# Patient Record
Sex: Female | Born: 1955
Health system: Southern US, Community
[De-identification: ages and names within clinical notes are randomized; demographics above are authoritative.]

## PROBLEM LIST (undated history)

## (undated) DIAGNOSIS — M069 Rheumatoid arthritis, unspecified: Secondary | ICD-10-CM

## (undated) DIAGNOSIS — F419 Anxiety disorder, unspecified: Secondary | ICD-10-CM

## (undated) DIAGNOSIS — M1990 Unspecified osteoarthritis, unspecified site: Secondary | ICD-10-CM

## (undated) DIAGNOSIS — Z87442 Personal history of urinary calculi: Secondary | ICD-10-CM

## (undated) DIAGNOSIS — R1900 Intra-abdominal and pelvic swelling, mass and lump, unspecified site: Secondary | ICD-10-CM

## (undated) DIAGNOSIS — K219 Gastro-esophageal reflux disease without esophagitis: Secondary | ICD-10-CM

## (undated) HISTORY — DX: Personal history of urinary calculi: Z87.442

## (undated) HISTORY — PX: EXPLORATORY LAPAROTOMY: SUR591

## (undated) HISTORY — DX: Unspecified osteoarthritis, unspecified site: M19.90

## (undated) HISTORY — PX: BACK SURGERY: SHX140

## (undated) HISTORY — DX: Anxiety disorder, unspecified: F41.9

## (undated) HISTORY — DX: Gastro-esophageal reflux disease without esophagitis: K21.9

## (undated) HISTORY — DX: Intra-abdominal and pelvic swelling, mass and lump, unspecified site: R19.00

## (undated) HISTORY — PX: APPENDECTOMY: SHX54

## (undated) HISTORY — PX: ABDOMINAL HYSTERECTOMY: SHX81

## (undated) HISTORY — PX: GASTRIC BYPASS: SHX52

## (undated) HISTORY — PX: LITHOTRIPSY: SUR834

## (undated) HISTORY — PX: KNEE ARTHROSCOPY: SUR90

## (undated) HISTORY — PX: CHOLECYSTECTOMY: SHX55

## (undated) HISTORY — DX: Rheumatoid arthritis, unspecified: M06.9

---

## 1997-09-01 ENCOUNTER — Inpatient Hospital Stay (HOSPITAL_COMMUNITY): Admission: RE | Admit: 1997-09-01 | Discharge: 1997-09-02 | Payer: Self-pay | Admitting: Neurosurgery

## 1997-10-20 ENCOUNTER — Ambulatory Visit (HOSPITAL_COMMUNITY): Admission: RE | Admit: 1997-10-20 | Discharge: 1997-10-20 | Payer: Self-pay | Admitting: Neurosurgery

## 1997-10-21 ENCOUNTER — Inpatient Hospital Stay (HOSPITAL_COMMUNITY): Admission: AD | Admit: 1997-10-21 | Discharge: 1997-11-01 | Payer: Self-pay | Admitting: Neurosurgery

## 1997-11-25 ENCOUNTER — Encounter: Admission: RE | Admit: 1997-11-25 | Discharge: 1997-11-25 | Payer: Self-pay | Admitting: Internal Medicine

## 1997-12-27 ENCOUNTER — Encounter: Admission: RE | Admit: 1997-12-27 | Discharge: 1997-12-27 | Payer: Self-pay | Admitting: Internal Medicine

## 1998-01-19 ENCOUNTER — Encounter: Admission: RE | Admit: 1998-01-19 | Discharge: 1998-01-19 | Payer: Self-pay | Admitting: Internal Medicine

## 1998-04-22 ENCOUNTER — Ambulatory Visit (HOSPITAL_COMMUNITY): Admission: RE | Admit: 1998-04-22 | Discharge: 1998-04-22 | Payer: Self-pay | Admitting: Neurosurgery

## 1999-08-17 ENCOUNTER — Ambulatory Visit: Admission: RE | Admit: 1999-08-17 | Discharge: 1999-08-17 | Payer: Self-pay | Admitting: Family Medicine

## 2000-11-27 ENCOUNTER — Encounter: Admission: RE | Admit: 2000-11-27 | Discharge: 2000-11-27 | Payer: Self-pay | Admitting: Family Medicine

## 2000-11-27 ENCOUNTER — Encounter: Payer: Self-pay | Admitting: Family Medicine

## 2001-01-22 ENCOUNTER — Ambulatory Visit (HOSPITAL_COMMUNITY): Admission: RE | Admit: 2001-01-22 | Discharge: 2001-01-22 | Payer: Self-pay | Admitting: Gastroenterology

## 2001-01-22 ENCOUNTER — Encounter: Payer: Self-pay | Admitting: Gastroenterology

## 2002-08-26 ENCOUNTER — Other Ambulatory Visit: Admission: RE | Admit: 2002-08-26 | Discharge: 2002-08-26 | Payer: Self-pay | Admitting: Gynecology

## 2003-07-12 ENCOUNTER — Encounter: Admission: RE | Admit: 2003-07-12 | Discharge: 2003-07-12 | Payer: Self-pay | Admitting: Internal Medicine

## 2003-10-03 ENCOUNTER — Other Ambulatory Visit: Admission: RE | Admit: 2003-10-03 | Discharge: 2003-10-03 | Payer: Self-pay | Admitting: Gynecology

## 2004-07-26 ENCOUNTER — Ambulatory Visit: Payer: Self-pay | Admitting: Internal Medicine

## 2004-07-26 ENCOUNTER — Encounter: Admission: RE | Admit: 2004-07-26 | Discharge: 2004-10-24 | Payer: Self-pay | Admitting: Surgery

## 2004-07-26 ENCOUNTER — Ambulatory Visit (HOSPITAL_BASED_OUTPATIENT_CLINIC_OR_DEPARTMENT_OTHER): Admission: RE | Admit: 2004-07-26 | Discharge: 2004-07-26 | Payer: Self-pay | Admitting: Surgery

## 2004-07-27 ENCOUNTER — Ambulatory Visit (HOSPITAL_COMMUNITY): Admission: RE | Admit: 2004-07-27 | Discharge: 2004-07-27 | Payer: Self-pay | Admitting: Surgery

## 2004-08-01 ENCOUNTER — Ambulatory Visit: Admission: RE | Admit: 2004-08-01 | Discharge: 2004-08-01 | Payer: Self-pay | Admitting: Surgery

## 2004-08-01 ENCOUNTER — Encounter: Payer: Self-pay | Admitting: Cardiovascular Disease

## 2004-08-01 ENCOUNTER — Ambulatory Visit: Payer: Self-pay | Admitting: Cardiovascular Disease

## 2004-10-02 ENCOUNTER — Inpatient Hospital Stay (HOSPITAL_COMMUNITY): Admission: RE | Admit: 2004-10-02 | Discharge: 2004-10-05 | Payer: Self-pay | Admitting: Surgery

## 2004-11-15 ENCOUNTER — Encounter: Admission: RE | Admit: 2004-11-15 | Discharge: 2005-02-13 | Payer: Self-pay | Admitting: Surgery

## 2004-11-16 ENCOUNTER — Other Ambulatory Visit: Admission: RE | Admit: 2004-11-16 | Discharge: 2004-11-16 | Payer: Self-pay | Admitting: Gynecology

## 2005-03-26 ENCOUNTER — Encounter: Admission: RE | Admit: 2005-03-26 | Discharge: 2005-06-24 | Payer: Self-pay | Admitting: Surgery

## 2005-11-19 ENCOUNTER — Other Ambulatory Visit: Admission: RE | Admit: 2005-11-19 | Discharge: 2005-11-19 | Payer: Self-pay | Admitting: Gynecology

## 2007-04-14 ENCOUNTER — Other Ambulatory Visit: Admission: RE | Admit: 2007-04-14 | Discharge: 2007-04-14 | Payer: Self-pay | Admitting: Gynecology

## 2007-04-15 ENCOUNTER — Encounter: Admission: RE | Admit: 2007-04-15 | Discharge: 2007-04-15 | Payer: Self-pay | Admitting: Gynecology

## 2007-05-05 ENCOUNTER — Encounter (INDEPENDENT_AMBULATORY_CARE_PROVIDER_SITE_OTHER): Payer: Self-pay | Admitting: Gynecology

## 2007-05-05 ENCOUNTER — Inpatient Hospital Stay (HOSPITAL_COMMUNITY): Admission: RE | Admit: 2007-05-05 | Discharge: 2007-05-07 | Payer: Self-pay | Admitting: Gynecology

## 2007-09-21 ENCOUNTER — Encounter: Admission: RE | Admit: 2007-09-21 | Discharge: 2007-09-21 | Payer: Self-pay | Admitting: Occupational Medicine

## 2007-11-04 ENCOUNTER — Encounter: Admission: RE | Admit: 2007-11-04 | Discharge: 2007-11-04 | Payer: Self-pay | Admitting: Rheumatology

## 2008-07-05 ENCOUNTER — Encounter: Admission: RE | Admit: 2008-07-05 | Discharge: 2008-07-05 | Payer: Self-pay | Admitting: Gynecology

## 2010-04-18 ENCOUNTER — Encounter: Admission: RE | Admit: 2010-04-18 | Discharge: 2010-04-18 | Payer: Self-pay | Admitting: Occupational Medicine

## 2010-07-13 ENCOUNTER — Other Ambulatory Visit: Payer: Self-pay | Admitting: Gynecology

## 2010-07-13 DIAGNOSIS — Z1231 Encounter for screening mammogram for malignant neoplasm of breast: Secondary | ICD-10-CM

## 2010-07-20 ENCOUNTER — Ambulatory Visit
Admission: RE | Admit: 2010-07-20 | Discharge: 2010-07-20 | Disposition: A | Discharge status: Home / Self Care | Payer: Managed Care, Other (non HMO) | Source: Ambulatory Visit | Attending: Gynecology | Admitting: Gynecology

## 2010-07-20 DIAGNOSIS — Z1231 Encounter for screening mammogram for malignant neoplasm of breast: Secondary | ICD-10-CM

## 2010-10-02 NOTE — H&P (Signed)
NAME:  Cassidy Reeves, Cassidy Reeves                 ACCOUNT NO.:  [id number]   MEDICAL RECORD NO.:  [id number]          PATIENT TYPE:  INP   LOCATION:  1523                         FACILITY:  [facility]   PHYSICIAN:  Gretta Cool, M.D. DATE OF BIRTH:  03-Mar-1956   DATE OF ADMISSION:  05/05/2007  DATE OF DISCHARGE:  04/28/2007                              HISTORY & PHYSICAL   CHIEF COMPLAINT:  Pelvic mass.   HISTORY OF PRESENT ILLNESS:  A 55 year old G2, P2 presented for annual  gynecologic examination, was noted to have a pelvic mass.  Ultrasound  examination revealed a complex pelvic mass with multiple loculations  measuring 6.8 x 41 x 66 cm.  The mass appeared to be smoothly marginated  with no evidence of excrescences or anything compatible with advanced  ovarian pathology.  CA-125 was determined to be 10.2.  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   In retrospect, the patient recalls some history of pelvic pressure for  at least six months duration.  She denies any other symptoms.  She has  no evidence of ascites or ay other secondary evidence of malignancy.  She is now admitted for definitive therapy by laparotomy, possible  omentectomy, possible lymph node sampling.  She understands that she may  need to be seen or may need to have laparoscopic lymphadenectomy in the  event that this should be more than a benign process.   PAST MEDICAL HISTORY:  1. Usual childhood disease without sequelae  2. History of morbid obesity.  3. History of gastric bypass.  Has lost greater than 100 pounds from      her prebypass weight of approximately 283 pounds.  She has no other      significant medical history.   PAST OBSTETRICS HISTORY:  Two vaginal deliveries, uneventful.   FAMILY HISTORY:  Father has pacemaker, psoriasis, arthritis,  Parkinson's.  Mom is living and well.  Paternal grandfather had colon  cancer.  Maternal grandmother type 2 diabetes.  No other known  familial  tendencies.   HABITS:  Denies ethanol or tobacco.  She is on low-dose hormone  replacement with Climara patch.  She is on Enbrel, autoimmune  suppression for psoriasis.   REVIEW OF SYSTEMS:  HEENT:  Denies symptoms.  CARDIORESPIRATORY:  Denies  asthma, cough, bronchitis, shortness of breath.  GI:  Denies frequency,  urgency, dysuria, change in bowel habits, food intolerance.   PHYSICAL EXAMINATION:  GENERAL:  Well-developed, well-nourished white  female.  VITAL SIGNS:  Current weight of 176 pounds, BMI 29.  HEENT:  Pupils equal, round and reactive to light and accommodations.  Fundi not examined.  Oropharynx clear.  NECK:  Supple without mass or thyroid enlargement.  CHEST:  Clear to percussion and auscultation.  HEART:  Regular rate and rhythm without murmur or cardiac enlargement.  BREASTS:  Without mass, nodes or nipple discharge.  ABDOMEN:  Soft without mass or organomegaly.  Laparoscopic scar is  noted.  PELVIC:  External genitalia normal female vagina, diminished estrogen  effect.  Cervix and uterus surgically absent.  There  is a palpable mass  in the right lower quadrant that is approximately 6 cm in dimension  Ultrasound revealed a mass above with multiple internal loculations  compatible with mucinous cyst adenoma.  No free fluid.  No other  evidence of extent.  The right to vaginal confirms.  EXTREMITIES:  Negative in neurologic and physiologic.   IMPRESSION:  1. 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  2. Psoriasis on Enbrel immunosuppressive therapy.  3. History of morbid obesity, now post gastric bypass with 130 pound      weight loss.  4. History of hysterectomy and left salpingo-oophorectomy, Burch      posterior and enterocele  repairs in 1995.           ______________________________  Gretta Cool, M.D.     CWL/MEDQ  D:  05/05/2007  T:  05/05/2007   Job:  [id number]   cc:   Gaspar Garbe, M.D.  Fax: [phone]

## 2010-10-02 NOTE — Op Note (Signed)
NAME:  Cassidy Reeves, Cassidy Reeves                 ACCOUNT NO.:  [id number]   MEDICAL RECORD NO.:  [id number]          PATIENT TYPE:  INP   LOCATION:  1523                         FACILITY:  [facility]   PHYSICIAN:  Gretta Cool, M.D. DATE OF BIRTH:  1955/12/31   DATE OF PROCEDURE:  05/05/2007  DATE OF DISCHARGE:                               OPERATIVE REPORT   PREOPERATIVE DIAGNOSIS:  Right ovarian mass.   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   PROCEDURE:  1. Exploratory laparotomy.  2. Lysis of adhesions.  3. Right salpingo-oophorectomy.  4. Pathology consult and frozen section.   SURGEON:  Beather Arbour, MD   ASSISTANT:  Jeanine Luz, MD   ANESTHESIA:  General orotracheal.   DESCRIPTION OF PROCEDURE:  Under excellent general anesthesia, with the  patient prepped and draped in supine position, with her bladder drained  by a Foley catheter.  A submental incision was made from the umbilicus  to the symphysis.  The incision was then extended through the fascia,  the peritoneum opened and the abdomen explored.  There were no  abnormalities identified in the upper abdomen, no evidence of omental  spread of metastasis.  Peritoneal washings were obtained, but not  submitted after the frozen section.  At this point, the retractors were  placed, the bowel packed away and the right adnexal structure  identified.  It was significantly adherent to the right lateral pelvic  wall and to the pelvic floor.  Procedure was begun by transecting the  right round ligament to which the ovary was attached by cautery.  The  peritoneum was then opened and the infundibulopelvic vessels exposed.  The blood vessels were then clamped, cut, sutured and tied with 0  Vicryl.  A 2nd free tie was used to doubly ligate pedicle.  At this  point, the cautery was used to transect the adhesions adherent to the  ovarian mass.  Once the adhesions were sufficiently lysed and the mass  removed, the tissue was  submitted for pathology consult.  At this point,  all the bleeding points were coagulated and the peritoneal defect  closed, but with running suture of #2-0 Monocryl.  At this point, the  frozen section report was returned that revealed a thecoma of the right  ovary, but no evidence of malignancy.  At this point, the packs and  retractors were removed.  The peritoneum was closed with a running  suture of #0 Monocryl.  The fascia was approximated then with a running  suture of #0 PDS from each angle to the midline.  The subcutaneous  tissues were approximated then including Scarpa's fascia with  interrupted sutures of 3-0 Vicryl and skin closed with skin staples and  Steri-Strips.  At the end of the procedure, sponge and lap counts were  correct.  There were no complications.  The patient returned to the  recovery room in excellent condition.   TISSUE REMOVED:  Right tube and ovarian mass.   FROZEN SECTION:  Thecoma.   COMPLICATIONS:  None.   ESTIMATED BLOOD LOSS:  Minimal.  ______________________________  Gretta Cool, M.D.     CWL/MEDQ  D:  05/05/2007  T:  05/06/2007  Job:  [id number]   cc:   Almedia Balls. Randell Patient, M.D.  Fax: [phone]   Gaspar Garbe, M.D.  Fax: [phone]

## 2010-10-05 NOTE — Discharge Summary (Signed)
NAME:  AVYNN, KLASSEN NO.:  [id number]   MEDICAL RECORD NO.:  [id number]          PATIENT TYPE:  INP   LOCATION:  1523                         [facility]ACILITY:  [facility]   PHYSICIAN:  Gretta Cool, M.D. DATE OF BIRTH:  October 19, 1955   DATE OF ADMISSION:  05/05/2007  DATE OF DISCHARGE:  05/07/2007                               DISCHARGE SUMMARY   HISTORY OF PRESENT ILLNESS:  Ms. Cassidy Reeves is a 55 year old white female  gravida 2, para 2 who on her annual gynecologic exam was noted to have a  pelvic mass.  The ultrasound evaluation measured this complex pelvic  mass with multiple loculations at 6.8 x 41 x 66 cm.  The mass appeared  to be smooth with no evidence of anything compatible with advanced  ovarian pathology.  CA-125 was 10.2.  The appearance appears to be  compatible with a mucinous cyst adenoma.   REVIEW OF SYSTEMS:  The patient recalls pelvic pressure for about 6  months.  She denies any other symptoms.   PHYSICAL EXAMINATION:  GENERAL:  There was no evidence of ascites.  She  is now admitted for definitive therapy by laparotomy and possible  omentectomy, possible lymph node sampling.  CHEST:  Clear to A & P.  HEART:  Rate and rhythm were regular without murmur, gallop, cardiac  enlargement.  BREASTS:  Soft without masses.  No nipple  discharge.  ABDOMEN:  Soft without masses or organomegaly.  Laparoscopic scar is  noted.  PELVIC:  External genitalia within normal limits for female with  diminished estrogen effect.  The cervix and uterus are surgically  absent.  There is a palpable mass in the right lower quadrant that is  approximately 6 cm in dimension.  Left adnexa was clear.  Rectovaginal  exam confirms.   IMPRESSION:  1. 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  2. Psoriasis on Enbrel for immunosuppressive therapy.  3. History of morbid obesity, now post gastric bypass  surgery with a      130-pound weight loss.  4. History of hysterectomy and left salpingo-oophorectomy.  Posterior      enterocele was repaired in 1995.  Risks and benefits were discussed      with the patient and she accepts these procedures.   LABORATORY DATA:  On admission, hemoglobin 12.9, hematocrit 36.5.  On  the first postoperative day, hemoglobin was 12.0, hematocrit 34.1.  Remainder of her preoperative labs were within normal limits.  ECG  showed a ventricular rate of 69.   HOSPITAL COURSE:  The patient underwent exploratory laparotomy with  lysis of adhesions and right salpingo-oophorectomy under general  anesthesia.  The procedures were completed without any complications and  the patient was returned to the recovery room in excellent condition.  Pathology report showed a hemorrhagic corpus luteal cyst and hemorrhagic  follicular cyst on the left ovary, unremarkable fallopian tube,  edematous ovarian stroma.  Malignant features were not identified.  Rapid intraoperative diagnosis of right ovary.  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  No evidence of  carcinoma.  Her postoperative course was without complications, and the  patient was discharged on the second postoperative day in excellent  condition.  Final discharge instructions included no heavy lifting or  straining, no vaginal entrance, increase ambulation as tolerated.  She  is to call for any fever over 100.4 or failure of daily improvement.  She may shower or tub bath every 72 hours.   MEDICATIONS:  1. She may resume any preoperative medications.  2. Celebrex 200 mg one tablet daily.  3. Percocet one tablet 5/325 q.3-4 h p.r.n. discomfort.  4. Continue estrogen patch as prescribed.   FOLLOWUP:  She is to return to the office in 5 days for follow-up.   CONDITION ON DISCHARGE:  Excellent.   DISCHARGE DIAGNOSIS:  Pelvic mass - thecoma by frozen section.   PROCEDURES PERFORMED:   Exploratory laparotomy, lysis of adhesions, right  salpingo-oophorectomy, pathology consult and frozen section.      Matt Holmes, N.P.    ______________________________  Gretta Cool, M.D.    EMK/MEDQ  D:  07/08/2007  T:  07/09/2007  Job:  [id number]

## 2010-10-05 NOTE — Discharge Summary (Signed)
NAME:  Cassidy Reeves, Cassidy Reeves                 ACCOUNT NO.:  [id number]   MEDICAL RECORD NO.:  [id number]          PATIENT TYPE:  INP   LOCATION:  0163                         FACILITY:  [facility]   PHYSICIAN:  Thornton Park. Daphine Deutscher, MD  DATE OF BIRTH:  1955-11-15   DATE OF ADMISSION:  10/02/2004  DATE OF DISCHARGE:                                 DISCHARGE SUMMARY   ADMISSION DIAGNOSES:  Morbid obesity with BMI approximately 47.   DISCHARGE DIAGNOSES:  1.  Morbid obesity with BMI approximately 47.  2.  Status post laparoscopic roux-Y gastric bypass, 40 cm biliopancreatic      limb, 100 cm antecolic roux limb with candy cane left and closure of      Pearson's defect.   HOSPITAL COURSE:  [name] is a 55 year old white female who underwent  the above mentioned operation on Oct 02, 2004 from which she seemed to do  well. She had a drain left in. Because of bed availability, she ended up  staying in the step down unit the whole time. Her swallow showed that pouch  emptied although it was a little emptying. She complained of some left upper  quadrant pain which went away when I removed her drain which had migrated  beneath her left hemidiaphragm. She was given Roxicet for pain. She was  ready for discharge on Friday, Oct 05, 2004. She was asked to return on  Monday for staple removal as I just removed her drain.   CONDITION ON DISCHARGE:  Good.      MBM/MEDQ  D:  10/05/2004  T:  10/05/2004  Job:  [id number]   cc:   Gretta Cool, M.D.  [address] [place]  [place] [address]  Fax: [phone]   Gaspar Garbe, M.D.  [address]  [place]  [place] [address]  Fax: [phone]

## 2010-10-05 NOTE — Procedures (Signed)
NAME:  Cassidy Reeves, Cassidy Reeves                 ACCOUNT NO.:  [id number]   MEDICAL RECORD NO.:  [id number]          PATIENT TYPE:  OUT   LOCATION:  SLEEP CENTER                 FACILITY:  [facility]   PHYSICIAN:  Clinton D. Maple Hudson, M.D. DATE OF BIRTH:  [birth year]   DATE OF STUDY:  07/27/2004                              NOCTURNAL POLYSOMNOGRAM   REFERRING PHYSICIAN:  Thornton Park. Daphine Deutscher, MD   INDICATIONS FOR STUDY:  Hypersomnia with sleep apnea. Epworth sleepiness  score 5/24, BMI 45, weight 282 pounds. Neck size 18 inches.   SLEEP ARCHITECTURE:  Total sleep time 268 minutes with sleep efficiency of  63%. Stage I was 23%, stage II was 59%, stages III and IV were 9%, REM was  9% of total sleep time. Latency to sleep onset 14 minutes. Latency to REM  173 minutes. Awake after sleep onset 142 minutes. Arousal index 23.   RESPIRATORY DATA:  Split study protocol. Respiratory disturbance index (RDI)  52.4 obstructive events per hour indicating severe obstructive sleep  apnea/hypopnea before CPAP. There were 7 obstructive apneas and 103  hypopneas before CPAP. The events were not positional. REM RDI was 10. CPAP  was titrated to 10 CWP, RDI 8.1 per hour. A medium Respironics Comfort Full  Face mask was used. Sleep was fragmented and she had difficulty maintaining  sleep initially on CPAP, which limited time for titration.   OXYGEN DATA:  Mild snoring with oxygen desaturation to a nadir of 79% before  CPAP. After CPAP control, oxygen saturation held 93% to 96% on room air.   CARDIAC DATA:  Normal sinus rhythm with rare PVC.   MOVEMENT/PARASOMNIA:  A total of 64 limb jerks were recorded of which 10  were associated with arousal or awakening of a periodic limb movement with  arousal index of 2.2 per hour which is mildly increased.   IMPRESSION/RECOMMENDATIONS:  1.  Severe obstructive sleep apnea/hypopnea syndrome, RDI 52.4 per hour with      oxygen desaturation to 79%.  2.  CPAP titration to 10 CWP, RDI 8.1  per hour using a medium Respironics      Comfort Full Face mask with heated humidifier. She had some anxiety and      difficulty initially adjusting to CPAP and may benefit from a      sleep medication during initial home use.  3.  Mild periodic limb movement with arousal, 2.2 per hour.      CDY/MEDQ  D:  07/29/2004 11:10:46  T:  07/30/2004 09:37:52  Job:  [id number]

## 2010-10-05 NOTE — Procedures (Signed)
[place]  Patient:    Cassidy Reeves, Cassidy Reeves. Visit Number: [id number] MRN: [id number]          Service Type: Attending:  Verlin Grills, M.D. Dictated by:   Verlin Grills, M.D. Proc. Date: 01/22/01   CC:         Al Decant. Janey Greaser, M.D.  Gretta Cool, M.D.   Procedure Report  DATE OF BIRTH:  Sep 30, 1955  REFERRING PHYSICIAN: 1. Al Decant. Janey Greaser, M.D. 2. Gretta Cool, M.D.  PROCEDURE PERFORMED:  Flexible proctosigmoidoscopy.  ENDOSCOPIST:  Verlin Grills, M.D.  INDICATIONS FOR PROCEDURE:  The patient is a 55 year old female with chronic left lower quadrant abdominal pain.  Cassidy Reeves has undergone a cholecystectomy, appendectomy, total abdominal hysterectomy, and left oophorectomy in the past.  When Cassidy Reeves developed left lower quadrant abdominal pain she was evaluated by Dr. Beather Arbour and underwent a transvaginal ultrasound; she was told the ultrasound was normal.  On December 01, 2000 her CBC with differential was normal. Her November 27, 2000 CT scan of the abdomen and pelvis was normal, post-hysterectomy and left oophorectomy.  Cassidy Reeves adolescent son has irritable bowel syndrome and she is quite familiar with the symptoms of irritable bowel syndrome.  She does not have similar symptoms.  Her pain is always in the left lower quadrant and left flank area.  The pain is not brought on by food intake and is not associated with bowel urgency, diarrhea, constipation.  Her pain is not relieved with bowel movement.  MEDICATION ALLERGIES:  PENICILLIN.  CURRENT MEDICATIONS:  Prozac and clorazepate.  PAST MEDICAL HISTORY:  Psoriasis, kidney stones, back surgery, total abdominal hysterectomy with left oophorectomy, cholecystectomy, appendectomy, dislocated left elbow.  PREMEDICATION:  Versed 10 mg, fentanyl 100 mcg.  ENDOSCOPE:  Olympus video pediatric colonoscope.  DESCRIPTION OF PROCEDURE:  After  obtaining informed consent, the patient was placed in the left lateral decubitus position.  I administered intravenous fentanyl and intravenous Versed to achieve conscious sedation for the procedure.  The patients blood pressure, oxygen saturation and cardiac rhythm were monitored throughout the procedure and documented in the medical record.  Anal inspection was normal.  Digital rectal exam was normal.  The Olympus pediatric video colonoscope was then introduced into the rectum advanced to approximately 30 cm from the anal verge.  Due to sigmoid colon loop formation, I was unable to advance the endoscope more proximally and a complete colonoscopy was not performed.  Endoscopic appearance of the rectum and distal sigmoid colon with the endoscope advanced to 30 cm from the anal verge was completely normal.  There was no endoscopic evidence for the presence of colorectal neoplasia or inflammatory bowel disease.  ASSESSMENT:  Normal flexible proctosigmoidoscopy performed to 30 cm from the anal verge.  PLAN:  I will schedule the patient for an air contrast barium enema to be performed at [facility] later this morning.  I suspect her gastrointestinal evaluation will be normal.  I have no explanation for her left lower quadrant abdominal pain.  The next step might require a diagnostic laparoscopy with adhesion lysis. Dictated by:   Verlin Grills, M.D. Attending:  Verlin Grills, M.D. DD:  01/22/01 TD:  01/22/01 Job: [id number]

## 2010-10-05 NOTE — Op Note (Signed)
NAME:  Cassidy Reeves, Cassidy Reeves                 ACCOUNT NO.:  [id number]   MEDICAL RECORD NO.:  [id number]          PATIENT TYPE:  INP   LOCATION:  0001                         FACILITY:  [facility]   PHYSICIAN:  Thornton Park. Daphine Deutscher, MD  DATE OF BIRTH:  [birth year]   DATE OF PROCEDURE:  10/02/2004  DATE OF DISCHARGE:                                 OPERATIVE REPORT   PREOPERATIVE DIAGNOSIS:  Morbid obesity.   POSTOPERATIVE DIAGNOSIS:  Morbid obesity.   PROCEDURE:  Laparoscopic Roux-en-Y gastric bypass, with 40 cm  biliopancreatic limb and 100 cm Roux limb, antecolic candy cane left, with  closure of Peterson's defect.   SURGEON:  Thornton Park. Daphine Deutscher, M.D.   ASSISTANT:  Sandria Bales. Ezzard Standing, M.D.   ANESTHESIA:  General endotracheal.   DRAINS:  One JP.   DESCRIPTION OF PROCEDURE:  Jamilet Ambroise is a 55 year old lady.  Extensive  informed consent was obtained regarding laparoscopic as well as open gastric  bypass.  She has had a previous open cholecystectomy and a vaginal  hysterectomy.  I entered the abdomen through the left upper quadrant without  difficulty and encountered a large massive sea of omental fat that was stuck  up all along her previous cholecystectomy incision and through her drain  site in the right lower quadrant.  I placed two other trocars, one just to  the left of the umbilicus for the camera, and through that I then used a  Harmonic to try to take down these and free this up.  I took down the  omentum all the way over to the gallbladder, where it was stuck up into the  gallbladder fossa.  I then placed two trocars in the right upper quadrant  and began first trying to mobilize the omentum.  The omentum was massive and  really completely obscured the small bowel.  I was able to pull that up, and  there work my way back in front of the ligament of Treitz which I identified  on two occasions.  I went forward 40 cm and divided the small bowel with two  applications of the Endo-GIA 60.  I  put a Penrose drain on the Roux limb  distal portion of this division and then held the biliopancreatic limb while  I counted 100 cm downstream.  There, I lay the biliopancreatic limb parallel  to the Roux limb and tacked them with a suture.  I opened them along their  antimesenteric edges and then inserted an Endo-GIA 60 with a white cartridge  and fired this.  The common defect was closed with two sutures of 4-0  Vicryl, and then a middle stitch was used in an area where I thought there  might be a little bit of an opening.  The entire suture line was later  coated with Tisseel.  The mesenteric defect was closed from the bowel down  closing the mesenteric with running 2-0 silk tied at both ends unto itself.   Attention was then directed toward the upper foregut.  The Nathanson  retractor was placed, and then I dissected up near the  cardia to create a  window there.  I measured about 4 cm along the lesser curvature and then  dissected in the retrogastric space.  I then applied Endo-GIA 60 using the  bronze cartridge.  I then created multiple other firings to create the  pouch, eventually winding up at the site that I had previously dissected  near the cardia.  I also passed an Ewald tube to help guide and direct this.  Clips were applied along the remnant, where there was a little bit of  oozing, and then Tisseel was applied.   Next, we brought up the Roux limb in an antecolic fashion, the candy cane  pointed to the left.  I placed a back wall suture of running 2-0 Vicryl,  tying it on the left side and then putting a Laparotie on the right side at  the end.   I then was able to open on either side and insert the Endo-GIA to about a 4  cm mark and fire to create an anastomosis.  The common defect was closed  with a running 2-0 Vicryl from either end.  I then reinforced the anterior  row with a running suture of 2-0 Vicryl using a Laparotie and a free needle.   Following completion of  the second layer, Dr. Ezzard Standing slipped the scope down  and endoscoped it under pressure.  There was no leak noted.  I then placed  Tisseel over the anastomosis.  I then put a JP drain in the upper abdomen.  All the port sites were not bleeding.  The JP was brought out through the  left side, sutured to the skin with nylon.  The abdomen was deflated, and  the wounds were all closed with staples.  The patient seemed to tolerate the  procedure well and was taken to the recovery room in satisfactory condition.      MBM/MEDQ  D:  10/02/2004  T:  10/02/2004  Job:  [id number]   cc:   Gaspar Garbe, M.D.  [address]  [place]  [place] [address]  Fax: [phone]   Gretta Cool, M.D.  [address] [place]  [place] [address]  Fax: [phone]

## 2010-10-05 NOTE — Op Note (Signed)
NAME:  GIAVONNI, CIZEK                 ACCOUNT NO.:  [id number]   MEDICAL RECORD NO.:  [id number]          PATIENT TYPE:  INP   LOCATION:  0001                         FACILITY:  [facility]   PHYSICIAN:  Sandria Bales. Ezzard Standing, M.D.  DATE OF BIRTH:  [birth year]   DATE OF PROCEDURE:  10/02/2004  DATE OF DISCHARGE:                                 OPERATIVE REPORT   PREOPERATIVE DIAGNOSIS:  Morbid obesity.   POSTOPERATIVE DIAGNOSIS:  Morbid obesity.   OPERATION PERFORMED:  Esophagogastrojejunostomy.   SURGEON:  Sandria Bales. Ezzard Standing, M.D.   ASSISTANT:  None.   ANESTHESIA:  General endotracheal.   ESTIMATED BLOOD LOSS:  Minimal.   INDICATIONS FOR PROCEDURE:  Ms. Munley is a 55 year old white female who has  a BMI of approximately 43, who has undergone a laparoscopic Roux-en-Y  gastric bypass by [place]. Daphine Deutscher, MD for morbid obesity.  I am doing an  upper endoscopy for documentation of the pouch size, anastomosis and  evidence of leak.   DESCRIPTION OF PROCEDURE:  The patient was under general anesthesia,  intubated.  Dr. Daphine Deutscher is manning the laparoscope and had flooded the upper  abdomen with saline and clamped off the small bowel.  I am passing a  flexible Olympus endoscope from above transorally.   The scope was passed down the esophagus into the stomach pouch.  The  gastrojejunostomy was visualized.  I  passed the scope about 1 cm through  the gastrojejunostomy.  The stomach pouch had viable mucosa.  Anastomosis  was widely patent. The bowel was insufflated with air.  Dr. Daphine Deutscher saw no  evidence of air leak.  The  gastrojejunostomy was at 46 cm from the incisors.  The esophagogastric  junction was at 40 cm for an approximate 6 cm gastric pouch.  The scope was  then withdrawn into the esophagus, unremarkable.   The patient tolerated the procedure well.  Dr. Daphine Deutscher will dictate the main  RYGB operative procedure.      DHN/MEDQ  D:  10/02/2004  T:  10/02/2004  Job:  [id number]   cc:   [place] Daphine Deutscher, MD  [address], [address]  [place]  [place] [address]

## 2011-02-22 LAB — BASIC METABOLIC PANEL
BUN: 9
CO2: 26
GFR calc Af Amer: >60
Potassium: 3.7
Sodium: 140

## 2011-02-22 LAB — HEMOGLOBIN AND HEMATOCRIT, BLOOD
HCT: 34.1 — ABNORMAL LOW
HCT: 36.5
Hemoglobin: 12

## 2011-11-04 ENCOUNTER — Encounter: Payer: Self-pay | Admitting: Gastroenterology

## 2011-11-25 ENCOUNTER — Telehealth: Payer: Self-pay

## 2011-11-25 NOTE — Telephone Encounter (Signed)
I tried to reach the pt at all numbers provided in the system.  Messages were left on each

## 2011-11-25 NOTE — Telephone Encounter (Signed)
Pt appt for 11/26/11 needs to be moved because of a case to be done at [facility]

## 2011-11-26 ENCOUNTER — Ambulatory Visit (INDEPENDENT_AMBULATORY_CARE_PROVIDER_SITE_OTHER): Payer: Managed Care, Other (non HMO) | Admitting: Gastroenterology

## 2011-11-26 ENCOUNTER — Encounter: Payer: Self-pay | Admitting: Gastroenterology

## 2011-11-26 VITALS — BP 134/76 | HR 68 | Ht 66.0 in | Wt 206.0 lb

## 2011-11-26 DIAGNOSIS — K3189 Other diseases of stomach and duodenum: Secondary | ICD-10-CM

## 2011-11-26 DIAGNOSIS — R1013 Epigastric pain: Secondary | ICD-10-CM

## 2011-11-26 DIAGNOSIS — R142 Eructation: Secondary | ICD-10-CM

## 2011-11-26 DIAGNOSIS — R143 Flatulence: Secondary | ICD-10-CM

## 2011-11-26 NOTE — Telephone Encounter (Signed)
Pt has been moved to 315 this afternoon pt aware

## 2011-11-26 NOTE — Progress Notes (Signed)
HPI: This is a   very pleasant 56 year old woman whom I am meeting for the first time today.  She woke up recently with terrible belching. This was about 2 months ago.  Has a gnawing senstation in abd.  "like any man."  Searched internet.    She does not get pyrosis.  Rare intermittent dysphagia.    Does not awaken her at night.  Drinks no carbonated drinks, does not use a straw.  Does not chew gum or candies very often.  Gastric bypass in 2006, Dr. Reinaldo Meeker en y.  She lost 130 pounds, gained 30 back but still 100 down.  She had colonoscopy in 2006, does not recall who did it, where it was done.   Review of systems: Pertinent positive and negative review of systems were noted in the above HPI section. Complete review of systems was performed and was otherwise normal.    Past Medical History  Diagnosis Date  . Pelvic mass   . Anxiety   . Arthritis   . GERD (gastroesophageal reflux disease)   . History of kidney stones     Past Surgical History  Procedure Date  . Gastric bypass   . Abdominal hysterectomy   . Exploratory laparotomy   . Cholecystectomy   . Appendectomy   . Lithotripsy   . Back surgery     Current Outpatient Prescriptions  Medication Sig Dispense Refill  . ALPRAZolam (XANAX) 0.5 MG tablet Uses as directed      . AMBULATORY NON FORMULARY MEDICATION Estradiol Patch Uses as directed      . ENBREL 50 MG/ML injection As directed      . zolpidem (AMBIEN) 10 MG tablet At bedtime as needed        Allergies as of 11/26/2011 - Review Complete 11/26/2011  Allergen Reaction Noted  . Dilaudid (hydromorphone hcl)  11/26/2011  . Penicillins  11/26/2011    Family History  Problem Relation Age of Onset  . Heart disease Father   . Arthritis Father   . Psoriasis Father   . Parkinsonism Father   . Colon cancer Paternal Grandfather     History   Social History  . Marital Status: Married    Spouse Name: N/A    Number of Children: 2  . Years of Education:  N/A   Occupational History  .     Social History Main Topics  . Smoking status: Never Smoker   . Smokeless tobacco: Never Used  . Alcohol Use: No  . Drug Use: No  . Sexually Active: Not on file   Other Topics Concern  . Not on file   Social History Narrative   Daily caffeine        Physical Exam: BP 134/76  Pulse 68  Ht 5\' 6"  (1.676 m)  Wt 206 lb (93.441 kg)  BMI 33.25 kg/m2 Constitutional: generally well-appearing Psychiatric: alert and oriented x3 Eyes: extraocular movements intact Mouth: oral pharynx moist, no lesions Neck: supple no lymphadenopathy Cardiovascular: heart regular rate and rhythm Lungs: clear to auscultation bilaterally Abdomen: soft, nontender, nondistended, no obvious ascites, no peritoneal signs, normal bowel sounds Extremities: no lower extremity edema bilaterally Skin: no lesions on visible extremities    Assessment and plan: 56 y.o. female with  significant belching for 2 months  This is an unusual symptom. She should not have any problems with acid reflux given her bariatric anatomy. Perhaps her room bariatric surgery is loosening up, the grading. I think she needs EGD for initial  evaluation. She does not have any air swallowing habits that I can detect during the interview today. She has had no medicine changes. She has not been on any antibiotics recently. She tried Gas-X without help. She tried a two-week trial of protime pump inhibitor and this did not help either.

## 2011-11-26 NOTE — Patient Instructions (Addendum)
You will be set up for an upper endoscopy for belching, dyspepsia.

## 2011-11-27 ENCOUNTER — Encounter: Payer: Self-pay | Admitting: Gastroenterology

## 2011-12-31 ENCOUNTER — Ambulatory Visit (AMBULATORY_SURGERY_CENTER): Payer: Managed Care, Other (non HMO) | Admitting: Gastroenterology

## 2011-12-31 ENCOUNTER — Encounter: Payer: Self-pay | Admitting: Gastroenterology

## 2011-12-31 VITALS — BP 129/76 | HR 61 | Temp 97.7°F | Resp 23 | Ht 66.0 in | Wt 206.0 lb

## 2011-12-31 DIAGNOSIS — R933 Abnormal findings on diagnostic imaging of other parts of digestive tract: Secondary | ICD-10-CM

## 2011-12-31 DIAGNOSIS — K319 Disease of stomach and duodenum, unspecified: Secondary | ICD-10-CM

## 2011-12-31 DIAGNOSIS — R1013 Epigastric pain: Secondary | ICD-10-CM

## 2011-12-31 DIAGNOSIS — R142 Eructation: Secondary | ICD-10-CM

## 2011-12-31 MED ORDER — SODIUM CHLORIDE 0.9 % IV SOLN: 500.0000 mL | INTRAVENOUS | Status: DC

## 2011-12-31 NOTE — Op Note (Signed)
[facility] [address] [place], [place]  [address]  ENDOSCOPY PROCEDURE REPORT  PATIENT:  Cassidy, Reeves  MR#:  [id number] BIRTHDATE:  [birth year], 55 yrs. old  GENDER:  female ENDOSCOPIST:  Rachael Fee, MD Referred by:  Guerry Bruin, M.D. PROCEDURE DATE:  12/31/2011 PROCEDURE:  EGD with biopsy, 43239 ASA CLASS:  Class II INDICATIONS:  excessive beclhing, dyspepsia; s/p roux en-y gastric bypass MEDICATIONS:   Fentanyl 50 mcg IV, These medications were titrated to patient response per physician's verbal order, Versed 5 mg IV TOPICAL ANESTHETIC:  Cetacaine Spray  DESCRIPTION OF PROCEDURE:   After the risks benefits and alternatives of the procedure were thoroughly explained, informed consent was obtained.  The [facility] GIF-H180 [id number] endoscope was introduced through the mouth and advanced to the second portion of the duodenum, without limitations.  The instrument was slowly withdrawn as the mucosa was fully examined. <<PROCEDUREIMAGES>> There was mild proximal gastritis and a single gastric erosion, biospies taken and sent to pathology (jar 1) (see image5). Typical, normal appearing roux-en/y gastric bypass (see image2, image3, image6, image1, and image7).    Retroflexed views revealed no abnormalities.    The scope was then withdrawn from the patient and the procedure completed. COMPLICATIONS:  None  ENDOSCOPIC IMPRESSION: 1) Small gastric erosion in gastric pouch 2) S/p roux-en/y gastric bypass  RECOMMENDATIONS: Await biopsy reports.  If + for H. pylori, will treat with appropriate antibiotics.  ______________________________ Rachael Fee, MD  n. eSIGNED:   Rachael Fee at 12/31/2011 03:32 PM  Pervis Hocking, [phone]

## 2011-12-31 NOTE — Patient Instructions (Addendum)
YOU HAD AN ENDOSCOPIC PROCEDURE TODAY AT THE [facility]: Refer to the procedure report that was given to you for any specific questions about what was found during the examination.  If the procedure report does not answer your questions, please call your gastroenterologist to clarify.  If you requested that your care partner not be given the details of your procedure findings, then the procedure report has been included in a sealed envelope for you to review at your convenience later.  YOU SHOULD EXPECT: Some feelings of bloating in the abdomen. Passage of more gas than usual.  Walking can help get rid of the air that was put into your GI tract during the procedure and reduce the bloating. If you had a lower endoscopy (such as a colonoscopy or flexible sigmoidoscopy) you may notice spotting of blood in your stool or on the toilet paper. If you underwent a bowel prep for your procedure, then you may not have a normal bowel movement for a few days.  DIET: Your first meal following the procedure should be a light meal and then it is ok to progress to your normal diet.  A half-sandwich or bowl of soup is an example of a good first meal.  Heavy or fried foods are harder to digest and may make you feel nauseous or bloated.  Likewise meals heavy in dairy and vegetables can cause extra gas to form and this can also increase the bloating.  Drink plenty of fluids but you should avoid alcoholic beverages for 24 hours.  ACTIVITY: Your care partner should take you home directly after the procedure.  You should plan to take it easy, moving slowly for the rest of the day.  You can resume normal activity the day after the procedure however you should NOT DRIVE or use heavy machinery for 24 hours (because of the sedation medicines used during the test).    SYMPTOMS TO REPORT IMMEDIATELY: A gastroenterologist can be reached at any hour.  During normal business hours, 8:30 AM to 5:00 PM Monday through Friday,  call [phone].  After hours and on weekends, please call the GI answering service at [phone] who will take a message and have the physician on call contact you.    Following upper endoscopy (EGD)  Vomiting of blood or coffee ground material  New chest pain or pain under the shoulder blades  Painful or persistently difficult swallowing  New shortness of breath  Fever of 100F or higher  Black, tarry-looking stools  FOLLOW UP: If any biopsies were taken you will be contacted by phone or by letter within the next 1-3 weeks.  Call your gastroenterologist if you have not heard about the biopsies in 3 weeks.  Our staff will call the home number listed on your records the next business day following your procedure to check on you and address any questions or concerns that you may have at that time regarding the information given to you following your procedure. This is a courtesy call and so if there is no answer at the home number and we have not heard from you through the emergency physician on call, we will assume that you have returned to your regular daily activities without incident.  SIGNATURES/CONFIDENTIALITY: You and/or your care partner have signed paperwork which will be entered into your electronic medical record.  These signatures attest to the fact that that the information above on your After Visit Summary has been reviewed and is understood.  Full   responsibility of the confidentiality of this discharge information lies with you and/or your care-partner.   Resume medications. Information given on gastritis with discharge instructions. 

## 2011-12-31 NOTE — Progress Notes (Signed)
Patient did not experience any of the following events: a burn prior to discharge; a fall within the facility; wrong site/side/patient/procedure/implant event; or a hospital transfer or hospital admission upon discharge from the facility. (G8907) Patient did not have preoperative order for IV antibiotic SSI prophylaxis. (G8918)  

## 2012-01-01 ENCOUNTER — Telehealth: Payer: Self-pay | Admitting: *Deleted

## 2012-01-01 NOTE — Telephone Encounter (Signed)
  Follow up Call-  Call back number 12/31/2011  Post procedure Call Back phone  # [phone] hm  Permission to leave phone message Yes     Patient questions:  Do you have a fever, pain , or abdominal swelling? no Pain Score  0 *  Have you tolerated food without any problems? yes  Have you been able to return to your normal activities? yes  Do you have any questions about your discharge instructions: Diet   no Medications  no Follow up visit  no  Do you have questions or concerns about your Care? no  Actions: * If pain score is 4 or above: No action needed, pain <4.

## 2012-01-07 ENCOUNTER — Other Ambulatory Visit: Payer: Self-pay

## 2012-01-07 MED ORDER — SUCRALFATE 1 GM/10ML PO SUSP: 1.0000 g | Freq: Two times a day (BID) | ORAL | Status: DC

## 2012-01-07 NOTE — Telephone Encounter (Signed)
Pt aware rx sent.  

## 2012-02-10 ENCOUNTER — Encounter: Payer: Self-pay | Admitting: Gastroenterology

## 2012-02-10 ENCOUNTER — Ambulatory Visit (INDEPENDENT_AMBULATORY_CARE_PROVIDER_SITE_OTHER): Payer: Managed Care, Other (non HMO) | Admitting: Gastroenterology

## 2012-02-10 VITALS — BP 132/70 | HR 72 | Ht 66.0 in | Wt 213.2 lb

## 2012-02-10 DIAGNOSIS — R1013 Epigastric pain: Secondary | ICD-10-CM

## 2012-02-10 NOTE — Patient Instructions (Addendum)
Upper GI with small bowel follow through, s/p Roux en y bypass; now with bothersome, persistent belching, any strictures?  You have been scheduled at [facility] Thursday 02-13-12 at 9:30 am arrive at 9:15. Nothing to eat or drink after midnight   Resume gas-ex.  One pill with every single meal.  OTC prilosec, prevacid (generic equivalent). One pill once daily 20-30 min before your lunch meal. Stop the carafate, it isn't helping.

## 2012-02-10 NOTE — Progress Notes (Signed)
Review of pertinent gastrointestinal problems: 1. Roux en y  gastric bypass by Dr. Daphine Deutscher 2006 2. dyspepsia, significant belching August 2013: EGD Dr. Christella Hartigan  August 2013 showed normal Roux-en-Y gastric bypass anatomy, the gastric remnant contained a single small erosion. Biopsies of the stomach were negative for H. pylori. She was started on Carafate twice daily   HPI: This is a   very pleasant 56 year old woman whom I last saw time of an upper endoscopy. See those results summarized above.  Still bothered by bothersome belching.  Has been on carafate twice daily for about a month, no difference.  This has now been going on for 2-3 months or so.  Past Medical History  Diagnosis Date  . Pelvic mass   . Anxiety   . Arthritis   . GERD (gastroesophageal reflux disease)   . History of kidney stones     Past Surgical History  Procedure Date  . Abdominal hysterectomy   . Exploratory laparotomy   . Cholecystectomy   . Appendectomy   . Lithotripsy   . Back surgery   . Gastric bypass     Current Outpatient Prescriptions  Medication Sig Dispense Refill  . ALPRAZolam (XANAX) 0.5 MG tablet Uses as directed      . AMBULATORY NON FORMULARY MEDICATION Estradiol Patch Uses as directed      . ENBREL 50 MG/ML injection As directed      . zolpidem (AMBIEN) 10 MG tablet At bedtime as needed      . sucralfate (CARAFATE) 1 GM/10ML suspension Take 10 mLs (1 g total) by mouth 2 (two) times daily.  420 mL  3    Allergies as of 02/10/2012 - Review Complete 02/10/2012  Allergen Reaction Noted  . Dilaudid (hydromorphone hcl)  11/26/2011  . Penicillins  11/26/2011    Family History  Problem Relation Age of Onset  . Heart disease Father   . Arthritis Father   . Psoriasis Father   . Parkinsonism Father   . Colon cancer Paternal Grandfather   . Esophageal cancer Neg Hx   . Rectal cancer Neg Hx   . Stomach cancer Neg Hx     History   Social History  . Marital Status: Married    Spouse  Name: N/A    Number of Children: 2  . Years of Education: N/A   Occupational History  .     Social History Main Topics  . Smoking status: Never Smoker   . Smokeless tobacco: Never Used  . Alcohol Use: Yes     occasio  . Drug Use: No  . Sexually Active: Not on file   Other Topics Concern  . Not on file   Social History Narrative   Daily caffeine       Physical Exam: BP 132/70  Pulse 72  Ht 5\' 6"  (1.676 m)  Wt 213 lb 3.2 oz (96.707 kg)  BMI 34.41 kg/m2 Constitutional: generally well-appearing Psychiatric: alert and oriented x3 Abdomen: soft, nontender, nondistended, no obvious ascites, no peritoneal signs, normal bowel sounds     Assessment and plan: 56 y.o. female with belching  This is a new problem of hers for the past 2-3 months. She's had really no dietary changes, no medicine changes that can account for it. She is going to try Gas-X with every single meal. She is going to try proton pump inhibitor once a day (even though she should not have a problem with acid reflux giving her bariatric anatomy).  I am stopping  her Carafate since if this has made no difference for the past month. She is also going to get an upper GI with small bowel follow-through. Perhaps she has distal narrowing. I think my next step would be empiric trial of non-absorbed antibiotics if the above measures are not helpful.

## 2012-02-13 ENCOUNTER — Ambulatory Visit (HOSPITAL_COMMUNITY)
Admission: RE | Admit: 2012-02-13 | Discharge: 2012-02-13 | Disposition: A | Discharge status: Home / Self Care | Payer: Managed Care, Other (non HMO) | Source: Ambulatory Visit | Attending: Gastroenterology | Admitting: Gastroenterology

## 2012-02-13 DIAGNOSIS — Z9884 Bariatric surgery status: Secondary | ICD-10-CM | POA: Insufficient documentation

## 2012-02-13 DIAGNOSIS — R142 Eructation: Secondary | ICD-10-CM | POA: Insufficient documentation

## 2012-02-13 DIAGNOSIS — R141 Gas pain: Secondary | ICD-10-CM | POA: Insufficient documentation

## 2012-02-13 DIAGNOSIS — R1013 Epigastric pain: Secondary | ICD-10-CM

## 2012-02-13 DIAGNOSIS — K449 Diaphragmatic hernia without obstruction or gangrene: Secondary | ICD-10-CM | POA: Insufficient documentation

## 2012-02-13 DIAGNOSIS — K219 Gastro-esophageal reflux disease without esophagitis: Secondary | ICD-10-CM | POA: Insufficient documentation

## 2012-02-19 ENCOUNTER — Telehealth: Payer: Self-pay | Admitting: Gastroenterology

## 2012-02-19 NOTE — Telephone Encounter (Signed)
  Left message on machine to call back   UGI/SBFT was normal. Ask about response to gas-ex and PPI once daily (started last week)

## 2012-02-19 NOTE — Telephone Encounter (Signed)
Pt says she feels some better not 100% she will continue current regimen and call if she worsens or does not improve

## 2012-07-20 ENCOUNTER — Other Ambulatory Visit: Payer: Self-pay | Admitting: Gynecology

## 2012-12-29 ENCOUNTER — Encounter (INDEPENDENT_AMBULATORY_CARE_PROVIDER_SITE_OTHER): Payer: Self-pay | Admitting: Surgery

## 2012-12-29 ENCOUNTER — Ambulatory Visit (INDEPENDENT_AMBULATORY_CARE_PROVIDER_SITE_OTHER): Payer: Commercial Indemnity | Admitting: Surgery

## 2012-12-29 VITALS — BP 114/72 | HR 74 | Temp 97.9°F | Resp 18 | Ht 66.0 in | Wt 219.0 lb

## 2012-12-29 DIAGNOSIS — Z9884 Bariatric surgery status: Secondary | ICD-10-CM

## 2012-12-29 NOTE — Patient Instructions (Signed)
Gastric Bypass Surgery Care After Refer to this sheet in the next few weeks. These discharge instructions provide you with general information on caring for yourself after you leave the hospital. Your caregiver may also give you specific instructions. Your treatment has been planned according to the most current medical practices available, but unavoidable complications sometimes occur. If you have any problems or questions after discharge, call your caregiver. HOME CARE INSTRUCTIONS  Activity  Take frequent walks throughout the day. This will help to prevent blood clots. Do not sit for longer than 45 minutes to 1 hour while awake for 4 to 6 weeks after surgery.  Continue to do coughing and deep breathing exercises once you get home. This will help to prevent pneumonia.  Do not do strenuous activities, such as heavy lifting, pushing, or pulling, until after your follow-up visit with your caregiver. Do not lift anything heavier than 10 lb (4.5 kg).  Talk with your caregiver about when you may return to work and your exercise routine.  Do not drive while taking prescription pain medicine. Nutrition  It is very important that you drink at least 80 oz (2,400 mL) of fluid a day.  You should stay on a clear liquid diet until your follow-up visit with your caregiver. Keep sugar-free, clear liquid items on hand, including:  Tea: hot or cold. Drink only decaffeinated for the first month.  Broths: clear beef, chicken, vegetable.  Others: water, sugar-free frozen ice pops, flavored water, gelatin (after 1 week).  Do not consume caffeine for 1 month. Large amounts of caffeine can cause dehydration.  A dietician may also give you specific instructions.  Follow your caregiver's recommendations about vitamins and protein requirements after surgery. Hygiene  You may shower and wash your hair 2 days after surgery. Pat incisions dry. Do not rub incisions with a washcloth or towel.  Follow your  caregiver's recommendations about baths and pools following surgery. Pain control  If a prescription medicine was given, follow your caregiver's directions.  You may feel some gas pain caused by the carbon dioxide used to inflate your abdomen during surgery. This pain can be felt in your chest, shoulder, back, or abdominal area. Moving around often is advised. Incision care  You may have 4 or more small incisions. They are closed with skin adhesive strips. Skin adhesive strips can get wet and will fall off on their own. Check your incisions and surrounding area daily for any redness, swelling, discoloration, fluid (drainage), or bleeding. Dark red, dried blood may appear under these coverings. This is normal.  If you have a drain, it will be removed at your follow-up visit or before you leave the hospital.  If your drain is left in, follow your caregiver's instructions on drain care.  If your drain is taken out, keep a clean, dry bandage over the drain site. SEEK MEDICAL CARE IF:   You develop persistent nausea and vomiting.  You have pain and discomfort with swallowing.  You have pain, swelling, or warmth in the lower extremities.  You have an oral temperature above 102 F (38.9 C).  You develop chills.  Your incision sites look red, swollen, or have drainage.  Your stool is black, tarry, or maroon in color.  You are lightheaded when standing.  You notice a bruise getting larger.  You have any questions or concerns. SEEK IMMEDIATE MEDICAL CARE IF:   You have chest pain.  You have severe calf pain or pain not relieved by medicine.  You   develop shortness of breath or difficulty breathing.  There is bright red blood coming from the drain.  You feel confused.  You have slurred speech.  You suddenly feel weak. MAKE SURE YOU:   Understand these instructions.  Will watch your condition.  Will get help right away if you are not doing well or get worse. Document  Released: 12/19/2003 Document Revised: 07/29/2011 Document Reviewed: 09/26/2009 Exit[employer] Patient Information 2014 [employer].  

## 2012-12-29 NOTE — Progress Notes (Signed)
Cassidy Reeves 57 y.o.  Body mass index is 35.36 kg/(m^2).  There are no active problems to display for this patient.   Allergies  Allergen Reactions  . Dilaudid (Hydromorphone Hcl)     Crazy hallucinations per the pt  . Penicillins     Breaks out and itches per the pt    Past Surgical History  Procedure Laterality Date  . Abdominal hysterectomy    . Exploratory laparotomy    . Cholecystectomy    . Appendectomy    . Lithotripsy    . Back surgery    . Gastric bypass     Gaspar Garbe, MD No diagnosis found.  I have not seen [name] since 2007 and I had performed a gastric bypass on her in May of 2006 so she is 8 years postop. She did developed over the last several months a lot of eructation that is in fairly large volume and very disconcerting. We talked about this could be related to reflux and could be related to the bacteria in her GI tract. Bacteria or probably a function of the diet that she is on. I have encouraged her to get back on her protein based and also to avoid artificial sweeteners. A 1 to see if this will make a difference in the volume of her burping and see if it will spot some weight loss. I will reassess her in 6 weeks. Was good to see her as she has done very well postop. Matt B. Daphine Deutscher, MD, [facility] [phone] beeper [phone]  12/29/2012 6:08 PM

## 2013-02-16 ENCOUNTER — Encounter (INDEPENDENT_AMBULATORY_CARE_PROVIDER_SITE_OTHER): Payer: Self-pay

## 2013-02-18 ENCOUNTER — Ambulatory Visit (INDEPENDENT_AMBULATORY_CARE_PROVIDER_SITE_OTHER): Payer: Commercial Indemnity | Admitting: Surgery

## 2013-02-18 ENCOUNTER — Other Ambulatory Visit (INDEPENDENT_AMBULATORY_CARE_PROVIDER_SITE_OTHER): Payer: Self-pay

## 2013-02-18 ENCOUNTER — Encounter (INDEPENDENT_AMBULATORY_CARE_PROVIDER_SITE_OTHER): Payer: Self-pay | Admitting: Surgery

## 2013-02-18 VITALS — BP 120/80 | HR 72 | Temp 96.9°F | Resp 14 | Ht 66.0 in | Wt 223.4 lb

## 2013-02-18 DIAGNOSIS — L4050 Arthropathic psoriasis, unspecified: Secondary | ICD-10-CM

## 2013-02-18 DIAGNOSIS — L409 Psoriasis, unspecified: Secondary | ICD-10-CM | POA: Insufficient documentation

## 2013-02-18 DIAGNOSIS — Z9884 Bariatric surgery status: Secondary | ICD-10-CM

## 2013-02-18 NOTE — Patient Instructions (Signed)
Followup with Cassidy Reeves to review bypass diet Add exercise to your daily routine.  Consider water aerobics if you are having arthritis issues.

## 2013-02-18 NOTE — Progress Notes (Signed)
Cassidy Reeves 57 y.o.  Body mass index is 36.08 kg/(m^2).  Patient Active Problem List   Diagnosis Date Noted  . Roux Y Gastric Bypass May 2006 12/29/2012    Allergies  Allergen Reactions  . Dilaudid [Hydromorphone Hcl]     Crazy hallucinations per the pt  . Penicillins     Breaks out and itches per the pt    Past Surgical History  Procedure Laterality Date  . Abdominal hysterectomy    . Exploratory laparotomy    . Cholecystectomy    . Appendectomy    . Lithotripsy    . Back surgery    . Gastric bypass     Gaspar Garbe, MD No diagnosis found.  [name] returns today and she is 8 years post Roux-en-Y gastric bypass and is keeping about 70 pounds off. She has a BMI today of 36. Her main complaint continues to be belching. I reviewed the studies that Loma Boston his performed including her EGD and upper GI series. It does not appear to be related to H. Pylori or any other issues. She does take Celebrex and Embrel for  psoriatic arthritis. I don't know if they could be causing increased release of gas in the small intestine which could preferentially go back up into the pouch where this may be related to some other foods that she is added to her diet better contributing to this. Since her nadir she has regained some weight. Her nadir appeared to be around 173 pounds. I think I would like for Huntley Dec Himmelrich  to see if we could her back on the gastric bypass diet. I'll see her back in 2 months in followup. Matt B. Daphine Deutscher, MD, [facility] [phone] beeper [phone]  02/18/2013 9:42 AM

## 2013-02-23 ENCOUNTER — Other Ambulatory Visit (INDEPENDENT_AMBULATORY_CARE_PROVIDER_SITE_OTHER): Payer: Self-pay

## 2013-03-04 ENCOUNTER — Ambulatory Visit: Payer: Self-pay

## 2013-03-04 ENCOUNTER — Other Ambulatory Visit: Payer: Self-pay | Admitting: Occupational Medicine

## 2013-03-04 DIAGNOSIS — Z0000 Encounter for general adult medical examination without abnormal findings: Secondary | ICD-10-CM

## 2013-04-29 ENCOUNTER — Ambulatory Visit (INDEPENDENT_AMBULATORY_CARE_PROVIDER_SITE_OTHER): Payer: Commercial Indemnity | Admitting: Surgery

## 2015-04-19 ENCOUNTER — Encounter: Payer: Self-pay | Admitting: Internal Medicine

## 2015-04-21 ENCOUNTER — Other Ambulatory Visit (HOSPITAL_COMMUNITY): Payer: Self-pay | Admitting: Rheumatology

## 2015-04-21 DIAGNOSIS — M25562 Pain in left knee: Secondary | ICD-10-CM

## 2015-05-04 ENCOUNTER — Ambulatory Visit (HOSPITAL_COMMUNITY): Admission: RE | Admit: 2015-05-04 | Payer: Managed Care, Other (non HMO) | Source: Ambulatory Visit

## 2015-11-27 ENCOUNTER — Ambulatory Visit: Payer: Self-pay

## 2015-11-27 ENCOUNTER — Other Ambulatory Visit: Payer: Self-pay | Admitting: Occupational Medicine

## 2015-11-27 DIAGNOSIS — Z0000 Encounter for general adult medical examination without abnormal findings: Secondary | ICD-10-CM

## 2016-05-15 ENCOUNTER — Other Ambulatory Visit: Payer: Self-pay | Admitting: Radiology

## 2016-05-15 DIAGNOSIS — Z79899 Other long term (current) drug therapy: Secondary | ICD-10-CM

## 2016-05-15 LAB — COMPLETE METABOLIC PANEL WITH GFR
ALT: 16 U/L (ref 6–29)
AST: 16 U/L (ref 10–35)
Albumin: 4.1 g/dL (ref 3.6–5.1)
Alkaline Phosphatase: 97 U/L (ref 33–130)
BILIRUBIN TOTAL: 0.2 mg/dL (ref 0.2–1.2)
BUN: 13 mg/dL (ref 7–25)
CHLORIDE: 104 mmol/L (ref 98–110)
CO2: 28 mmol/L (ref 20–31)
Calcium: 9.5 mg/dL (ref 8.6–10.4)
Creat: 0.72 mg/dL (ref 0.50–0.99)
Glucose, Bld: 93 mg/dL (ref 65–99)
POTASSIUM: 5 mmol/L (ref 3.5–5.3)
Sodium: 140 mmol/L (ref 135–146)
Total Protein: 7 g/dL (ref 6.1–8.1)

## 2016-05-15 LAB — CBC WITH DIFFERENTIAL/PLATELET
BASOS PCT: 1 %
Basophils Absolute: 65 cells/uL (ref 0–200)
EOS ABS: 130 {cells}/uL (ref 15–500)
Eosinophils Relative: 2 %
HEMATOCRIT: 37.8 % (ref 35.0–45.0)
HEMOGLOBIN: 12.1 g/dL (ref 11.7–15.5)
LYMPHS ABS: 3120 {cells}/uL (ref 850–3900)
Lymphocytes Relative: 48 %
MCH: 26.1 pg — ABNORMAL LOW (ref 27.0–33.0)
MCHC: 32 g/dL (ref 32.0–36.0)
MCV: 81.5 fL (ref 80.0–100.0)
MONO ABS: 780 {cells}/uL (ref 200–950)
MPV: 10.3 fL (ref 7.5–12.5)
Monocytes Relative: 12 %
Neutro Abs: 2405 cells/uL (ref 1500–7800)
Neutrophils Relative %: 37 %
Platelets: 313 10*3/uL (ref 140–400)
RBC: 4.64 MIL/uL (ref 3.80–5.10)
RDW: 14.7 % (ref 11.0–15.0)
WBC: 6.5 10*3/uL (ref 3.8–10.8)

## 2016-05-19 NOTE — Progress Notes (Signed)
normal

## 2016-06-13 DIAGNOSIS — E559 Vitamin D deficiency, unspecified: Secondary | ICD-10-CM | POA: Insufficient documentation

## 2016-06-13 DIAGNOSIS — Z79899 Other long term (current) drug therapy: Secondary | ICD-10-CM | POA: Insufficient documentation

## 2016-06-13 DIAGNOSIS — M170 Bilateral primary osteoarthritis of knee: Secondary | ICD-10-CM | POA: Insufficient documentation

## 2016-06-13 DIAGNOSIS — Z9884 Bariatric surgery status: Secondary | ICD-10-CM | POA: Insufficient documentation

## 2016-06-13 NOTE — Progress Notes (Signed)
Office Visit Note  Patient: Cassidy Reeves             Date of Birth: [birth year]           MRN: [id number]             PCP: Haywood Pao, MD Referring: Haywood Pao, MD Visit Date: 06/14/2016 Occupation: Arts administrator for [employer]    Subjective:  Hand stiffness   History of Present Illness: Warda Yacko is a 61 y.o. female with history of psoriasis and psoriatic arthritis. She states she had a flare last winter. She's been having increased pain and discomfort in her hands and feet and also her left knee joint. She denies any joint swelling. She is having a spot on her left lower extremity which gets numb and tingly at times. He has experienced more muscle cramps in her hands and feet. The psoriasis is only limited to elbows. She's been tolerating her medications well.  Activities of Daily Living:  Patient reports morning stiffness for 1 hour.   Patient Reports nocturnal pain.  Difficulty dressing/grooming: Denies Difficulty climbing stairs: Reports Difficulty getting out of chair: Reports Difficulty using hands for taps, buttons, cutlery, and/or writing: Denies   Review of Systems  Constitutional: Positive for fatigue. Negative for night sweats, weight gain, weight loss and weakness.  HENT: Negative for mouth sores, trouble swallowing, trouble swallowing, mouth dryness and nose dryness.   Eyes: Negative for pain, redness, visual disturbance and dryness.  Respiratory: Negative for cough, shortness of breath and difficulty breathing.   Cardiovascular: Negative for chest pain, palpitations, hypertension, irregular heartbeat and swelling in legs/feet.  Gastrointestinal: Negative for blood in stool, constipation and diarrhea.  Endocrine: Negative for increased urination.  Genitourinary: Negative for vaginal dryness.  Musculoskeletal: Positive for arthralgias, joint pain, joint swelling, myalgias, morning stiffness and myalgias. Negative for muscle weakness and muscle  tenderness.  Skin: Positive for rash. Negative for color change, hair loss, skin tightness, ulcers and sensitivity to sunlight.  Allergic/Immunologic: Negative for susceptible to infections.  Neurological: Negative for dizziness, memory loss and night sweats.  Hematological: Negative for swollen glands.  Psychiatric/Behavioral: Positive for sleep disturbance. Negative for depressed mood. The patient is not nervous/anxious.     PMFS History:  Patient Active Problem List   Diagnosis Date Noted  . High risk medication use 06/13/2016  . Primary osteoarthritis of both knees 06/13/2016  . Osteoarthritis of lumbar spine 06/13/2016  . History of gastric bypass 06/13/2016  . Vitamin D deficiency 06/13/2016  . Psoriasis 02/18/2013  . 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 Anxiety   . Arthritis   . GERD (gastroesophageal reflux disease)   . History of kidney stones   . Pelvic mass     Family History  Problem Relation Age of Onset  . Heart disease Father   . Arthritis Father   . Psoriasis Father   . Parkinsonism Father   . Colon cancer Paternal Grandfather   . Esophageal cancer Neg Hx   . Rectal cancer Neg Hx   . Stomach cancer Neg Hx    Past Surgical History:  Procedure Laterality Date  . ABDOMINAL HYSTERECTOMY    . APPENDECTOMY    . BACK SURGERY    . CHOLECYSTECTOMY    . EXPLORATORY LAPAROTOMY    . GASTRIC BYPASS    . LITHOTRIPSY     Social History   Social History Narrative  Daily caffeine      Objective: Vital Signs: BP 122/78   Pulse 78   Resp 16   Ht 5\' 6"  (1.676 m)   Wt 239 lb (108.4 kg)   BMI 38.58 kg/m    Physical Exam  Constitutional: She is oriented to person, place, and time. She appears well-developed and well-nourished.  HENT:  Head: Normocephalic and atraumatic.  Eyes: Conjunctivae and EOM are normal.  Neck: Normal range of motion.  Cardiovascular: Normal rate, regular rhythm, normal heart sounds  and intact distal pulses.   Pulmonary/Chest: Effort normal and breath sounds normal.  Abdominal: Soft. Bowel sounds are normal.  Lymphadenopathy:    She has no cervical adenopathy.  Neurological: She is alert and oriented to person, place, and time.  Skin: Skin is warm and dry. Capillary refill takes less than 2 seconds. Rash noted.  Psoriasis patches on elbows  Psychiatric: She has a normal mood and affect. Her behavior is normal.  Nursing note and vitals reviewed.    Musculoskeletal Exam: C-spine, thoracic, lumbar spine good range of motion no SI joint tenderness. Shoulder joints, elbow joints, wrist joints, MCPs PIPs DIPs with good range of motion. She has some thickening of PIP/DIP joints with no synovitis. Hip joints knee joints ankles MTPs PIPs with good range of motion with no synovitis. She discomfort range of motion of her left knee joint without any warmth swelling or effusion. Ankle joints MTPs PIPs DIPs with good range of motion with no synovitis.  CDAI Exam: No CDAI exam completed.    Investigation: Findings:  October 2014 HIV-negative, immunoglobulins normal, June 2017 CBC normal, CMP normal, TB: Negative 05/15/2016 CBC normal, CMP normal    Imaging: Xr Hand 2 View Left  Result Date: 06/14/2016 PIP, DIP narrowing some CMC joint narrowing. No MCP joint narrowing or intercarpal joint space narrowing. No erosive changes.  Xr Hand 2 View Right  Result Date: 06/14/2016 PIP/DIP narrowing. No MCP or intercarpal joint space narrowing no erosive changes.  Xr Knee 3 View Left  Result Date: 06/14/2016 Moderate medial compartment narrowing with intercondylar osteophytes moderate patellofemoral narrowing. No chondrocalcinosis. Impression: Moderate osteoarthritis and chondromalacia patella   Speciality Comments: No specialty comments available.    Procedures:  No procedures performed Allergies: Dilaudid [hydromorphone hcl] and Penicillins   Assessment / Plan:     Visit  Diagnoses: Psoriatic arthropathy ([place]): She has history of arthralgias and synovitis in the past she had no arthritis on examination today Enbrel is controlling her symptoms quite well.  Psoriasis: She has only couple of lesions of psoriasis on her elbows. She's been followed by Dr. Ubaldo Glassing.  High risk medication use - Enbrel every week by Dr. Ubaldo Glassing, clobetasol cream. Her last labs in December were normal we will check her labs again in 3 months and every 3 months to monitor for drug toxicity for TB Gold will be due in June.  Trigger index finger of left hand: She is a still having some discomfort although she responded quite well to the injection. I've advised her to use Voltaren gel over that area.  Pain and swelling of left knee - Plan: XR KNEE 3 VIEW LEFT  Primary osteoarthritis of both knees  Pain in both hands - Plan: XR Hand 2 View Right, XR Hand 2 View Left  Patient is complaining of muscle spasms and cramps. I've advised her to try some magnesium.  DDD lumbar spine  History of gastric bypass  Vitamin D deficiency    Orders: Orders Placed  This Encounter  Procedures  . XR KNEE 3 VIEW LEFT  . XR Hand 2 View Right  . XR Hand 2 View Left  . CBC with Differential/Platelet  . COMPLETE METABOLIC PANEL WITH GFR  . Quantiferon tb gold assay (blood)   Meds ordered this encounter  Medications  . diclofenac sodium (VOLTAREN) 1 % GEL    Sig: 3 grams tid prn    Dispense:  3 Tube    Refill:  3    Face-to-face time spent with patient was 35 minutes. 50% of time was spent in counseling and coordination of care.  Follow-Up Instructions: Return in about 6 months (around 12/12/2016) for Psoriatic arthritis.   Bo Merino, MD  Note - This record has been created using [employer].  Chart creation errors have been sought, but may not always  have been located. Such creation errors do not reflect on  the standard of medical care.

## 2016-06-14 ENCOUNTER — Ambulatory Visit (INDEPENDENT_AMBULATORY_CARE_PROVIDER_SITE_OTHER): Payer: Managed Care, Other (non HMO)

## 2016-06-14 ENCOUNTER — Encounter: Payer: Self-pay | Admitting: Rheumatology

## 2016-06-14 ENCOUNTER — Ambulatory Visit (INDEPENDENT_AMBULATORY_CARE_PROVIDER_SITE_OTHER): Payer: Managed Care, Other (non HMO) | Admitting: Rheumatology

## 2016-06-14 VITALS — BP 122/78 | HR 78 | Resp 16 | Ht 66.0 in | Wt 239.0 lb

## 2016-06-14 DIAGNOSIS — M25562 Pain in left knee: Secondary | ICD-10-CM | POA: Diagnosis not present

## 2016-06-14 DIAGNOSIS — M79642 Pain in left hand: Secondary | ICD-10-CM

## 2016-06-14 DIAGNOSIS — Z9884 Bariatric surgery status: Secondary | ICD-10-CM

## 2016-06-14 DIAGNOSIS — Z79899 Other long term (current) drug therapy: Secondary | ICD-10-CM | POA: Diagnosis not present

## 2016-06-14 DIAGNOSIS — M47816 Spondylosis without myelopathy or radiculopathy, lumbar region: Secondary | ICD-10-CM | POA: Diagnosis not present

## 2016-06-14 DIAGNOSIS — M65322 Trigger finger, left index finger: Secondary | ICD-10-CM

## 2016-06-14 DIAGNOSIS — M79641 Pain in right hand: Secondary | ICD-10-CM

## 2016-06-14 DIAGNOSIS — L409 Psoriasis, unspecified: Secondary | ICD-10-CM | POA: Diagnosis not present

## 2016-06-14 DIAGNOSIS — Z9889 Other specified postprocedural states: Secondary | ICD-10-CM | POA: Diagnosis not present

## 2016-06-14 DIAGNOSIS — L4050 Arthropathic psoriasis, unspecified: Secondary | ICD-10-CM

## 2016-06-14 DIAGNOSIS — M25462 Effusion, left knee: Secondary | ICD-10-CM | POA: Diagnosis not present

## 2016-06-14 DIAGNOSIS — E559 Vitamin D deficiency, unspecified: Secondary | ICD-10-CM

## 2016-06-14 DIAGNOSIS — M170 Bilateral primary osteoarthritis of knee: Secondary | ICD-10-CM | POA: Diagnosis not present

## 2016-06-14 MED ORDER — DICLOFENAC SODIUM 1 % TD GEL: TRANSDERMAL | 3 refills | Status: DC

## 2016-06-14 NOTE — Patient Instructions (Addendum)
Supplements for OA Natural anti-inflammatories  You can purchase these at [employer], [employer] or online.  . Turmeric (capsules)  . Ginger (ginger root or capsules)  . Omega 3 (Fish, flax seeds, chia seeds, walnuts, almonds)  . Tart cherry (dried or extract)   Patient should be under the care of a physician while taking these supplements. This may not be reproduced without the permission of Dr. Bo Merino.   Magnesium malate 250 mg by mouth daily for muscle cramps  Standing Labs We placed an order today for your standing lab work.    Please come back and get your standing labs in March and every 3 months  We have open lab Monday through Friday from 8:30-11:30 AM and 1:30-4 PM at the office of Dr. Tresa Moore, PA.   The office is located at [address] [place], [place], [place] [address] No appointment is necessary.   Labs are drawn by [facility].  You may receive a bill from [facility] for your lab work.    TB gold test is due in June

## 2016-06-26 ENCOUNTER — Other Ambulatory Visit: Payer: Self-pay | Admitting: Rheumatology

## 2016-06-26 NOTE — Telephone Encounter (Signed)
Last Visit: 06/14/16 Next Visit: 12/12/16 Labs: 05/15/16  Okay to refill Robaxin?

## 2016-07-10 DIAGNOSIS — L4050 Arthropathic psoriasis, unspecified: Secondary | ICD-10-CM | POA: Insufficient documentation

## 2016-07-10 DIAGNOSIS — M6530 Trigger finger, unspecified finger: Secondary | ICD-10-CM | POA: Insufficient documentation

## 2016-07-10 NOTE — Progress Notes (Cosign Needed)
Office Visit Note  Patient: Cassidy Reeves             Date of Birth: 05-30-1955           MRN: [id number]             PCP: Haywood Pao, MD Referring: Haywood Pao, MD Visit Date: 07/24/2016 Occupation: '@GUAROCC'$ @    Subjective:  Pain of the Left Knee (Wants injection left knee) and Follow-up   History of Present Illness: Cassidy Reeves is a 61 y.o. female  Patient seen January 2018 by Dr. Estanislado Pandy and is returning today just for left knee injection. Nothing has changed since the last visit as it relates to her psoriatic arthritis and psoriasis.    Activities of Daily Living:  Patient reports morning stiffness for 60 minutes.   Patient Reports nocturnal pain.  Difficulty dressing/grooming: Denies Difficulty climbing stairs: Reports Difficulty getting out of chair: Reports Difficulty using hands for taps, buttons, cutlery, and/or writing: Denies   No Rheumatology ROS completed.   PMFS History:  Patient Active Problem List   Diagnosis Date Noted  . Primary osteoarthritis of both hands 07/22/2016  . DDD lumbar spine 07/22/2016  . Trigger finger 07/10/2016  . Psoriatic arthropathy ([place]) 07/10/2016  . High risk medication use 06/13/2016  . Primary osteoarthritis of both knees 06/13/2016  . Osteoarthritis of lumbar spine 06/13/2016  . History of gastric bypass 06/13/2016  . Vitamin D deficiency 06/13/2016  . Psoriasis 02/18/2013  . 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 Anxiety   . Arthritis   . GERD (gastroesophageal reflux disease)   . History of kidney stones   . Pelvic mass     Family History  Problem Relation Age of Onset  . Heart disease Father   . Arthritis Father   . Psoriasis Father   . Parkinsonism Father   . Colon cancer Paternal Grandfather   . Esophageal cancer Neg Hx   . Rectal cancer Neg Hx   . Stomach cancer Neg Hx    Past Surgical History:  Procedure Laterality Date  . ABDOMINAL  HYSTERECTOMY    . APPENDECTOMY    . BACK SURGERY    . CHOLECYSTECTOMY    . EXPLORATORY LAPAROTOMY    . GASTRIC BYPASS    . LITHOTRIPSY     Social History   Social History Narrative   Daily caffeine      Objective: Vital Signs: BP 126/76   Pulse 80   Resp 16   Ht '5\' 6"'$  (1.676 m)   Wt 240 lb (108.9 kg)   BMI 38.74 kg/m    Physical Exam   Musculoskeletal Exam:    CDAI Exam: No CDAI exam completed.    Investigation: Findings:  February 2017:  CBC and comprehensive metabolic panel were normal.   12/21/2014 Ms. Solt is a 61 year old female with history of psoriatic arthritis.  She continues to have some pain and stiffness in her hands and has been on Enbrel for a long time.  We wanted to do a ultrasound examination to see if she has underlying synovitis or tenosynovitis.    PROCEDURE:  After informed consent was obtained per EULAR recommendation, ultrasound examination of the bilateral hands was performed.  Using 12 MHz transducer, grayscale and power Doppler, bilateral 2nd, 3rd and 5th MCP joint and PIP, DIP joints of bilateral 2nd, 3rd 4th, 5th digits were evaluated.  Both wrist joints dorsally volar  aspect were evaluated to look for synovitis or tenosynovitis.  She has significant joint space narrowing and all PIP, DIP narrowing and some ankylosis of DIP, PIP joints.  Her right 3rd MCP showed mild hyperemia and left 2nd MCP showed mild hyperemia.  Wrist joints were unremarkable.  Her bilateral median nerve with bifid right being 0.10 square and left being also 0.10 cm square which were upper limits of normal.    IMPRESSION AND PLAN:  We had detailed discussion regarding her ultrasound findings.  She does show chronic disease activity with joint space narrowing, synovial thickening and ankylosis of DIP joints, but no significant active synovitis was noted.    Labs from January 2016 show CBC with diff is normal.  CMP with GFR is normal.  X-rays of bilateral hands, 2 views, show  normal x-rays.  X-rays of bilateral feet, 2 views, are also normal.  Orders Only on 05/15/2016  Component Date Value Ref Range Status  . WBC 05/15/2016 6.5  3.8 - 10.8 K/uL Final  . RBC 05/15/2016 4.64  3.80 - 5.10 MIL/uL Final  . Hemoglobin 05/15/2016 12.1  11.7 - 15.5 g/dL Final  . HCT 05/15/2016 37.8  35.0 - 45.0 % Final  . MCV 05/15/2016 81.5  80.0 - 100.0 fL Final  . MCH 05/15/2016 26.1* 27.0 - 33.0 pg Final  . MCHC 05/15/2016 32.0  32.0 - 36.0 g/dL Final  . RDW 05/15/2016 14.7  11.0 - 15.0 % Final  . Platelets 05/15/2016 313  140 - 400 K/uL Final  . MPV 05/15/2016 10.3  7.5 - 12.5 fL Final  . Neutro Abs 05/15/2016 2405  1,500 - 7,800 cells/uL Final  . Lymphs Abs 05/15/2016 3120  850 - 3,900 cells/uL Final  . Monocytes Absolute 05/15/2016 780  200 - 950 cells/uL Final  . Eosinophils Absolute 05/15/2016 130  15 - 500 cells/uL Final  . Basophils Absolute 05/15/2016 65  0 - 200 cells/uL Final  . Neutrophils Relative % 05/15/2016 37  % Final  . Lymphocytes Relative 05/15/2016 48  % Final  . Monocytes Relative 05/15/2016 12  % Final  . Eosinophils Relative 05/15/2016 2  % Final  . Basophils Relative 05/15/2016 1  % Final  . Smear Review 05/15/2016 Criteria for review not met   Final  . Sodium 05/15/2016 140  135 - 146 mmol/L Final  . Potassium 05/15/2016 5.0  3.5 - 5.3 mmol/L Final  . Chloride 05/15/2016 104  98 - 110 mmol/L Final  . CO2 05/15/2016 28  20 - 31 mmol/L Final  . Glucose, Bld 05/15/2016 93  65 - 99 mg/dL Final  . BUN 05/15/2016 13  7 - 25 mg/dL Final  . Creat 05/15/2016 0.72  0.50 - 0.99 mg/dL Final   Comment:   For patients > or = 62 years of age: The upper reference limit for Creatinine is approximately 13% higher for people identified as African-American.     . Total Bilirubin 05/15/2016 0.2  0.2 - 1.2 mg/dL Final  . Alkaline Phosphatase 05/15/2016 97  33 - 130 U/L Final  . AST 05/15/2016 16  10 - 35 U/L Final  . ALT 05/15/2016 16  6 - 29 U/L Final  . Total  Protein 05/15/2016 7.0  6.1 - 8.1 g/dL Final  . Albumin 05/15/2016 4.1  3.6 - 5.1 g/dL Final  . Calcium 05/15/2016 9.5  8.6 - 10.4 mg/dL Final  . GFR, Est African American 05/15/2016 >89  >=60 mL/min Final  . GFR, Est Non  African American 05/15/2016 >89  >=60 mL/min Final      Imaging: No results found.  Speciality Comments: No specialty comments available.    Procedures:  Large Joint Inj Date/Time: 07/24/2016 12:17 PM Performed by: Eliezer Lofts Authorized by: Eliezer Lofts   Consent Given by:  Patient Site marked: the procedure site was marked   Timeout: prior to procedure the correct patient, procedure, and site was verified   Indications:  Pain and joint swelling Location:  Knee Site:  L knee Prep: patient was prepped and draped in usual sterile fashion   Needle Size:  27 G Needle Length:  1.5 inches Approach:  Medial Ultrasound Guidance: No   Fluoroscopic Guidance: No   Arthrogram: No   Medications:  1.5 mL lidocaine 1 %; 40 mg triamcinolone acetonide 40 MG/ML Aspiration Attempted: Yes   Patient tolerance:  Patient tolerated the procedure well with no immediate complications  #1: Failed weight loss #2: X-ray proven left knee osteoarthritis #3: Failed NSAIDS #4: Last cortisone injection in the left knee was May 2017. Patient started having pain once again. No falls, injury. She rates her pain as 5 on a scale of 0-10. After the cortisone injection, she rates the pain as 0 on a scale of 0-10.  #5: We will apply for Visco supplementation bilateral knees. Hyalgan is preferred Euflex and will be acceptable or Orthovisc.   Left knee: No warmth, effusion to the left knee. Rates her pain as 5 on a scale of 0-10. And at times he can go as high as a 7.  Allergies: Dilaudid [hydromorphone hcl] and Penicillins   Assessment / Plan:     Visit Diagnoses: Primary osteoarthritis of both knees - Bilateral moderate  Chronic pain of left knee  Psoriatic arthropathy  (HCC)  Psoriasis  High risk medication use - Enbrel weekly by Dr. Ubaldo Glassing, clobetasol cream  Trigger index finger of left hand  Primary osteoarthritis of both hands - Bilateral mild  DDD lumbar spine  Vitamin D deficiency  History of Roux-en-Y gastric bypass   Plan: #1: Left knee pain for the last few weeks. She rates her pain as 5 on a scale of 0-10 and on occasion it is about a 7 on a scale of 0-10. Her last injection of cortisone to the left knee was approximately May 2017. Patient is requesting a cortisone injection in the left knee now.  #2: We will apply for Visco supplementation for bilateral knee per patient's request. Hyalgan is preferred but Euflex or Orthovisc will be acceptable. Although the left knee was the problem last visit in May and today's visit, I believe the patient would benefit from preserving the right knee and getting injections of Visco in both knees if possible. Patient is agreeable  #3: Patient recently saw Dr. Estanislado Pandy for psoriatic arthropathy and psoriasis. She is doing well with that. She has an appointment for follow-up for this several months from now which she will keep a schedule.  #4: We will make separate appointment for Visco supplementation to each knee will inform the patient when  Orders: No orders of the defined types were placed in this encounter.  No orders of the defined types were placed in this encounter.   Face-to-face time spent with patient was 30 minutes. 50% of time was spent in counseling and coordination of care.  Follow-Up Instructions: No Follow-up on file.   Eliezer Lofts, PA-C  Note - This record has been created using [employer].  Chart creation errors have  been sought, but may not always  have been located. Such creation errors do not reflect on  the standard of medical care.

## 2016-07-22 DIAGNOSIS — M19041 Primary osteoarthritis, right hand: Secondary | ICD-10-CM | POA: Insufficient documentation

## 2016-07-22 DIAGNOSIS — M47816 Spondylosis without myelopathy or radiculopathy, lumbar region: Secondary | ICD-10-CM | POA: Insufficient documentation

## 2016-07-22 DIAGNOSIS — M19042 Primary osteoarthritis, left hand: Secondary | ICD-10-CM

## 2016-07-24 ENCOUNTER — Ambulatory Visit (INDEPENDENT_AMBULATORY_CARE_PROVIDER_SITE_OTHER): Payer: Managed Care, Other (non HMO) | Admitting: Rheumatology

## 2016-07-24 ENCOUNTER — Encounter: Payer: Self-pay | Admitting: Rheumatology

## 2016-07-24 VITALS — BP 126/76 | HR 80 | Resp 16 | Ht 66.0 in | Wt 240.0 lb

## 2016-07-24 DIAGNOSIS — M25562 Pain in left knee: Secondary | ICD-10-CM | POA: Diagnosis not present

## 2016-07-24 DIAGNOSIS — G8929 Other chronic pain: Secondary | ICD-10-CM | POA: Diagnosis not present

## 2016-07-24 DIAGNOSIS — M170 Bilateral primary osteoarthritis of knee: Secondary | ICD-10-CM | POA: Diagnosis not present

## 2016-07-24 DIAGNOSIS — Z79899 Other long term (current) drug therapy: Secondary | ICD-10-CM | POA: Diagnosis not present

## 2016-07-24 DIAGNOSIS — L4050 Arthropathic psoriasis, unspecified: Secondary | ICD-10-CM

## 2016-07-24 DIAGNOSIS — L409 Psoriasis, unspecified: Secondary | ICD-10-CM | POA: Diagnosis not present

## 2016-07-24 MED ORDER — TRIAMCINOLONE ACETONIDE 40 MG/ML IJ SUSP
40.0000 mg | INTRAMUSCULAR | Status: AC | PRN
  Administered 2016-07-24: 40 mg via INTRA_ARTICULAR

## 2016-07-24 MED ORDER — LIDOCAINE HCL 1 % IJ SOLN
1.5000 mL | INTRAMUSCULAR | Status: AC | PRN
  Administered 2016-07-24: 1.5 mL

## 2016-07-26 ENCOUNTER — Telehealth: Payer: Self-pay | Admitting: *Deleted

## 2016-07-26 ENCOUNTER — Encounter: Payer: Self-pay | Admitting: Cardiovascular Disease

## 2016-07-26 ENCOUNTER — Ambulatory Visit (INDEPENDENT_AMBULATORY_CARE_PROVIDER_SITE_OTHER): Payer: Managed Care, Other (non HMO) | Admitting: Cardiovascular Disease

## 2016-07-26 ENCOUNTER — Other Ambulatory Visit: Payer: Self-pay | Admitting: Cardiology

## 2016-07-26 VITALS — BP 114/66 | HR 73 | Ht 66.0 in | Wt 238.8 lb

## 2016-07-26 DIAGNOSIS — R002 Palpitations: Secondary | ICD-10-CM

## 2016-07-26 DIAGNOSIS — E6609 Other obesity due to excess calories: Secondary | ICD-10-CM | POA: Diagnosis not present

## 2016-07-26 DIAGNOSIS — R079 Chest pain, unspecified: Secondary | ICD-10-CM

## 2016-07-26 LAB — TSH: TSH: 2.06 m[IU]/L

## 2016-07-26 LAB — T4, FREE: Free T4: 1 ng/dL (ref 0.8–1.8)

## 2016-07-26 NOTE — Patient Instructions (Addendum)
Medication Instructions:  Your physician recommends that you continue on your current medications as directed. Please refer to the Current Medication list given to you today.  Labwork: Please have lab work completed today (TSH, T4 free)  Testing/Procedures: Your physician has requested that you have a lexiscan myoview. For further information please visit [email or web address]. Please follow instruction sheet, as given.  Your physician has recommended that you wear a 21 DAY event monitor. Event monitors are medical devices that record the heart's electrical activity. Doctors most often [place] these monitors to diagnose arrhythmias. Arrhythmias are problems with the speed or rhythm of the heartbeat. The monitor is a small, portable device. You can wear one while you do your normal daily activities. This is usually used to diagnose what is causing palpitations/syncope (passing out).  Both of these will be done at [facility]: [address] [place] [address]  Follow-Up: Your physician recommends that you schedule a follow-up appointment in: 6 weeks with Dr. Oval Linsey.   Any Other Special Instructions Will Be Listed Below (If Applicable).     If you need a refill on your cardiac medications before your next appointment, please call your pharmacy.

## 2016-07-26 NOTE — Progress Notes (Signed)
Cardiology Office Note   Date:  07/26/2016   ID:  Cassidy Reeves, DOB 11-Jan-1956, MRN [id number]  PCP:  Haywood Pao, MD  Cardiologist:   Skeet Latch, MD   No chief complaint on file.     History of Present Illness: Cassidy Reeves is a 61 y.o. female with obesity s/p Roux en Y in 2006, anxietyy, depression and GERD  who presents for an evaluation of dyspnea on exertion.  Cassidy Reeves saw Dr. Osborne Casco on 07/23/16 and reported exertional dyspnea.  This was felt to be multifactorial, but she was referred to cardiology for an ischemia evaluation.  She reports that for the last 6 months she has noted increased shortness of breath with activity. She also has episodes of chest pounding and chest tightness that radiates into her left shoulder. The symptoms occur approximately once every two weeks and last for up to 30 minutes at a time. The chest tightness and palpitations occur both at rest and with exertion. It is also worse in times of stress. She does not drink much caffeine, and typically only has one coffee daily. She notes mild lower extremity edema with travel but denies orthopnea or PND.  She is all is troubled with her weight and knows that that does contribute to her shortness of breath, but something seems different now. She is not getting any exercise but is physically active throughout the day.  Cassidy Reeves  works at the [employer]. At times when she has been feeling she has her blood pressure checked and noted that his been as high as 170/90. However, it is typically well-controlled and has not had any elevated readings with her multiple doctor visits.  It was 128/74 when she saw Dr. Osborne Casco earlier this week.   Past Medical History:  Diagnosis Date  . Anxiety   . Arthritis   . GERD (gastroesophageal reflux disease)   . History of kidney stones   . Pelvic mass     Past Surgical History:  Procedure Laterality Date  . ABDOMINAL HYSTERECTOMY    . APPENDECTOMY    . BACK SURGERY    .  CHOLECYSTECTOMY    . EXPLORATORY LAPAROTOMY    . GASTRIC BYPASS    . LITHOTRIPSY       Current Outpatient Prescriptions  Medication Sig Dispense Refill  . ALPRAZolam (XANAX) 0.5 MG tablet Uses as directed    . BREO ELLIPTA 200-25 MCG/INH AEPB as needed.     . clobetasol cream (TEMOVATE) 0.05 %     . diclofenac sodium (VOLTAREN) 1 % GEL 3 grams tid prn 3 Tube 3  . DULoxetine (CYMBALTA) 60 MG capsule Take 60 mg by mouth daily.     . ENBREL 50 MG/ML injection As directed    . estradiol (VIVELLE-DOT) 0.05 MG/24HR patch     . methocarbamol (ROBAXIN) 500 MG tablet TAKE 1 TABLET BY MOUTH 3 TIMES A DAY AS NEEDED 90 tablet 2  . zolpidem (AMBIEN) 10 MG tablet At bedtime as needed     No current facility-administered medications for this visit.     Allergies:   Dilaudid [hydromorphone hcl] and Penicillins    Social History:  The patient  reports that she has never smoked. She has never used smokeless tobacco. She reports that she drinks alcohol. She reports that she does not use drugs.   Family History:  [name] family history includes Arthritis in her father; Colon cancer in her paternal grandfather; Heart disease in her father;  Heart failure in her paternal grandmother; Other (age of onset: 46) in her father; Parkinsonism in her father; Psoriasis in her father; Stroke in her maternal grandmother.    ROS:  Please see the history of present illness.   Otherwise, review of systems are positive for none.   All other systems are reviewed and negative.    PHYSICAL EXAM: VS:  BP 114/66   Ht 5\' 6"  (1.676 m)   Wt 108.3 kg (238 lb 12.8 oz)   BMI 38.54 kg/m  , BMI Body mass index is 38.54 kg/m. GENERAL:  Well appearing HEENT:  Pupils equal round and reactive, fundi not visualized, oral mucosa unremarkable NECK:  No jugular venous distention, waveform within normal limits, carotid upstroke brisk and symmetric, no bruits, no thyromegaly LYMPHATICS:  No cervical adenopathy LUNGS:  Clear to  auscultation bilaterally HEART:  RRR.  PMI not displaced or sustained,S1 and S2 within normal limits, no S3, no S4, no clicks, no rubs, no murmurs ABD:  Flat, positive bowel sounds normal in frequency in pitch, no bruits, no rebound, no guarding, no midline pulsatile mass, no hepatomegaly, no splenomegaly EXT:  2 plus pulses throughout, no edema, no cyanosis no clubbing SKIN:  No rashes no nodules NEURO:  Cranial nerves II through XII grossly intact, motor grossly intact throughout PSYCH:  Cognitively intact, oriented to person place and time   EKG:  EKG is ordered today. The ekg ordered today demonstrates sinus rhythm.  Rate 73 bpm.     Recent Labs: 05/15/2016: ALT 16; BUN 13; Creat 0.72; Hemoglobin 12.1; Platelets 313; Potassium 5.0; Sodium 140    Lipid Panel No results found for: CHOL, TRIG, HDL, CHOLHDL, VLDL, LDLCALC, LDLDIRECT    Wt Readings from Last 3 Encounters:  07/26/16 108.3 kg (238 lb 12.8 oz)  07/24/16 108.9 kg (240 lb)  06/14/16 108.4 kg (239 lb)      ASSESSMENT AND PLAN:  # Chest pain: # Shortness of breath: Both typical and atypical symtpoms.  We will get a Lexiscan Myoview to evaluate.  Obtain lipids from PCP.   No evidence of heart failure or murmurs on exam so we will defer an echo at this time.   # Elevated blood pressure:  BP today is normal.  I have asked her to monitor at home and bring this to follow up.   # Palpitations: 21 day event monitor.  CMP and CBC were within normal limits 04/2016 and she reports having labs checked more recently with her primary care physician. She has not had her thyroid checked. We will add a TSH and free T4 today.   Current medicines are reviewed at length with the patient today.  The patient does not have concerns regarding medicines.  The following changes have been made:  no change  Labs/ tests ordered today include:   Orders Placed This Encounter  Procedures  . TSH  . T4, free  . Cardiac event monitor  .  Myocardial Perfusion Imaging  . EKG 12-Lead     Disposition:   FU with Alissandra Geoffroy C. Oval Linsey, MD, [facility] in 6 weeks.    This note was written with the assistance of speech recognition software.  Please excuse any transcriptional errors.  Signed, Ieasha Boerema C. Oval Linsey, MD, [facility]  07/26/2016 9:20 AM    [place]

## 2016-07-26 NOTE — Telephone Encounter (Signed)
Per Rondel Oh for 07/30/16 @ 1:00 pm on the nuclear schedule has to be rescheduled----I left a voice mail for patient to call so I can reschedule.

## 2016-07-29 ENCOUNTER — Telehealth (HOSPITAL_COMMUNITY): Payer: Self-pay | Admitting: Cardiovascular Disease

## 2016-07-30 ENCOUNTER — Telehealth (HOSPITAL_COMMUNITY): Payer: Self-pay

## 2016-07-30 ENCOUNTER — Inpatient Hospital Stay (HOSPITAL_COMMUNITY): Admission: RE | Admit: 2016-07-30 | Payer: Self-pay | Source: Ambulatory Visit

## 2016-07-30 NOTE — Telephone Encounter (Signed)
Encounter complete. 

## 2016-07-31 ENCOUNTER — Ambulatory Visit (HOSPITAL_COMMUNITY)
Admission: RE | Admit: 2016-07-31 | Discharge: 2016-07-31 | Disposition: A | Discharge status: Home / Self Care | Payer: Managed Care, Other (non HMO) | Source: Ambulatory Visit | Attending: Cardiovascular Disease | Admitting: Cardiovascular Disease

## 2016-07-31 DIAGNOSIS — R002 Palpitations: Secondary | ICD-10-CM

## 2016-07-31 DIAGNOSIS — R079 Chest pain, unspecified: Secondary | ICD-10-CM

## 2016-07-31 LAB — MYOCARDIAL PERFUSION IMAGING
CHL CUP NUCLEAR SDS: 3
CHL CUP NUCLEAR SRS: 0
CSEPPHR: 96 {beats}/min
LV dias vol: 87 mL (ref 46–106)
LV sys vol: 25 mL
Rest HR: 67 {beats}/min
SSS: 3
TID: 1.14

## 2016-07-31 MED ORDER — TECHNETIUM TC 99M TETROFOSMIN IV KIT
9.8000 | PACK | Freq: Once | INTRAVENOUS | Status: AC | PRN
  Administered 2016-07-31: 9.8 via INTRAVENOUS
  Filled 2016-07-31: qty 10

## 2016-07-31 MED ORDER — REGADENOSON 0.4 MG/5ML IV SOLN
0.4000 mg | Freq: Once | INTRAVENOUS | Status: AC
  Administered 2016-07-31: 0.4 mg via INTRAVENOUS

## 2016-07-31 MED ORDER — TECHNETIUM TC 99M TETROFOSMIN IV KIT
30.1000 | PACK | Freq: Once | INTRAVENOUS | Status: AC | PRN
  Administered 2016-07-31: 30.1 via INTRAVENOUS
  Filled 2016-07-31: qty 31

## 2016-07-31 NOTE — Telephone Encounter (Signed)
  07/29/2016 11:09 AM Phone (Outgoing) Wenda, Vanschaick (Self) [phone] (M)   Left Message - Called pt and lmsg for he to cb to r/s appt due to no tech being in the office to perform test.     By Verdene Rio

## 2016-08-02 ENCOUNTER — Telehealth: Payer: Self-pay | Admitting: Cardiovascular Disease

## 2016-08-02 NOTE — Telephone Encounter (Signed)
Pt returned office call to get her results.   Please give her a call back.

## 2016-08-02 NOTE — Telephone Encounter (Signed)
Spoke to patient. Lab work and [employer] given . Verbalized understanding

## 2016-08-05 ENCOUNTER — Ambulatory Visit (INDEPENDENT_AMBULATORY_CARE_PROVIDER_SITE_OTHER): Payer: Managed Care, Other (non HMO)

## 2016-08-05 DIAGNOSIS — R002 Palpitations: Secondary | ICD-10-CM | POA: Diagnosis not present

## 2016-08-05 DIAGNOSIS — R079 Chest pain, unspecified: Secondary | ICD-10-CM

## 2016-08-26 ENCOUNTER — Telehealth (INDEPENDENT_AMBULATORY_CARE_PROVIDER_SITE_OTHER): Payer: Self-pay | Admitting: Rheumatology

## 2016-08-26 NOTE — Telephone Encounter (Signed)
Patient was calling to check the status of the medication for injections.  Please call her back to discuss. [phone]

## 2016-09-05 ENCOUNTER — Ambulatory Visit: Payer: Managed Care, Other (non HMO) | Admitting: Cardiovascular Disease

## 2016-09-05 NOTE — Progress Notes (Deleted)
Cardiology Office Note   Date:  09/05/2016   ID:  Cassidy Reeves, DOB [birth year], MRN [id number]  PCP:  Cassidy Pao, MD  Cardiologist:   Cassidy Latch, MD   No chief complaint on file.     History of Present Illness: Cassidy Reeves is a 61 y.o. female with obesity s/p Roux en Y in 2006, anxietyy, depression and GERD  who presents for an evaluation of dyspnea on exertion.  Cassidy Reeves saw Dr. Osborne Reeves on 07/23/16 and reported exertional dyspnea.  This was felt to be multifactorial, but she was referred to cardiology for an ischemia evaluation.  She reports that for the last 6 months she has noted increased shortness of breath with activity. She also has episodes of chest pounding and chest tightness that radiates into her left shoulder. The symptoms occur approximately once every two weeks and last for up to 30 minutes at a time. The chest tightness and palpitations occur both at rest and with exertion. It is also worse in times of stress. She does not drink much caffeine, and typically only has one coffee daily. She notes mild lower extremity edema with travel but denies orthopnea or PND.  She is all is troubled with her weight and knows that that does contribute to her shortness of breath, but something seems different now. She is not getting any exercise but is physically active throughout the day.  Cassidy Reeves  works at the [employer]. At times when she has been feeling she has her blood pressure checked and noted that his been as high as 170/90. However, it is typically well-controlled and has not had any elevated readings with her multiple doctor visits.  It was 128/74 when she saw Dr. Osborne Reeves earlier this week.   Past Medical History:  Diagnosis Date  . Anxiety   . Arthritis   . GERD (gastroesophageal reflux disease)   . History of kidney stones   . Pelvic mass     Past Surgical History:  Procedure Laterality Date  . ABDOMINAL HYSTERECTOMY    . APPENDECTOMY    . BACK SURGERY    .  CHOLECYSTECTOMY    . EXPLORATORY LAPAROTOMY    . GASTRIC BYPASS    . LITHOTRIPSY       Current Outpatient Prescriptions  Medication Sig Dispense Refill  . ALPRAZolam (XANAX) 0.5 MG tablet Uses as directed    . BREO ELLIPTA 200-25 MCG/INH AEPB as needed.     . clobetasol cream (TEMOVATE) 0.05 %     . diclofenac sodium (VOLTAREN) 1 % GEL 3 grams tid prn 3 Tube 3  . DULoxetine (CYMBALTA) 60 MG capsule Take 60 mg by mouth daily.     . ENBREL 50 MG/ML injection As directed    . estradiol (VIVELLE-DOT) 0.05 MG/24HR patch     . methocarbamol (ROBAXIN) 500 MG tablet TAKE 1 TABLET BY MOUTH 3 TIMES A DAY AS NEEDED 90 tablet 2  . zolpidem (AMBIEN) 10 MG tablet At bedtime as needed     No current facility-administered medications for this visit.     Allergies:   Dilaudid [hydromorphone hcl] and Penicillins    Social History:  The patient  reports that she has never smoked. She has never used smokeless tobacco. She reports that she drinks alcohol. She reports that she does not use drugs.   Family History:  [name] family history includes Arthritis in her father; Colon cancer in her paternal grandfather; Heart disease in her father;  Heart failure in her paternal grandmother; Other (age of onset: 19) in her father; Parkinsonism in her father; Psoriasis in her father; Stroke in her maternal grandmother.    ROS:  Please see the history of present illness.   Otherwise, review of systems are positive for none.   All other systems are reviewed and negative.    PHYSICAL EXAM: VS:  There were no vitals taken for this visit. , BMI There is no height or weight on file to calculate BMI. GENERAL:  Well appearing HEENT:  Pupils equal round and reactive, fundi not visualized, oral mucosa unremarkable NECK:  No jugular venous distention, waveform within normal limits, carotid upstroke brisk and symmetric, no bruits, no thyromegaly LYMPHATICS:  No cervical adenopathy LUNGS:  Clear to auscultation  bilaterally HEART:  RRR.  PMI not displaced or sustained,S1 and S2 within normal limits, no S3, no S4, no clicks, no rubs, no murmurs ABD:  Flat, positive bowel sounds normal in frequency in pitch, no bruits, no rebound, no guarding, no midline pulsatile mass, no hepatomegaly, no splenomegaly EXT:  2 plus pulses throughout, no edema, no cyanosis no clubbing SKIN:  No rashes no nodules NEURO:  Cranial nerves II through XII grossly intact, motor grossly intact throughout PSYCH:  Cognitively intact, oriented to person place and time   EKG:  EKG is ordered today. The ekg ordered today demonstrates sinus rhythm.  Rate 73 bpm.     Recent Labs: 05/15/2016: ALT 16; BUN 13; Creat 0.72; Hemoglobin 12.1; Platelets 313; Potassium 5.0; Sodium 140 07/26/2016: TSH 2.06    Lipid Panel No results found for: CHOL, TRIG, HDL, CHOLHDL, VLDL, LDLCALC, LDLDIRECT    Wt Readings from Last 3 Encounters:  07/31/16 108 kg (238 lb)  07/26/16 108.3 kg (238 lb 12.8 oz)  07/24/16 108.9 kg (240 lb)      ASSESSMENT AND PLAN:  # Chest pain: # Shortness of breath: Both typical and atypical symtpoms.  We will get a Lexiscan Myoview to evaluate.  Obtain lipids from PCP.   No evidence of heart failure or murmurs on exam so we will defer an echo at this time.   # Elevated blood pressure:  BP today is normal.  I have asked her to monitor at home and bring this to follow up.   # Palpitations: 21 day event monitor.  CMP and CBC were within normal limits 04/2016 and she reports having labs checked more recently with her primary care physician. She has not had her thyroid checked. We will add a TSH and free T4 today.   Current medicines are reviewed at length with the patient today.  The patient does not have concerns regarding medicines.  The following changes have been made:  no change  Labs/ tests ordered today include:   No orders of the defined types were placed in this encounter.    Disposition:   FU with  Cassidy Rigsby C. Oval Linsey, MD, [facility] in 6 weeks.    This note was written with the assistance of speech recognition software.  Please excuse any transcriptional errors.  Signed, Cassidy Strang C. Oval Linsey, MD, [facility]  09/05/2016 8:06 AM    [facility]

## 2016-09-10 ENCOUNTER — Encounter: Payer: Self-pay | Admitting: Cardiovascular Disease

## 2016-09-10 ENCOUNTER — Ambulatory Visit (INDEPENDENT_AMBULATORY_CARE_PROVIDER_SITE_OTHER): Payer: Managed Care, Other (non HMO) | Admitting: Cardiovascular Disease

## 2016-09-10 VITALS — BP 118/62 | HR 79 | Ht 66.0 in | Wt 230.0 lb

## 2016-09-10 DIAGNOSIS — R0602 Shortness of breath: Secondary | ICD-10-CM | POA: Diagnosis not present

## 2016-09-10 DIAGNOSIS — R002 Palpitations: Secondary | ICD-10-CM

## 2016-09-10 NOTE — Patient Instructions (Signed)
Medication Instructions:  ?Your physician recommends that you continue on your current medications as directed. Please refer to the Current Medication list given to you today.  ? ?Labwork: ?NONE ? ?Testing/Procedures: ?NONE ? ?Follow-Up: ?AS NEEDED  ? ?  ?
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
## 2016-09-18 ENCOUNTER — Telehealth: Payer: Self-pay | Admitting: Rheumatology

## 2016-09-18 NOTE — Telephone Encounter (Signed)
Patient called to check status of Euflexxa injections. Please advise.

## 2016-09-23 NOTE — Progress Notes (Signed)
Office Visit Note  Patient: Cassidy Reeves             Date of Birth: October 24, 1955           MRN: [id number]             PCP: Haywood Pao, MD Referring: Haywood Pao, MD Visit Date: 09/24/2016 Occupation: @GUAROCC @    Subjective:  Follow-up   History of Present Illness: Cassidy Reeves is a 61 y.o. female   Last seen March 2018 for left knee cortisone injection. She is doing well with the left knee but note the right knee is hurting and she is requesting a cortisone injection because she is having a 21 day vacation where she'll be traveling to the [place] in [place]. She will not be doing a lot of hiking but there will be some walking. We have ordered E apply for Visco supplementation for the patient and were waiting approval for that.  She does have a history of psoriasis and psoriatic arthritis. Patient states that she is doing really well with the Enbrel which she takes once a week. She is compliant with the medication and does not forget her medication.    Activities of Daily Living:  Patient reports morning stiffness for 15 minutes.   Patient Reports nocturnal pain.  Difficulty dressing/grooming: Denies Difficulty climbing stairs: Reports Difficulty getting out of chair: Reports Difficulty using hands for taps, buttons, cutlery, and/or writing: Reports   Review of Systems  Constitutional: Negative for fatigue.  HENT: Negative for mouth sores and mouth dryness.   Eyes: Negative for dryness.  Respiratory: Negative for shortness of breath.   Gastrointestinal: Negative for constipation and diarrhea.  Musculoskeletal: Negative for myalgias and myalgias.  Skin: Negative for sensitivity to sunlight.  Psychiatric/Behavioral: Negative for decreased concentration and sleep disturbance.    PMFS History:  Patient Active Problem List   Diagnosis Date Noted  . Primary osteoarthritis of both hands 07/22/2016  . DDD lumbar spine 07/22/2016  . Trigger finger  07/10/2016  . Psoriatic arthropathy ([place]) 07/10/2016  . High risk medication use 06/13/2016  . Primary osteoarthritis of both knees 06/13/2016  . Osteoarthritis of lumbar spine 06/13/2016  . History of gastric bypass 06/13/2016  . Vitamin D deficiency 06/13/2016  . Psoriasis 02/18/2013  . 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 Anxiety   . Arthritis   . GERD (gastroesophageal reflux disease)   . History of kidney stones   . Pelvic mass     Family History  Problem Relation Age of Onset  . Heart disease Father   . Arthritis Father   . Psoriasis Father   . Parkinsonism Father   . Other Father 67    pacemaker  . Colon cancer Paternal Grandfather   . Heart failure Paternal Grandmother   . Stroke Maternal Grandmother   . Esophageal cancer Neg Hx   . Rectal cancer Neg Hx   . Stomach cancer Neg Hx    Past Surgical History:  Procedure Laterality Date  . ABDOMINAL HYSTERECTOMY    . APPENDECTOMY    . BACK SURGERY    . CHOLECYSTECTOMY    . EXPLORATORY LAPAROTOMY    . GASTRIC BYPASS    . LITHOTRIPSY     Social History   Social History Narrative   Daily caffeine      Objective: Vital Signs: BP 120/70   Pulse 78   Resp 14  Wt 230 lb (104.3 kg)   BMI 37.12 kg/m    Physical Exam  Constitutional: She is oriented to person, place, and time. She appears well-developed and well-nourished.  HENT:  Head: Normocephalic and atraumatic.  Eyes: EOM are normal. Pupils are equal, round, and reactive to light.  Cardiovascular: Normal rate, regular rhythm and normal heart sounds.  Exam reveals no gallop and no friction rub.   No murmur heard. Pulmonary/Chest: Effort normal and breath sounds normal. She has no wheezes. She has no rales.  Abdominal: Soft. Bowel sounds are normal. She exhibits no distension. There is no tenderness. There is no guarding. No hernia.  Musculoskeletal: Normal range of motion. She exhibits no edema,  tenderness or deformity.  Lymphadenopathy:    She has no cervical adenopathy.  Neurological: She is alert and oriented to person, place, and time. Coordination normal.  Skin: Skin is warm and dry. Capillary refill takes less than 2 seconds. No rash noted.  Psychiatric: She has a normal mood and affect. Her behavior is normal.  Nursing note and vitals reviewed.    Musculoskeletal Exam:  Full range of motion of all joints Grip strength is equal and strong bilaterally Fibromyalgia tender points are all absent  CDAI Exam: CDAI Homunculus Exam:   Joint Counts:  CDAI Tender Joint count: 0 CDAI Swollen Joint count: 0  Global Assessments:  Patient Global Assessment: 7 Provider Global Assessment: 7  CDAI Calculated Score: 14    Investigation: Findings:  Enbrel every week by Dr. Ubaldo Glassing TB gold negative June 2017   CBC Latest Ref Rng & Units 05/15/2016 05/06/2007 05/05/2007  WBC 3.8 - 10.8 K/uL 6.5 - -  Hemoglobin 11.7 - 15.5 g/dL 12.1 12.0 12.9  Hematocrit 35.0 - 45.0 % 37.8 34.1(L) 36.5  Platelets 140 - 400 K/uL 313 - -     CMP Latest Ref Rng & Units 05/15/2016 05/05/2007  Glucose 65 - 99 mg/dL 93 83  BUN 7 - 25 mg/dL 13 9  Creatinine 0.50 - 0.99 mg/dL 0.72 0.61  Sodium 135 - 146 mmol/L 140 140  Potassium 3.5 - 5.3 mmol/L 5.0 3.7  Chloride 98 - 110 mmol/L 104 107  CO2 20 - 31 mmol/L 28 26  Calcium 8.6 - 10.4 mg/dL 9.5 8.7  Total Protein 6.1 - 8.1 g/dL 7.0 -  Total Bilirubin 0.2 - 1.2 mg/dL 0.2 -  Alkaline Phos 33 - 130 U/L 97 -  AST 10 - 35 U/L 16 -  ALT 6 - 29 U/L 16 -    Imaging: No results found.     Bilateral elbows with a history of psoriasis. Currently moderately to fairly well controlled with ongoing small lesion to bilateral elbows with the left worse than the right.  Speciality Comments: No specialty comments available.    Procedures:  Large Joint Inj Date/Time: 09/24/2016 11:56 AM Performed by: Eliezer Lofts Authorized by: Eliezer Lofts    Consent Given by:  Patient Site marked: the procedure site was marked   Timeout: prior to procedure the correct patient, procedure, and site was verified   Indications:  Pain and joint swelling Location:  Knee Site:  R knee Prep: patient was prepped and draped in usual sterile fashion   Needle Size:  27 G Needle Length:  1.5 inches Approach:  Medial Ultrasound Guidance: No   Fluoroscopic Guidance: No   Arthrogram: No   Medications:  40 mg triamcinolone acetonide 40 MG/ML; 2 mL lidocaine 1 % Aspiration Attempted: Yes   Aspirate amount (mL):  0 Patient tolerance:  Patient tolerated the procedure well with no immediate complications  Right knee pain. Was injected with 2 mL's of 1% Xylocaine without epinephrine mixed with 40 mg of Kenalog.    Allergies: Dilaudid [hydromorphone hcl] and Penicillins   Assessment / Plan:     Visit Diagnoses: Psoriatic arthropathy ([place])  Psoriasis  Primary osteoarthritis of both knees  Primary osteoarthritis of both hands  Osteoarthritis of lumbar spine, unspecified spinal osteoarthritis complication status  DDD lumbar spine  Trigger index finger of left hand  High risk medication use - Plan: CBC with Differential/Platelet, COMPLETE METABOLIC PANEL WITH GFR, Quantiferon tb gold assay (blood)  History of gastric bypass  Vitamin D deficiency  High risk medication use - Enbrel every week by Dr. Ubaldo Glassing, clobetasol cream - Plan: CBC with Differential/Platelet, COMPLETE METABOLIC PANEL WITH GFR, Quantiferon tb gold assay (blood)    Plan: #1: Psoriatic arthritis. Doing well. No joint pain swelling and stiffness. Using monotherapy of Enbrel and having adequate response  #2: Psoriasis. Few lesions to bilateral elbows and lower extremities which are not bothering the patient. The larger lesions on bilateral elbows with benefit from clobetasol cream. Patient is agreeable to use it 3-7 days twice a day.  #3: High-risk prescription. On  Enbrel monotherapy. Response. She was on dual therapy at one time: (Enbrel and methotrexate). We had to stop the methotrexate because even the low-dose would make her sick on her stomach.  #4: Left knee joint pain. OA of the left knee joint. Cortisone injection about 2-3 months ago really help the patient.  #5: Right knee joint pain. Consistent with osteoarthritis. Is requesting a cortisone injection today. See procedure note for full details. After informed consent was obtained the site was prepped in usual fashion injected with 40 mg of Kenalog mixed with 2 mL's 1% lidocaine without epinephrine. Patient tolerated procedure well. The There is no complication  #6: Patient is due for labs today. CBC with differential and CMP with GFR. She is also due for TB gold which we will do today.  #7: Patient is planning on a trip to the [place] for about 21 days in a couple of months. I've advised her to consider using Shields brace for bilateral knees.  #8: Return to clinic in 5 months  Orders: Orders Placed This Encounter  Procedures  . Large Joint Injection/Arthrocentesis   No orders of the defined types were placed in this encounter.   Face-to-face time spent with patient was 30 minutes. 50% of time was spent in counseling and coordination of care.  Follow-Up Instructions: Return in about 5 months (around 02/24/2017).   Eliezer Lofts, PA-C  Note - This record has been created using [employer].  Chart creation errors have been sought, but may not always  have been located. Such creation errors do not reflect on  the standard of medical care.

## 2016-09-24 ENCOUNTER — Ambulatory Visit (INDEPENDENT_AMBULATORY_CARE_PROVIDER_SITE_OTHER): Payer: Managed Care, Other (non HMO) | Admitting: Rheumatology

## 2016-09-24 ENCOUNTER — Encounter: Payer: Self-pay | Admitting: Rheumatology

## 2016-09-24 VITALS — BP 120/70 | HR 78 | Resp 14 | Wt 230.0 lb

## 2016-09-24 DIAGNOSIS — Z79899 Other long term (current) drug therapy: Secondary | ICD-10-CM | POA: Diagnosis not present

## 2016-09-24 DIAGNOSIS — M47816 Spondylosis without myelopathy or radiculopathy, lumbar region: Secondary | ICD-10-CM

## 2016-09-24 DIAGNOSIS — M19042 Primary osteoarthritis, left hand: Secondary | ICD-10-CM

## 2016-09-24 DIAGNOSIS — M19041 Primary osteoarthritis, right hand: Secondary | ICD-10-CM

## 2016-09-24 DIAGNOSIS — L409 Psoriasis, unspecified: Secondary | ICD-10-CM

## 2016-09-24 DIAGNOSIS — E559 Vitamin D deficiency, unspecified: Secondary | ICD-10-CM

## 2016-09-24 DIAGNOSIS — M25561 Pain in right knee: Secondary | ICD-10-CM

## 2016-09-24 DIAGNOSIS — Z9884 Bariatric surgery status: Secondary | ICD-10-CM

## 2016-09-24 DIAGNOSIS — M170 Bilateral primary osteoarthritis of knee: Secondary | ICD-10-CM

## 2016-09-24 DIAGNOSIS — L4050 Arthropathic psoriasis, unspecified: Secondary | ICD-10-CM | POA: Diagnosis not present

## 2016-09-24 DIAGNOSIS — M65322 Trigger finger, left index finger: Secondary | ICD-10-CM

## 2016-09-24 LAB — CBC WITH DIFFERENTIAL/PLATELET
BASOS PCT: 1 %
Basophils Absolute: 69 cells/uL (ref 0–200)
EOS PCT: 3 %
Eosinophils Absolute: 207 cells/uL (ref 15–500)
HCT: 37.8 % (ref 35.0–45.0)
Hemoglobin: 12.5 g/dL (ref 11.7–15.5)
LYMPHS PCT: 49 %
Lymphs Abs: 3381 cells/uL (ref 850–3900)
MCH: 26.5 pg — ABNORMAL LOW (ref 27.0–33.0)
MCHC: 33.1 g/dL (ref 32.0–36.0)
MCV: 80.3 fL (ref 80.0–100.0)
MONOS PCT: 9 %
MPV: 10.5 fL (ref 7.5–12.5)
Monocytes Absolute: 621 cells/uL (ref 200–950)
NEUTROS ABS: 2622 {cells}/uL (ref 1500–7800)
Neutrophils Relative %: 38 %
PLATELETS: 311 10*3/uL (ref 140–400)
RBC: 4.71 MIL/uL (ref 3.80–5.10)
RDW: 14.6 % (ref 11.0–15.0)
WBC: 6.9 10*3/uL (ref 3.8–10.8)

## 2016-09-24 MED ORDER — TRIAMCINOLONE ACETONIDE 40 MG/ML IJ SUSP
40.0000 mg | INTRAMUSCULAR | Status: AC | PRN
  Administered 2016-09-24: 40 mg via INTRA_ARTICULAR

## 2016-09-24 MED ORDER — LIDOCAINE HCL 1 % IJ SOLN
2.0000 mL | INTRAMUSCULAR | Status: AC | PRN
  Administered 2016-09-24: 2 mL

## 2016-09-24 NOTE — Telephone Encounter (Signed)
Applied for Hyalgan Bil, pending benefits

## 2016-09-24 NOTE — Patient Instructions (Signed)
Knee Exercises Ask your health care provider which exercises are safe for you. Do exercises exactly as told by your health care provider and adjust them as directed. It is normal to feel mild stretching, pulling, tightness, or discomfort as you do these exercises, but you should stop right away if you feel sudden pain or your pain gets worse.Do not begin these exercises until told by your health care provider. STRETCHING AND RANGE OF MOTION EXERCISES  These exercises warm up your muscles and joints and improve the movement and flexibility of your knee. These exercises also help to relieve pain, numbness, and tingling. Exercise A: Knee Extension, Prone  1. Lie on your abdomen on a bed. 2. Place your left / right knee just beyond the edge of the surface so your knee is not on the bed. You can put a towel under your left / right thigh just above your knee for comfort. 3. Relax your leg muscles and allow gravity to straighten your knee. You should feel a stretch behind your left / right knee. 4. Hold this position for __________ seconds. 5. Scoot up so your knee is supported between repetitions. Repeat __________ times. Complete this stretch __________ times a day. Exercise B: Knee Flexion, Active   1. Lie on your back with both knees straight. If this causes back discomfort, bend your left / right knee so your foot is flat on the floor. 2. Slowly slide your left / right heel back toward your buttocks until you feel a gentle stretch in the front of your knee or thigh. 3. Hold this position for __________ seconds. 4. Slowly slide your left / right heel back to the starting position. Repeat __________ times. Complete this exercise __________ times a day. Exercise C: Quadriceps, Prone   1. Lie on your abdomen on a firm surface, such as a bed or padded floor. 2. Bend your left / right knee and hold your ankle. If you cannot reach your ankle or pant leg, loop a belt around your foot and grab the belt  instead. 3. Gently pull your heel toward your buttocks. Your knee should not slide out to the side. You should feel a stretch in the front of your thigh and knee. 4. Hold this position for __________ seconds. Repeat __________ times. Complete this stretch __________ times a day. Exercise D: Hamstring, Supine  1. Lie on your back. 2. Loop a belt or towel over the ball of your left / right foot. The ball of your foot is on the walking surface, right under your toes. 3. Straighten your left / right knee and slowly pull on the belt to raise your leg until you feel a gentle stretch behind your knee.  Do not let your left / right knee bend while you do this.  Keep your other leg flat on the floor. 4. Hold this position for __________ seconds. Repeat __________ times. Complete this stretch __________ times a day. STRENGTHENING EXERCISES  These exercises build strength and endurance in your knee. Endurance is the ability to use your muscles for a long time, even after they get tired. Exercise E: Quadriceps, Isometric   1. Lie on your back with your left / right leg extended and your other knee bent. Put a rolled towel or small pillow under your knee if told by your health care provider. 2. Slowly tense the muscles in the front of your left / right thigh. You should see your kneecap slide up toward your hip or see   increased dimpling just above the knee. This motion will push the back of the knee toward the floor. 3. For __________ seconds, keep the muscle as tight as you can without increasing your pain. 4. Relax the muscles slowly and completely. Repeat __________ times. Complete this exercise __________ times a day. Exercise F: Straight Leg Raises - Quadriceps  1. Lie on your back with your left / right leg extended and your other knee bent. 2. Tense the muscles in the front of your left / right thigh. You should see your kneecap slide up or see increased dimpling just above the knee. Your thigh  may even shake a bit. 3. Keep these muscles tight as you raise your leg 4-6 inches (10-15 cm) off the floor. Do not let your knee bend. 4. Hold this position for __________ seconds. 5. Keep these muscles tense as you lower your leg. 6. Relax your muscles slowly and completely after each repetition. Repeat __________ times. Complete this exercise __________ times a day. Exercise G: Hamstring, Isometric  1. Lie on your back on a firm surface. 2. Bend your left / right knee approximately __________ degrees. 3. Dig your left / right heel into the surface as if you are trying to pull it toward your buttocks. Tighten the muscles in the back of your thighs to dig as hard as you can without increasing any pain. 4. Hold this position for __________ seconds. 5. Release the tension gradually and allow your muscles to relax completely for __________ seconds after each repetition. Repeat __________ times. Complete this exercise __________ times a day. Exercise H: Hamstring Curls   If told by your health care provider, do this exercise while wearing ankle weights. Begin with __________ weights. Then increase the weight by 1 lb (0.5 kg) increments. Do not wear ankle weights that are more than __________. 1. Lie on your abdomen with your legs straight. 2. Bend your left / right knee as far as you can without feeling pain. Keep your hips flat against the floor. 3. Hold this position for __________ seconds. 4. Slowly lower your leg to the starting position. Repeat __________ times. Complete this exercise __________ times a day. Exercise I: Squats (Quadriceps)  1. Stand in front of a table, with your feet and knees pointing straight ahead. You may rest your hands on the table for balance but not for support. 2. Slowly bend your knees and lower your hips like you are going to sit in a chair.  Keep your weight over your heels, not over your toes.  Keep your lower legs upright so they are parallel with the  table legs.  Do not let your hips go lower than your knees.  Do not bend lower than told by your health care provider.  If your knee pain increases, do not bend as low. 3. Hold the squat position for __________ seconds. 4. Slowly push with your legs to return to standing. Do not use your hands to pull yourself to standing. Repeat __________ times. Complete this exercise __________ times a day. Exercise J: Wall Slides (Quadriceps)   1. Lean your back against a smooth wall or door while you walk your feet out 18-24 inches (46-61 cm) from it. 2. Place your feet hip-width apart. 3. Slowly slide down the wall or door until your knees Repeat __________ times. Complete this exercise __________ times a day. 4. Exercise K: Straight Leg Raises - Hip Abductors  1. Lie on your side with your left / right leg   in the top position. Lie so your head, shoulder, knee, and hip line up. You may bend your bottom knee to help you keep your balance. 2. Roll your hips slightly forward so your hips are stacked directly over each other and your left / right knee is facing forward. 3. Leading with your heel, lift your top leg 4-6 inches (10-15 cm). You should feel the muscles in your outer hip lifting.  Do not let your foot drift forward.  Do not let your knee roll toward the ceiling. 4. Hold this position for __________ seconds. 5. Slowly return your leg to the starting position. 6. Let your muscles relax completely after each repetition. Repeat __________ times. Complete this exercise __________ times a day. Exercise L: Straight Leg Raises - Hip Extensors  1. Lie on your abdomen on a firm surface. You can put a pillow under your hips if that is more comfortable. 2. Tense the muscles in your buttocks and lift your left / right leg about 4-6 inches (10-15 cm). Keep your knee straight as you lift your leg. 3. Hold this position for __________ seconds. 4. Slowly lower your leg to the starting position. 5. Let  your leg relax completely after each repetition. Repeat __________ times. Complete this exercise __________ times a day. This information is not intended to replace advice given to you by your health care provider. Make sure you discuss any questions you have with your health care provider. Document Released: 03/20/2005 Document Revised: 01/29/2016 Document Reviewed: 03/12/2015 Elsevier Interactive Patient Education  2017 Elsevier Inc.  

## 2016-09-25 LAB — COMPLETE METABOLIC PANEL WITH GFR
ALT: 24 U/L (ref 6–29)
AST: 23 U/L (ref 10–35)
Albumin: 4.4 g/dL (ref 3.6–5.1)
Alkaline Phosphatase: 84 U/L (ref 33–130)
BILIRUBIN TOTAL: 0.3 mg/dL (ref 0.2–1.2)
BUN: 13 mg/dL (ref 7–25)
CHLORIDE: 104 mmol/L (ref 98–110)
CO2: 26 mmol/L (ref 20–31)
CREATININE: 0.63 mg/dL (ref 0.50–0.99)
Calcium: 9.7 mg/dL (ref 8.6–10.4)
GFR, Est African American: >89 mL/min (ref >=60)
GLUCOSE: 85 mg/dL (ref 65–99)
Potassium: 4.5 mmol/L (ref 3.5–5.3)
SODIUM: 142 mmol/L (ref 135–146)
TOTAL PROTEIN: 7.4 g/dL (ref 6.1–8.1)

## 2016-09-26 LAB — QUANTIFERON TB GOLD ASSAY (BLOOD)
Interferon Gamma Release Assay: NEGATIVE
MITOGEN-NIL SO: 3.67 [IU]/mL
QUANTIFERON NIL VALUE: 0.06 [IU]/mL
QUANTIFERON TB AG MINUS NIL: 0.03 [IU]/mL

## 2016-09-26 NOTE — Progress Notes (Signed)
WNL

## 2016-09-30 ENCOUNTER — Telehealth: Payer: Self-pay | Admitting: Rheumatology

## 2016-09-30 NOTE — Telephone Encounter (Signed)
Patient would like to know if she can get a cortisone injection in her left knee. She doesn't remember when she had the last injection and doesn't know if she is eligible yet. Patient says she is leaving Saturday for a 3 week trip and would really like to get the injection before leaving, if she can. Please advise.

## 2016-09-30 NOTE — Telephone Encounter (Signed)
Attempted to contact the patient and left message for patient to call the office, to offer patient appointment for 10/01/16 at 3:15 pm.

## 2016-10-01 ENCOUNTER — Encounter: Payer: Self-pay | Admitting: Rheumatology

## 2016-10-01 ENCOUNTER — Ambulatory Visit (INDEPENDENT_AMBULATORY_CARE_PROVIDER_SITE_OTHER): Payer: Managed Care, Other (non HMO) | Admitting: Rheumatology

## 2016-10-01 ENCOUNTER — Telehealth (INDEPENDENT_AMBULATORY_CARE_PROVIDER_SITE_OTHER): Payer: Self-pay | Admitting: *Deleted

## 2016-10-01 VITALS — BP 130/72 | HR 80

## 2016-10-01 DIAGNOSIS — M25562 Pain in left knee: Secondary | ICD-10-CM | POA: Diagnosis not present

## 2016-10-01 MED ORDER — TRIAMCINOLONE ACETONIDE 40 MG/ML IJ SUSP
40.0000 mg | INTRAMUSCULAR | Status: AC | PRN
  Administered 2016-10-01: 40 mg via INTRA_ARTICULAR

## 2016-10-01 MED ORDER — LIDOCAINE HCL 1 % IJ SOLN
1.5000 mL | INTRAMUSCULAR | Status: AC | PRN
  Administered 2016-10-01: 1.5 mL

## 2016-10-01 NOTE — Telephone Encounter (Signed)
Patient has been scheduled for appointment 10/01/16 at 3:15 pm

## 2016-10-01 NOTE — Progress Notes (Deleted)
Last left knee injection was 07/24/2016

## 2016-10-01 NOTE — Telephone Encounter (Signed)
Specialty pharm calling regarding status of prior auth. [id number]. Thank you.

## 2016-10-01 NOTE — Progress Notes (Signed)
   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 She started having pain and swelling in her left knee joint. She wanted to get a cortisone injection and she is going on a vacation to [place]. Her last injection was in the beginning of March. I usually like to wait 3 months because she was going on vacation we made an exception to inject her today. She denies any history of any fever or chills or infection. There was no history of any injury.    Procedures: Visit Diagnoses: Blood pressure 130/72  Large Joint Inj Date/Time: 10/01/2016 4:03 PM Performed by: Bo Merino Authorized by: Bo Merino   Consent Given by:  Patient Site marked: the procedure site was marked   Timeout: prior to procedure the correct patient, procedure, and site was verified   Indications:  Pain and joint swelling Location:  Knee Site:  L knee Prep: patient was prepped and draped in usual sterile fashion   Needle Size:  27 G Needle Length:  1.5 inches Approach:  Medial Ultrasound Guidance: No   Fluoroscopic Guidance: No   Arthrogram: No   Medications:  1.5 mL lidocaine 1 %; 40 mg triamcinolone acetonide 40 MG/ML Aspiration Attempted: Yes   Aspirate amount (mL):  0 Patient tolerance:  Patient tolerated the procedure well with no immediate complications  I advised her to contact me if her symptoms persist. Bo Merino, MD

## 2016-10-07 NOTE — Telephone Encounter (Signed)
Orthovisc application submitted on  Friday 10/04/16 by Wendy May.

## 2016-10-07 NOTE — Telephone Encounter (Signed)
I think you are working on this one already?

## 2016-10-14 ENCOUNTER — Telehealth (INDEPENDENT_AMBULATORY_CARE_PROVIDER_SITE_OTHER): Payer: Self-pay | Admitting: Radiology

## 2016-10-14 NOTE — Telephone Encounter (Signed)
PA submitted online at [facility] Prom[facility] PA for bilateral orthovisc injections x 4, will followup.  This is for buy and bill.

## 2016-10-21 NOTE — Telephone Encounter (Signed)
Approved, Josem Kaufmann #[id number], valid 10/15/16 through 11/26/2016.  Can you please call patient and advise this is approved and then schedule her for buy and bill orthovisc bilateral knees x 4 injections each knee? The last injection needs to be done by 11/26/2016.  Thanks.

## 2016-10-31 ENCOUNTER — Telehealth: Payer: Self-pay

## 2016-11-01 NOTE — Telephone Encounter (Signed)
Made in error

## 2016-11-05 NOTE — Telephone Encounter (Signed)
Cassidy Reeves, is there any way that we can get the need for her last injection pushed out another week?  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  She needs a total of 4 injections.

## 2016-11-06 ENCOUNTER — Ambulatory Visit (INDEPENDENT_AMBULATORY_CARE_PROVIDER_SITE_OTHER): Payer: Managed Care, Other (non HMO) | Admitting: Rheumatology

## 2016-11-06 DIAGNOSIS — M25562 Pain in left knee: Secondary | ICD-10-CM

## 2016-11-06 DIAGNOSIS — M25561 Pain in right knee: Secondary | ICD-10-CM | POA: Diagnosis not present

## 2016-11-06 DIAGNOSIS — M170 Bilateral primary osteoarthritis of knee: Secondary | ICD-10-CM | POA: Diagnosis not present

## 2016-11-06 MED ORDER — LIDOCAINE HCL 1 % IJ SOLN
1.5000 mL | INTRAMUSCULAR | Status: AC | PRN
  Administered 2016-11-06: 1.5 mL

## 2016-11-06 MED ORDER — HYALURONAN 30 MG/2ML IX SOSY
30.0000 mg | PREFILLED_SYRINGE | INTRA_ARTICULAR | Status: AC | PRN
  Administered 2016-11-06: 30 mg via INTRA_ARTICULAR

## 2016-11-06 NOTE — Progress Notes (Signed)
   Procedure Note  Patient: Cassidy Reeves             Date of Birth: 1955/06/21           MRN: [id number]             Visit Date: 11/06/2016  Procedures: Visit Diagnoses: Primary osteoarthritis of both knees  Acute pain of both knees  Large Joint Inj Date/Time: 11/06/2016 3:27 PM Performed by: Eliezer Lofts Authorized by: Eliezer Lofts   Consent Given by:  Patient Site marked: the procedure site was marked   Timeout: prior to procedure the correct patient, procedure, and site was verified   Indications:  Pain and joint swelling Location:  Knee Prep: patient was prepped and draped in usual sterile fashion   Needle Size:  27 G Needle Length:  1.5 inches Approach:  Medial Ultrasound Guidance: No   Fluoroscopic Guidance: No   Arthrogram: No   Medications:  1.5 mL lidocaine 1 %; 30 mg Hyaluronan 30 MG/2ML Aspiration Attempted: Yes   Patient tolerance:  Patient tolerated the procedure well with no immediate complications   Orthovisc No. 1 both knees This is clinic purchased and we will build patient [id number]   Large Joint Inj Date/Time: 11/06/2016 3:36 PM Performed by: Eliezer Lofts Authorized by: Eliezer Lofts   Consent Given by:  Patient Site marked: the procedure site was marked   Timeout: prior to procedure the correct patient, procedure, and site was verified   Indications:  Pain and joint swelling Location:  Knee Site:  L knee Prep: patient was prepped and draped in usual sterile fashion   Needle Size:  27 G Needle Length:  1.5 inches Approach:  Medial Ultrasound Guidance: No   Fluoroscopic Guidance: No   Arthrogram: No   Medications:  1.5 mL lidocaine 1 %; 30 mg Hyaluronan 30 MG/2ML Aspiration Attempted: Yes   Patient tolerance:  Patient tolerated the procedure well with no immediate complications   Orthovisc No. 1 both knees This is clinic purchased and we will build patient [phone]

## 2016-11-07 ENCOUNTER — Telehealth: Payer: Self-pay | Admitting: Rheumatology

## 2016-11-07 NOTE — Telephone Encounter (Signed)
Talked with Cassidy Reeves with Pre-certification dept.concerning an extension for patient to have her injection to expire a week after July 10th.  Expiration date has been extended to 12/03/16 per [facility] with pre-certification and we will receive another letter with the new expiration dates for injections.

## 2016-11-07 NOTE — Telephone Encounter (Signed)
Patient left a message yesterday stating that her insurance is willing to push expiration date for her injection out another week.  But they are needing [place] to call the Precertification line [phone] to get that all set up.  CB#[phone].  Thank you.

## 2016-11-08 NOTE — Progress Notes (Deleted)
Office Visit Note  Patient: Cassidy Reeves             Date of Birth: December 22, 1955           MRN: [id number]             PCP: Haywood Pao, MD Referring: Haywood Pao, MD Visit Date: 11/14/2016 Occupation: @GUAROCC @    Subjective:  No chief complaint on file.   History of Present Illness: Cassidy Reeves is a 61 y.o. female ***   Activities of Daily Living:  Patient reports morning stiffness for *** {minute/hour:19697}.   Patient {ACTIONS;DENIES/REPORTS:21021675::"Denies"} nocturnal pain.  Difficulty dressing/grooming: {ACTIONS;DENIES/REPORTS:21021675::"Denies"} Difficulty climbing stairs: {ACTIONS;DENIES/REPORTS:21021675::"Denies"} Difficulty getting out of chair: {ACTIONS;DENIES/REPORTS:21021675::"Denies"} Difficulty using hands for taps, buttons, cutlery, and/or writing: {ACTIONS;DENIES/REPORTS:21021675::"Denies"}   No Rheumatology ROS completed.   PMFS History:  Patient Active Problem List   Diagnosis Date Noted  . Primary osteoarthritis of both hands 07/22/2016  . DDD lumbar spine 07/22/2016  . Trigger finger 07/10/2016  . 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 High risk medication use 06/13/2016  . Primary osteoarthritis of both knees 06/13/2016  . History of gastric bypass 06/13/2016  . Vitamin D deficiency 06/13/2016  . Psoriasis 02/18/2013  . 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 Anxiety   . Arthritis   . GERD (gastroesophageal reflux disease)   . History of kidney stones   . Pelvic mass     Family History  Problem Relation Age of Onset  . Heart disease Father   . Arthritis Father   . Psoriasis Father   . Parkinsonism Father   . Other Father 25       pacemaker  . Colon cancer Paternal Grandfather   . Heart failure Paternal Grandmother   . Stroke Maternal Grandmother   . Esophageal cancer Neg Hx   . Rectal cancer Neg Hx   . Stomach cancer Neg Hx    Past Surgical History:  Procedure  Laterality Date  . ABDOMINAL HYSTERECTOMY    . APPENDECTOMY    . BACK SURGERY    . CHOLECYSTECTOMY    . EXPLORATORY LAPAROTOMY    . GASTRIC BYPASS    . LITHOTRIPSY     Social History   Social History Narrative   Daily caffeine      Objective: Vital Signs: There were no vitals taken for this visit.   Physical Exam   Musculoskeletal Exam: ***  CDAI Exam: No CDAI exam completed.    Investigation: No additional findings. CBC Latest Ref Rng & Units 09/24/2016 05/15/2016 05/06/2007  WBC 3.8 - 10.8 K/uL 6.9 6.5 -  Hemoglobin 11.7 - 15.5 g/dL 12.5 12.1 12.0  Hematocrit 35.0 - 45.0 % 37.8 37.8 34.1(L)  Platelets 140 - 400 K/uL 311 313 -   CMP Latest Ref Rng & Units 09/24/2016 05/15/2016 05/05/2007  Glucose 65 - 99 mg/dL 85 93 83  BUN 7 - 25 mg/dL 13 13 9   Creatinine 0.50 - 0.99 mg/dL 0.63 0.72 0.61  Sodium 135 - 146 mmol/L 142 140 140  Potassium 3.5 - 5.3 mmol/L 4.5 5.0 3.7  Chloride 98 - 110 mmol/L 104 104 107  CO2 20 - 31 mmol/L 26 28 26   Calcium 8.6 - 10.4 mg/dL 9.7 9.5 8.7  Total Protein 6.1 - 8.1 g/dL 7.4 7.0 -  Total Bilirubin 0.2 - 1.2 mg/dL 0.3 0.2 -  Alkaline Phos 33 - 130 U/L 84 97 -  AST 10 -  35 U/L 23 16 -  ALT 6 - 29 U/L 24 16 -    Imaging: No results found.  Speciality Comments: No specialty comments available.    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  No orders of the defined types were placed in this encounter.   Face-to-face time spent with patient was *** minutes. 50% of time was spent in counseling and coordination of care.  Follow-Up Instructions: No Follow-up on file.   Chantil Bari, RT  Note - This  record has been created using [employer].  Chart creation errors have been sought, but may not always  have been located. Such creation errors do not reflect on  the standard of medical care.

## 2016-11-12 DIAGNOSIS — M1712 Unilateral primary osteoarthritis, left knee: Secondary | ICD-10-CM | POA: Diagnosis not present

## 2016-11-12 DIAGNOSIS — M1711 Unilateral primary osteoarthritis, right knee: Secondary | ICD-10-CM

## 2016-11-12 NOTE — Progress Notes (Signed)
   Procedure Note  Patient: Cassidy Reeves             Date of Birth: April 20, 1956           MRN: [id number]             Visit Date: 11/14/2016  Procedures: Visit Diagnoses: Primary osteoarthritis of both knees Orthovisc #2  Large Joint Inj Date/Time: 11/12/2016 3:04 PM Performed by: Bo Merino Authorized by: Bo Merino   Consent Given by:  Patient Site marked: the procedure site was marked   Timeout: prior to procedure the correct patient, procedure, and site was verified   Indications:  Pain and joint swelling Location:  Knee Site:  R knee Prep: patient was prepped and draped in usual sterile fashion   Needle Size:  25 G Needle Length:  1.5 inches Approach:  Medial Ultrasound Guidance: No   Fluoroscopic Guidance: No   Arthrogram: No   Medications:  2 mL lidocaine 2 %; 30 mg Hyaluronan 30 MG/2ML Aspiration Attempted: Yes   Patient tolerance:  Patient tolerated the procedure well with no immediate complications Large Joint Inj Date/Time: 11/12/2016 3:04 PM Performed by: Bo Merino Authorized by: Bo Merino   Consent Given by:  Patient Site marked: the procedure site was marked   Timeout: prior to procedure the correct patient, procedure, and site was verified   Indications:  Pain and joint swelling Location:  Knee Site:  L knee Prep: patient was prepped and draped in usual sterile fashion   Needle Size:  25 G Needle Length:  1.5 inches Approach:  Medial Ultrasound Guidance: No   Fluoroscopic Guidance: No   Arthrogram: No   Medications:  2 mL lidocaine 2 %; 30 mg Hyaluronan 30 MG/2ML Aspiration Attempted: Yes   Patient tolerance:  Patient tolerated the procedure well with no immediate complications  Bo Merino, MD

## 2016-11-13 ENCOUNTER — Ambulatory Visit: Payer: Managed Care, Other (non HMO) | Admitting: Rheumatology

## 2016-11-13 NOTE — Telephone Encounter (Signed)
I called to do this, and it has already been extended through 12/03/16.  I guess someone at [facility] called?  Thanks.

## 2016-11-14 ENCOUNTER — Encounter (INDEPENDENT_AMBULATORY_CARE_PROVIDER_SITE_OTHER): Payer: Self-pay

## 2016-11-14 ENCOUNTER — Ambulatory Visit (INDEPENDENT_AMBULATORY_CARE_PROVIDER_SITE_OTHER): Payer: Managed Care, Other (non HMO) | Admitting: Rheumatology

## 2016-11-14 DIAGNOSIS — M170 Bilateral primary osteoarthritis of knee: Secondary | ICD-10-CM

## 2016-11-14 DIAGNOSIS — M1712 Unilateral primary osteoarthritis, left knee: Secondary | ICD-10-CM | POA: Diagnosis not present

## 2016-11-14 DIAGNOSIS — M1711 Unilateral primary osteoarthritis, right knee: Secondary | ICD-10-CM | POA: Diagnosis not present

## 2016-11-14 MED ORDER — LIDOCAINE HCL 2 % IJ SOLN
2.0000 mL | INTRAMUSCULAR | Status: AC | PRN
  Administered 2016-11-12: 2 mL

## 2016-11-14 MED ORDER — HYALURONAN 30 MG/2ML IX SOSY
30.0000 mg | PREFILLED_SYRINGE | INTRA_ARTICULAR | Status: AC | PRN
  Administered 2016-11-12: 30 mg via INTRA_ARTICULAR

## 2016-11-21 DIAGNOSIS — M170 Bilateral primary osteoarthritis of knee: Secondary | ICD-10-CM

## 2016-11-21 NOTE — Progress Notes (Signed)
   Procedure Note  Patient: Cassidy Reeves             Date of Birth: [birth year]           MRN: [id number]             Visit Date: 11/26/2016  Procedures: Visit Diagnoses: Primary osteoarthritis of both knees - Plan: Large Joint Injection/Arthrocentesis, Large Joint Injection/Arthrocentesis Orthovisc #3 Bilateral  Large Joint Inj Date/Time: 11/21/2016 8:20 AM Performed by: Bo Merino Authorized by: Bo Merino   Consent Given by:  Patient Site marked: the procedure site was marked   Timeout: prior to procedure the correct patient, procedure, and site was verified   Indications:  Pain and joint swelling Location:  Knee Site:  R knee Prep: patient was prepped and draped in usual sterile fashion   Needle Size:  27 G Needle Length:  1.5 inches Approach:  Medial Ultrasound Guidance: No   Fluoroscopic Guidance: No   Arthrogram: No   Medications:  1.5 mL lidocaine 2 %; 30 mg Hyaluronan 30 MG/2ML Aspiration Attempted: Yes   Patient tolerance:  Patient tolerated the procedure well with no immediate complications Large Joint Inj Date/Time: 11/21/2016 8:20 AM Performed by: Bo Merino Authorized by: Bo Merino   Consent Given by:  Patient Site marked: the procedure site was marked   Timeout: prior to procedure the correct patient, procedure, and site was verified   Indications:  Pain and joint swelling Location:  Knee Site:  L knee Prep: patient was prepped and draped in usual sterile fashion   Needle Size:  27 G Needle Length:  1.5 inches Approach:  Medial Ultrasound Guidance: No   Fluoroscopic Guidance: No   Arthrogram: No   Medications:  30 mg Hyaluronan 30 MG/2ML; 1.5 mL lidocaine 2 % Aspiration Attempted: Yes   Patient tolerance:  Patient tolerated the procedure well with no immediate complications   Bo Merino, MD

## 2016-11-26 ENCOUNTER — Ambulatory Visit (INDEPENDENT_AMBULATORY_CARE_PROVIDER_SITE_OTHER): Payer: Managed Care, Other (non HMO) | Admitting: Rheumatology

## 2016-11-26 DIAGNOSIS — M170 Bilateral primary osteoarthritis of knee: Secondary | ICD-10-CM

## 2016-11-26 MED ORDER — LIDOCAINE HCL 2 % IJ SOLN
1.5000 mL | INTRAMUSCULAR | Status: AC | PRN
  Administered 2016-11-21: 1.5 mL

## 2016-11-26 MED ORDER — HYALURONAN 30 MG/2ML IX SOSY
30.0000 mg | PREFILLED_SYRINGE | INTRA_ARTICULAR | Status: AC | PRN
  Administered 2016-11-21: 30 mg via INTRA_ARTICULAR

## 2016-11-29 DIAGNOSIS — M1711 Unilateral primary osteoarthritis, right knee: Secondary | ICD-10-CM | POA: Diagnosis not present

## 2016-11-29 DIAGNOSIS — M1712 Unilateral primary osteoarthritis, left knee: Secondary | ICD-10-CM | POA: Diagnosis not present

## 2016-11-29 NOTE — Progress Notes (Signed)
   Procedure Note  Patient: Cassidy Reeves             Date of Birth: 1955/06/29           MRN: [id number]             Visit Date: 12/03/2016  Procedures: Visit Diagnoses: Primary osteoarthritis of both knees  Large Joint Inj Date/Time: 11/29/2016 11:01 AM Performed by: Bo Merino Authorized by: Bo Merino   Site marked: the procedure site was marked   Timeout: prior to procedure the correct patient, procedure, and site was verified   Indications:  Pain Location:  Knee Site:  R knee Prep: patient was prepped and draped in usual sterile fashion   Needle Size:  25 G Needle Length:  1.5 inches Ultrasound Guidance: No   Fluoroscopic Guidance: No   Arthrogram: No   Medications:  30 mg Hyaluronan 30 MG/2ML; 1.5 mL lidocaine 2 % Patient tolerance:  Patient tolerated the procedure well with no immediate complications Large Joint Inj Date/Time: 11/29/2016 11:01 AM Performed by: Bo Merino Authorized by: Bo Merino   Site marked: the procedure site was marked   Timeout: prior to procedure the correct patient, procedure, and site was verified   Indications:  Pain Location:  Knee Site:  L knee Prep: patient was prepped and draped in usual sterile fashion   Needle Size:  25 G Needle Length:  1.5 inches Ultrasound Guidance: No   Fluoroscopic Guidance: No   Arthrogram: No   Medications:  30 mg Hyaluronan 30 MG/2ML; 1.5 mL lidocaine 2 % Patient tolerance:  Patient tolerated the procedure well with no immediate complications  Bo Merino, MD Orthovisc #4 Bilateral knees

## 2016-12-03 ENCOUNTER — Ambulatory Visit (INDEPENDENT_AMBULATORY_CARE_PROVIDER_SITE_OTHER): Payer: Managed Care, Other (non HMO) | Admitting: Rheumatology

## 2016-12-03 DIAGNOSIS — M170 Bilateral primary osteoarthritis of knee: Secondary | ICD-10-CM

## 2016-12-03 MED ORDER — HYALURONAN 30 MG/2ML IX SOSY
30.0000 mg | PREFILLED_SYRINGE | INTRA_ARTICULAR | Status: AC | PRN
Start: 2016-11-29 — End: 2016-11-29
  Administered 2016-11-29: 30 mg via INTRA_ARTICULAR

## 2016-12-03 MED ORDER — LIDOCAINE HCL 2 % IJ SOLN
1.5000 mL | INTRAMUSCULAR | Status: AC | PRN
  Administered 2016-11-29: 1.5 mL

## 2016-12-11 ENCOUNTER — Telehealth: Payer: Self-pay | Admitting: Rheumatology

## 2016-12-11 NOTE — Telephone Encounter (Signed)
Patient has been scheduled to for an appointment on 12/13/16 at 9:15 am

## 2016-12-11 NOTE — Telephone Encounter (Signed)
Patient having lt knee pain/ rt hip gave out on her. Patient wants to know if she needs to come in for appt, or what can she do if she doesn't come in. Please advise.

## 2016-12-12 ENCOUNTER — Encounter: Payer: Self-pay | Admitting: Rheumatology

## 2016-12-12 ENCOUNTER — Other Ambulatory Visit: Payer: Self-pay | Admitting: *Deleted

## 2016-12-12 ENCOUNTER — Ambulatory Visit: Payer: Managed Care, Other (non HMO) | Admitting: Rheumatology

## 2016-12-12 ENCOUNTER — Ambulatory Visit (INDEPENDENT_AMBULATORY_CARE_PROVIDER_SITE_OTHER): Payer: Managed Care, Other (non HMO) | Admitting: Rheumatology

## 2016-12-12 ENCOUNTER — Ambulatory Visit (INDEPENDENT_AMBULATORY_CARE_PROVIDER_SITE_OTHER): Payer: Managed Care, Other (non HMO)

## 2016-12-12 VITALS — BP 119/75 | HR 74 | Ht 66.0 in | Wt 238.0 lb

## 2016-12-12 DIAGNOSIS — M170 Bilateral primary osteoarthritis of knee: Secondary | ICD-10-CM

## 2016-12-12 DIAGNOSIS — Z79899 Other long term (current) drug therapy: Secondary | ICD-10-CM

## 2016-12-12 DIAGNOSIS — M25551 Pain in right hip: Secondary | ICD-10-CM

## 2016-12-12 DIAGNOSIS — G8929 Other chronic pain: Secondary | ICD-10-CM

## 2016-12-12 DIAGNOSIS — M47818 Spondylosis without myelopathy or radiculopathy, sacral and sacrococcygeal region: Secondary | ICD-10-CM | POA: Diagnosis not present

## 2016-12-12 DIAGNOSIS — L4050 Arthropathic psoriasis, unspecified: Secondary | ICD-10-CM

## 2016-12-12 DIAGNOSIS — L409 Psoriasis, unspecified: Secondary | ICD-10-CM | POA: Diagnosis not present

## 2016-12-12 DIAGNOSIS — M25562 Pain in left knee: Principal | ICD-10-CM

## 2016-12-12 DIAGNOSIS — M461 Sacroiliitis, not elsewhere classified: Secondary | ICD-10-CM

## 2016-12-12 LAB — CBC WITH DIFFERENTIAL/PLATELET
Basophils Absolute: 128 cells/uL (ref 0–200)
Basophils Relative: 2 %
EOS PCT: 3 %
Eosinophils Absolute: 192 cells/uL (ref 15–500)
HEMATOCRIT: 37.8 % (ref 35.0–45.0)
Hemoglobin: 12.3 g/dL (ref 11.7–15.5)
LYMPHS ABS: 2880 {cells}/uL (ref 850–3900)
Lymphocytes Relative: 45 %
MCH: 26.3 pg — AB (ref 27.0–33.0)
MCHC: 32.5 g/dL (ref 32.0–36.0)
MCV: 80.9 fL (ref 80.0–100.0)
MPV: 10.6 fL (ref 7.5–12.5)
Monocytes Absolute: 896 cells/uL (ref 200–950)
Monocytes Relative: 14 %
NEUTROS ABS: 2304 {cells}/uL (ref 1500–7800)
Neutrophils Relative %: 36 %
PLATELETS: 336 10*3/uL (ref 140–400)
RBC: 4.67 MIL/uL (ref 3.80–5.10)
RDW: 14.6 % (ref 11.0–15.0)
WBC: 6.4 10*3/uL (ref 3.8–10.8)

## 2016-12-12 LAB — COMPLETE METABOLIC PANEL WITH GFR
ALT: 21 U/L (ref 6–29)
AST: 22 U/L (ref 10–35)
Albumin: 4.3 g/dL (ref 3.6–5.1)
Alkaline Phosphatase: 88 U/L (ref 33–130)
BILIRUBIN TOTAL: 0.3 mg/dL (ref 0.2–1.2)
BUN: 14 mg/dL (ref 7–25)
CALCIUM: 9.1 mg/dL (ref 8.6–10.4)
CHLORIDE: 103 mmol/L (ref 98–110)
CO2: 26 mmol/L (ref 20–31)
CREATININE: 0.66 mg/dL (ref 0.50–0.99)
GFR, Est African American: >89 mL/min (ref >=60)
Glucose, Bld: 85 mg/dL (ref 65–99)
Potassium: 4.8 mmol/L (ref 3.5–5.3)
Sodium: 140 mmol/L (ref 135–146)
Total Protein: 7.1 g/dL (ref 6.1–8.1)

## 2016-12-12 MED ORDER — TRIAMCINOLONE ACETONIDE 40 MG/ML IJ SUSP
40.0000 mg | INTRAMUSCULAR | Status: AC | PRN
  Administered 2016-12-12: 40 mg via INTRA_ARTICULAR

## 2016-12-12 MED ORDER — LIDOCAINE HCL 1 % IJ SOLN
1.5000 mL | INTRAMUSCULAR | Status: AC | PRN
  Administered 2016-12-12: 1.5 mL

## 2016-12-12 NOTE — Progress Notes (Signed)
Office Visit Note  Patient: Cassidy Reeves             Date of Birth: Oct 06, 1955           MRN: [id number]             PCP: Haywood Pao, MD Referring: Haywood Pao, MD Visit Date: 12/12/2016 Occupation: _0 @    Subjective:  Follow-up (follow up had injection for knees seems to help, had x ray may need MRI still not better constant pain, uses ice to help temp.) and Follow-up (last week r hip gave out going up steps, still sore)   History of Present Illness: Cassidy Reeves is a 61 y.o. female  With a history of psoriasis and psoriatic arthritis and who was last seen 09/24/2016. She is on Enbrel(monotherapy) twice a week prescribed by Dr. Ubaldo Glassing and patient was having adequate response on 09/24/2016 visit. (She was on methotrexate also a time before but we had to stop even the low-dose methotrexate because it was making her sick to her stomach). For her psoriasis lesions, patient was instructed to use her clobetasol cream for no more than 5-7 days at a stretch. Patient recently finished Orthovisc injections to both knees.  Adequate response with Enbrel twice a week prescribed by Dr. Ubaldo Glassing TB gold negative 09/19/2016  Today, she is doing well w/ PsA , and Ps. Taking Enbrel 2 times a week prescribed by Dr. Ubaldo Glassing and doing well with that. Her main complaint today is that the left medial aspect of her knee continues to give her problems. Despite completing 5 injections of Orthovisc into both knees on 12/03/2016, the medial aspect of the knee continues to be painful. She discussed with Dr. Estanislado Pandy that she may need further imaging studies and presents today for further evaluation for that.  Also complaining of right hip pain. Patient has trouble sleeping at night. She states that when she lays on her right side it hurts a lot. She also states that when she internally or externally rotates her right hip, she feels pain.  Activities of Daily Living:  Patient reports morning  stiffness for 15 minutes.   Patient Reports nocturnal pain.  Difficulty dressing/grooming: Denies Difficulty climbing stairs: Reports Difficulty getting out of chair: Reports Difficulty using hands for taps, buttons, cutlery, and/or writing: Denies   Review of Systems  Constitutional: Negative for fatigue.  HENT: Negative for mouth sores and mouth dryness.   Eyes: Negative for dryness.  Respiratory: Negative for shortness of breath.   Gastrointestinal: Negative for constipation and diarrhea.  Musculoskeletal: Negative for myalgias and myalgias.  Skin: Negative for sensitivity to sunlight.  Psychiatric/Behavioral: Negative for decreased concentration and sleep disturbance.    PMFS History:  Patient Active Problem List   Diagnosis Date Noted  . Primary osteoarthritis of both hands 07/22/2016  . DDD lumbar spine 07/22/2016  . Trigger finger 07/10/2016  . 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 High risk medication use 06/13/2016  . Primary osteoarthritis of both knees 06/13/2016  . History of gastric bypass 06/13/2016  . Vitamin D deficiency 06/13/2016  . Psoriasis 02/18/2013  . 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 Anxiety   . Arthritis   . GERD (gastroesophageal reflux disease)   . History of kidney stones   . Pelvic mass     Family History  Problem Relation Age of Onset  . Heart disease Father   . Arthritis Father   .  Psoriasis Father   . Parkinsonism Father   . Other Father 70       pacemaker  . Colon cancer Paternal Grandfather   . Heart failure Paternal Grandmother   . Stroke Maternal Grandmother   . Esophageal cancer Neg Hx   . Rectal cancer Neg Hx   . Stomach cancer Neg Hx    Past Surgical History:  Procedure Laterality Date  . ABDOMINAL HYSTERECTOMY    . APPENDECTOMY    . BACK SURGERY    . CHOLECYSTECTOMY    . EXPLORATORY LAPAROTOMY    . GASTRIC BYPASS    . LITHOTRIPSY     Social History    Social History Narrative   Daily caffeine      Objective: Vital Signs: BP 119/75 (BP Location: Left Arm, Patient Position: Sitting, Cuff Size: Normal)   Pulse 74   Ht _0  (1.676 m)   Wt 238 lb (108 kg)   BMI 38.41 kg/m    Physical Exam  Constitutional: She is oriented to person, place, and time. She appears well-developed and well-nourished.  HENT:  Head: Normocephalic and atraumatic.  Eyes: Pupils are equal, round, and reactive to light. EOM are normal.  Cardiovascular: Normal rate, regular rhythm and normal heart sounds.  Exam reveals no gallop and no friction rub.   No murmur heard. Pulmonary/Chest: Effort normal and breath sounds normal. She has no wheezes. She has no rales.  Abdominal: Soft. Bowel sounds are normal. She exhibits no distension. There is no tenderness. There is no guarding. No hernia.  Musculoskeletal: Normal range of motion. She exhibits no edema, tenderness or deformity.  Lymphadenopathy:    She has no cervical adenopathy.  Neurological: She is alert and oriented to person, place, and time. Coordination normal.  Skin: Skin is warm and dry. Capillary refill takes less than 2 seconds. No rash noted.  Psychiatric: She has a normal mood and affect. Her behavior is normal.  Nursing note and vitals reviewed.    Musculoskeletal Exam:  Full range of motion of all joints except has pain to the right hip with internal and external rotation of the right hip Grip strength is equal and strong bilaterally Fibroma also tender points are absent  CDAI Exam: CDAI Homunculus Exam:   Joint Counts:  CDAI Tender Joint count: 0 CDAI Swollen Joint count: 0   No synovitis on exam  Investigation: No additional findings. Office Visit on 09/24/2016  Component Date Value Ref Range Status  . WBC 09/24/2016 6.9  3.8 - 10.8 K/uL Final  . RBC 09/24/2016 4.71  3.80 - 5.10 MIL/uL Final  . Hemoglobin 09/24/2016 12.5  11.7 - 15.5 g/dL Final  . HCT 09/24/2016 37.8  35.0 -  45.0 % Final  . MCV 09/24/2016 80.3  80.0 - 100.0 fL Final  . MCH 09/24/2016 26.5* 27.0 - 33.0 pg Final  . MCHC 09/24/2016 33.1  32.0 - 36.0 g/dL Final  . RDW 09/24/2016 14.6  11.0 - 15.0 % Final  . Platelets 09/24/2016 311  140 - 400 K/uL Final  . MPV 09/24/2016 10.5  7.5 - 12.5 fL Final  . Neutro Abs 09/24/2016 2622  1,500 - 7,800 cells/uL Final  . Lymphs Abs 09/24/2016 3381  850 - 3,900 cells/uL Final  . Monocytes Absolute 09/24/2016 621  200 - 950 cells/uL Final  . Eosinophils Absolute 09/24/2016 207  15 - 500 cells/uL Final  . Basophils Absolute 09/24/2016 69  0 - 200 cells/uL Final  . Neutrophils Relative % 09/24/2016  38  % Final  . Lymphocytes Relative 09/24/2016 49  % Final  . Monocytes Relative 09/24/2016 9  % Final  . Eosinophils Relative 09/24/2016 3  % Final  . Basophils Relative 09/24/2016 1  % Final  . Smear Review 09/24/2016 Criteria for review not met   Final  . Sodium 09/24/2016 142  135 - 146 mmol/L Final  . Potassium 09/24/2016 4.5  3.5 - 5.3 mmol/L Final  . Chloride 09/24/2016 104  98 - 110 mmol/L Final  . CO2 09/24/2016 26  20 - 31 mmol/L Final  . Glucose, Bld 09/24/2016 85  65 - 99 mg/dL Final  . BUN 09/24/2016 13  7 - 25 mg/dL Final  . Creat 09/24/2016 0.63  0.50 - 0.99 mg/dL Final   Comment:   For patients > or = 62 years of age: The upper reference limit for Creatinine is approximately 13% higher for people identified as African-American.     . Total Bilirubin 09/24/2016 0.3  0.2 - 1.2 mg/dL Final  . Alkaline Phosphatase 09/24/2016 84  33 - 130 U/L Final  . AST 09/24/2016 23  10 - 35 U/L Final  . ALT 09/24/2016 24  6 - 29 U/L Final  . Total Protein 09/24/2016 7.4  6.1 - 8.1 g/dL Final  . Albumin 09/24/2016 4.4  3.6 - 5.1 g/dL Final  . Calcium 09/24/2016 9.7  8.6 - 10.4 mg/dL Final  . GFR, Est African American 09/24/2016 >89  >=60 mL/min Final  . GFR, Est Non African American 09/24/2016 >89  >=60 mL/min Final  . Interferon Gamma Release Assay 09/24/2016  NEGATIVE  NEGATIVE Final   Negative test result. M. tuberculosis complex infection unlikely.  . Quantiferon Nil Value 09/24/2016 0.06  IU/mL Final  . Mitogen-Nil 09/24/2016 3.67  IU/mL Final  . Quantiferon Tb Ag Minus Nil Value 09/24/2016 0.03  IU/mL Final   Comment:   The Nil tube value is used to determine if the patient has a preexisting immune response which could cause a false-positive reading on the test. In order for a test to be valid, the Nil tube must have a value of less than or equal to 8.0 IU/mL.   The mitogen control tube is used to assure the patient has a healthy immune status and also serves as a control for correct blood handling and incubation. It is used to detect false-negative readings. The mitogen tube must have a gamma interferon value of greater than or equal to 0.5 IU/mL higher than the value of the Nil tube.   The TB antigen tube is coated with the M. tuberculosis specific antigens. For a test to be considered positive, the TB antigen tube value minus the Nil tube value must be greater than or equal to 0.35 IU/mL.   For additional information, please refer to http://education.quest[employer].com/faq/QFT (This link is being provided for informational/educational purposes only.)   Hospital Outpatient Visit on 07/31/2016  Component Date Value Ref Range Status  . Rest HR 07/31/2016 67  bpm Final  . Rest BP 07/31/2016 152/92  mmHg Final  . Peak HR 07/31/2016 96  bpm Final  . Peak BP 07/31/2016 167/71  mmHg Final  . SSS 07/31/2016 3   Final  . SRS 07/31/2016 0   Final  . SDS 07/31/2016 3   Final  . TID 07/31/2016 1.14   Final  . LV sys vol 07/31/2016 25  mL Final  . LV dias vol 07/31/2016 87  46 - 106 mL Final  Office  Visit on 07/26/2016  Component Date Value Ref Range Status  . TSH 07/26/2016 2.06  mIU/L Final   Comment:   Reference Range   > or = 20 Years  0.40-4.50   Pregnancy Range First trimester  0.26-2.66 Second trimester 0.55-2.73 Third  trimester  0.43-2.91     . Free T4 07/26/2016 1.0  0.8 - 1.8 ng/dL Final     Imaging: No results found.  Speciality Comments: No specialty comments available.    Procedures:  Large Joint Inj Date/Time: 12/12/2016 10:23 AM Performed by: Eliezer Lofts Authorized by: Eliezer Lofts   Consent Given by:  Patient Site marked: the procedure site was marked   Timeout: prior to procedure the correct patient, procedure, and site was verified   Indications:  Pain Location:  Sacroiliac Site:  R sacroiliac joint Prep: patient was prepped and draped in usual sterile fashion   Needle Size:  27 G Needle Length:  1.5 inches Approach:  Superior Ultrasound Guidance: No   Fluoroscopic Guidance: No   Arthrogram: No   Medications:  40 mg triamcinolone acetonide 40 MG/ML; 1.5 mL lidocaine 1 % Aspiration Attempted: Yes   Aspirate amount (mL):  0 Patient tolerance:  Patient tolerated the procedure well with no immediate complications   Allergies: Dilaudid [hydromorphone hcl] and Penicillins   Assessment / Plan:     Visit Diagnoses: 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 Doing well. No joint pain swelling or stiffness. On monotherapy of Enbrel  Twice a week (rx'd by dermatologist, dr Ubaldo Glassing) and having adequate response  #2: Psoriasis. At the last visit, patient had a few psoriasis lesions and she was instructed to use her clobetasol cream for 3-7 days at a time twice a day until lesion resolves. Patient was instructed not to use the cream greater than 7 days during a flare. No flare currently  #3: High risk prescription Enbrel monotherapy (twice a week). Adequate response In the past, patient was using Enbrel and methotrexate but we had to stop the  methotrexate because even low-dose methotrexate made her sick to her stomach.  #4: OA of bilateral knees Completed Orthovisc injections to both knees 4 recently. Her last injection(#5) was 12/03/2016 to both knees  #5: Return to clinic in 5 months.  #6: Left knee pain. Left medial aspect of the knee with pain to palpation without any swelling or redness. Orthovisc has helped the original knee pain but this pain is different and has been going on for a while.  #7: Right hip pain. Pain to right SI joint. Also having pain when she internally rotates or externally rotates her right leg.  #8: X-ray of right hip 2 views==> moderate osteoarthritis of bilateral hips with left worse than right no obvious abnormality.  #9: Schedule MRI of the left knee. Pain to left knee at the medial aspect. No falls or injuries   Orders: Orders Placed This Encounter  Procedures  . Large Joint Injection/Arthrocentesis  . XR HIPS BILAT W OR W/O PELVIS 2V  . CBC with Differential/Platelet  . COMPLETE METABOLIC PANEL WITH GFR   No orders of the defined types were placed in this encounter.   Face-to-face time spent with patient was 30 minutes. Greater than 50% of time was spent in counseling  and coordination of care.  Follow-Up Instructions: Return in about 5 months (around 05/14/2017) for PsA,Ps,Enbrel 2x/wk -[place],left knee pain-mri, rt hip pain,.   Eliezer Lofts, PA-C  Note - This record has been created using [employer].  Chart creation errors have been sought, but may not always  have been located. Such creation errors do not reflect on  the standard of medical care.

## 2016-12-17 ENCOUNTER — Other Ambulatory Visit: Payer: Self-pay | Admitting: Rheumatology

## 2016-12-18 ENCOUNTER — Telehealth: Payer: Self-pay | Admitting: Rheumatology

## 2016-12-18 NOTE — Telephone Encounter (Signed)
Last Visit :12/12/16 Next Visit: 05/22/17  Okay to refill per Dr. Deveshwar 

## 2016-12-18 NOTE — Telephone Encounter (Signed)
Patient called to check the status of MRI. I saw the order had been placed, is it now in their hands at [place] long to schedule?

## 2016-12-27 ENCOUNTER — Ambulatory Visit (HOSPITAL_COMMUNITY): Payer: Managed Care, Other (non HMO)

## 2016-12-28 ENCOUNTER — Ambulatory Visit (HOSPITAL_COMMUNITY)
Admission: RE | Admit: 2016-12-28 | Discharge: 2016-12-28 | Disposition: A | Discharge status: Home / Self Care | Payer: Managed Care, Other (non HMO) | Source: Ambulatory Visit | Attending: Rheumatology | Admitting: Rheumatology

## 2016-12-28 ENCOUNTER — Telehealth (HOSPITAL_COMMUNITY): Payer: Self-pay | Admitting: *Deleted

## 2016-12-28 DIAGNOSIS — M1712 Unilateral primary osteoarthritis, left knee: Secondary | ICD-10-CM | POA: Diagnosis not present

## 2016-12-28 DIAGNOSIS — M7122 Synovial cyst of popliteal space [Baker], left knee: Secondary | ICD-10-CM | POA: Insufficient documentation

## 2016-12-28 DIAGNOSIS — M25562 Pain in left knee: Secondary | ICD-10-CM | POA: Diagnosis not present

## 2016-12-28 DIAGNOSIS — S83242A Other tear of medial meniscus, current injury, left knee, initial encounter: Secondary | ICD-10-CM | POA: Diagnosis not present

## 2016-12-28 DIAGNOSIS — X58XXXA Exposure to other specified factors, initial encounter: Secondary | ICD-10-CM | POA: Insufficient documentation

## 2016-12-28 DIAGNOSIS — M25462 Effusion, left knee: Secondary | ICD-10-CM | POA: Diagnosis not present

## 2016-12-28 DIAGNOSIS — G8929 Other chronic pain: Secondary | ICD-10-CM | POA: Insufficient documentation

## 2016-12-28 NOTE — Telephone Encounter (Signed)
Patient having very difficult time walking due to severe knee pain, MRI performed 12/28/16. Patient is requesting parking placard. Please contact patient when complete. [phone]. Thank you.

## 2016-12-28 NOTE — Progress Notes (Signed)
MRI shows meniscal tear and OA. She needs to get ortho evaluation.

## 2016-12-30 ENCOUNTER — Telehealth: Payer: Self-pay | Admitting: Rheumatology

## 2016-12-30 NOTE — Telephone Encounter (Signed)
Patient had MRI on Saturday. Patient request a call to go over results. Patients return office visit is scheduled for 05/22/17.

## 2016-12-30 NOTE — Telephone Encounter (Signed)
Patient advised of results and transferred to [facility] to schedule and appointment with Dr. Durward Fortes.

## 2017-01-02 ENCOUNTER — Encounter (INDEPENDENT_AMBULATORY_CARE_PROVIDER_SITE_OTHER): Payer: Self-pay | Admitting: Orthopaedic Surgery

## 2017-01-02 ENCOUNTER — Telehealth (INDEPENDENT_AMBULATORY_CARE_PROVIDER_SITE_OTHER): Payer: Self-pay | Admitting: Orthopaedic Surgery

## 2017-01-02 ENCOUNTER — Ambulatory Visit (INDEPENDENT_AMBULATORY_CARE_PROVIDER_SITE_OTHER): Payer: Managed Care, Other (non HMO) | Admitting: Orthopaedic Surgery

## 2017-01-02 VITALS — BP 142/61 | HR 73 | Resp 14 | Ht 64.0 in | Wt 216.0 lb

## 2017-01-02 DIAGNOSIS — M1712 Unilateral primary osteoarthritis, left knee: Secondary | ICD-10-CM | POA: Diagnosis not present

## 2017-01-02 DIAGNOSIS — M23322 Other meniscus derangements, posterior horn of medial meniscus, left knee: Secondary | ICD-10-CM

## 2017-01-02 NOTE — Progress Notes (Signed)
Office Visit Note   Patient: Cassidy Reeves           Date of Birth: 08-Oct-1955           MRN: [id number] Visit Date: 01/02/2017              Requested by: Haywood Pao, MD [address] [place], [place] [address] PCP: Haywood Pao, MD   Assessment & Plan: Visit Diagnoses:  1. Unilateral primary osteoarthritis, left knee   2. Other meniscus derangements, posterior horn of medial meniscus, left knee   MRI scan of left knee performed 12/28/2016 reveals a free edge and inferior articular surface flap tear of the medial meniscus with a small flipped fragment. 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 Cassidy Reeves has had a full complement of conservative treatment with persistent pain. I believe the next best step would be an arthroscopic debridement of the medial meniscus. We had a further discussion about potential benefits and the fact that this may or may not make much of a difference. She's had previous films revealing maintenance of the joint space. To date she's had cortisone and Visco supplementation in her left knee little if any benefit. There is no history of injury or trauma. I did discuss the fact that her arthritis is fairly advanced and that the partial medial meniscectomy may or may not help but I really think it's reasonable approach. She agrees to we'll schedule the surgery  Follow-Up Instructions: Return will schedule surgery.   Orders:  No orders of the defined types were placed in this encounter.  No orders of the defined types were placed in this encounter.     Procedures: No procedures performed   Clinical Data: No additional findings.   Subjective: Chief Complaint  Patient presents with  . Left Knee - Pain    Cassidy Reeves is a pt of Dr. Keturah Barre. MRI showed L kn  tear  Cassidy Reeves has a history of psoriasis with psoriatic arthritis in his mostly being treated through her dermatologist with Enbrel. This seems to make a big difference in many of her joint complaints. She's been followed by Dr. Estanislado Pandy for bilateral knee pain particularly in the left and has had cortisone and Visco supplementation. Because she have persistent complaints an MRI scan of the left knee was recently performed demonstrating a tear of the medial meniscus. She feels that the pain is "different" than the arthritis and that on occasion she does have some problems with her knee feeling like it might be unstable. She also has a history of back problems has had prior back surgery on occasion will have some numbness or tingling into her leg. Her knee at the presently is not swollen and she doesn't use any ambulatory aid. She's here for further evaluation of the MRI scan findings  HPI  Review of Systems  Constitutional: Negative for chills, fatigue and fever.  Eyes: Negative for itching.  Respiratory: Negative for chest tightness and shortness of breath.   Cardiovascular: Positive for leg swelling. Negative for chest pain and palpitations.  Gastrointestinal: Negative for blood in stool, constipation and diarrhea.  Musculoskeletal: Positive for back pain and joint swelling. Negative for neck pain and neck stiffness.  Neurological: Positive for weakness and numbness. Negative for dizziness and headaches.  Hematological: Does not bruise/bleed easily.  Psychiatric/Behavioral:  Negative for sleep disturbance. The patient is not nervous/anxious.      Objective: Vital Signs: BP (!) 142/61   Pulse 73   Resp 14   Ht 5\' 4"  (1.626 m)   Wt 216 lb (98 kg)   BMI 37.08 kg/m   Physical Exam  Ortho Exam left knee without effusion. Minimal patellar crepitation. Diffuse moderate medial joint pain. Full extension and flexion over 105 without instability. No calf pain. No distal edema. Skin intact.  No popliteal mass. Good pulses distally. Pain with range of motion of either hip. Straight leg raise is negative.  Specialty Comments:  No specialty comments available.  Imaging: No results found.   PMFS History: Patient Active Problem List   Diagnosis Date Noted  . Primary osteoarthritis of both hands 07/22/2016  . DDD lumbar spine 07/22/2016  . Trigger finger 07/10/2016  . 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 High risk medication use 06/13/2016  . Primary osteoarthritis of both knees 06/13/2016  . History of gastric bypass 06/13/2016  . Vitamin D deficiency 06/13/2016  . Psoriasis 02/18/2013  . 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 Anxiety   . Arthritis   . GERD (gastroesophageal reflux disease)   . History of kidney stones   . Pelvic mass     Family History  Problem Relation Age of Onset  . Heart disease Father   . Arthritis Father   . Psoriasis Father   . Parkinsonism Father   . Other Father 66       pacemaker  . Colon cancer Paternal Grandfather   . Heart failure Paternal Grandmother   . Stroke Maternal Grandmother   . Esophageal cancer Neg Hx   . Rectal cancer Neg Hx   . Stomach cancer Neg Hx     Past Surgical History:  Procedure Laterality Date  . ABDOMINAL HYSTERECTOMY    . APPENDECTOMY    . BACK SURGERY    . CHOLECYSTECTOMY    . EXPLORATORY LAPAROTOMY    . GASTRIC BYPASS    . LITHOTRIPSY     Social History   Occupational History  .  [employer]   Social History Main Topics  . Smoking status: Never Smoker  . Smokeless tobacco: Never Used  . Alcohol use Yes     Comment: occasio  . Drug use: No  . Sexual activity: Not on file

## 2017-01-02 NOTE — Telephone Encounter (Signed)
LVM with pt to please call to schedule surgery. Will try pt again at a later time. 

## 2017-01-07 NOTE — Telephone Encounter (Signed)
Left message to advise patient parking placard form was ready for pick up.

## 2017-01-13 ENCOUNTER — Telehealth (INDEPENDENT_AMBULATORY_CARE_PROVIDER_SITE_OTHER): Payer: Self-pay | Admitting: Orthopaedic Surgery

## 2017-01-13 NOTE — Telephone Encounter (Signed)
See new codes

## 2017-01-13 NOTE — Telephone Encounter (Signed)
Code [id number], possible code [phone]

## 2017-01-13 NOTE — Telephone Encounter (Signed)
[employer] calling in reference to approval for patient, requesting a CPT code other than 298.77. Denial based on code. Please call if submitting new code for approval. Claim # [id number] thru [facility]

## 2017-01-13 NOTE — Telephone Encounter (Signed)
Please advise 

## 2017-01-16 ENCOUNTER — Encounter: Payer: Self-pay | Admitting: Orthopaedic Surgery

## 2017-01-16 DIAGNOSIS — S83242D Other tear of medial meniscus, current injury, left knee, subsequent encounter: Secondary | ICD-10-CM | POA: Diagnosis not present

## 2017-01-17 ENCOUNTER — Telehealth (INDEPENDENT_AMBULATORY_CARE_PROVIDER_SITE_OTHER): Payer: Self-pay | Admitting: Orthopaedic Surgery

## 2017-01-17 NOTE — Telephone Encounter (Signed)
Patient has a question about pain medicine. Patient is itching a little, and would like to know if she can take some benadryl with medication. Please call to inform.

## 2017-01-17 NOTE — Telephone Encounter (Signed)
Patient had surgery with Dr. Durward Fortes. Patient has already spoke with Dr. Durward Fortes and was given recommendations.

## 2017-01-22 ENCOUNTER — Encounter (INDEPENDENT_AMBULATORY_CARE_PROVIDER_SITE_OTHER): Payer: Self-pay | Admitting: Orthopedic Surgery

## 2017-01-22 ENCOUNTER — Ambulatory Visit (INDEPENDENT_AMBULATORY_CARE_PROVIDER_SITE_OTHER): Payer: Managed Care, Other (non HMO) | Admitting: Orthopedic Surgery

## 2017-01-22 VITALS — BP 150/69 | HR 77 | Resp 15 | Ht 66.0 in | Wt 239.0 lb

## 2017-01-22 DIAGNOSIS — Z9889 Other specified postprocedural states: Secondary | ICD-10-CM

## 2017-01-22 DIAGNOSIS — M1712 Unilateral primary osteoarthritis, left knee: Secondary | ICD-10-CM

## 2017-01-22 MED ORDER — TRAMADOL HCL 50 MG PO TABS: 50.0000 mg | ORAL_TABLET | Freq: Four times a day (QID) | ORAL | 0 refills | Status: DC | PRN

## 2017-01-22 NOTE — Progress Notes (Signed)
Office Visit Note   Patient: Cassidy Reeves           Date of Birth: 1956-03-12           MRN: [id number] Visit Date: 01/22/2017              Requested by: Haywood Pao, MD [address] [place], [place] [address] PCP: Haywood Pao, MD   Assessment & Plan: Visit Diagnoses:  1. S/P medial meniscectomy of left knee   2. S/P arthroscopy     Plan:  #1: She can wean off of her walker as tolerated #2: Given a prescription for tramadol when necessary pain #3: Out of work for 3 weeks from surgery and note was given   Follow-Up Instructions: Return in about 12 days (around 02/03/2017).   Orders:  No orders of the defined types were placed in this encounter.  Meds ordered this encounter  Medications  . traMADol (ULTRAM) 50 MG tablet    Sig: Take 1 tablet (50 mg total) by mouth every 6 (six) hours as needed.    Dispense:  20 tablet    Refill:  0    Order Specific Question:   Supervising Provider    Answer:   Garald Balding [[id number]]      Procedures: No procedures performed   Clinical Data: No additional findings.   Subjective: Chief Complaint  Patient presents with  . Left Knee - Routine Post Op  . Routine Post Op    Left muscle strain, left knee arthroscopy, difficulty walking and sleeping, swelling, Tylenol and IBU helps    HPI  Cassidy Reeves is a 61 year old white female who is seen now 6 days status post arthroscopic debridement of her left knee with partial medial meniscectomy and chondroplasty. She is doing well. She is ambulating with her walker without difficulty. Denies any calf pain.  Review of Systems  Constitutional: Negative.   HENT: Negative.   Respiratory: Negative.   Cardiovascular: Negative.   Gastrointestinal: Negative.   Genitourinary: Negative.   Skin: Negative.   Neurological: Negative.   Hematological: Negative.   Psychiatric/Behavioral: Negative.      Objective: Vital Signs: BP (!) 150/69 (BP Location: Right Arm, Patient  Position: Sitting, Cuff Size: Normal)   Pulse 77   Resp 15   Ht 5\' 6"  (1.676 m)   Wt 239 lb (108.4 kg)   BMI 38.58 kg/m   Physical Exam  Constitutional: She is oriented to person, place, and time. She appears well-developed and well-nourished.  HENT:  Head: Normocephalic and atraumatic.  Eyes: Pupils are equal, round, and reactive to light. EOM are normal.  Pulmonary/Chest: Effort normal.  Neurological: She is alert and oriented to person, place, and time.  Skin: Skin is warm and dry.  Psychiatric: She has a normal mood and affect. Her behavior is normal. Judgment and thought content normal.    Ortho Exam  Today her wounds are healing per primam no signs of infection. No warmth or erythema. Range of motion from near full extension to about 100- 105. Calf is supple and nontender. She does have a mild effusion.   Specialty Comments:  No specialty comments available.  Imaging: No results found.   PMFS History: Patient Active Problem List   Diagnosis Date Noted  . Primary osteoarthritis of both hands 07/22/2016  . DDD lumbar spine 07/22/2016  . Trigger finger 07/10/2016  . 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 High risk medication use 06/13/2016  . Primary osteoarthritis of both  knees 06/13/2016  . History of gastric bypass 06/13/2016  . Vitamin D deficiency 06/13/2016  . Psoriasis 02/18/2013  . 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 Anxiety   . Arthritis   . GERD (gastroesophageal reflux disease)   . History of kidney stones   . Pelvic mass     Family History  Problem Relation Age of Onset  . Heart disease Father   . Arthritis Father   . Psoriasis Father   . Parkinsonism Father   . Other Father 19       pacemaker  . Colon cancer Paternal Grandfather   . Heart failure Paternal Grandmother   . Stroke Maternal Grandmother   . Esophageal cancer Neg Hx   . Rectal cancer Neg Hx   . Stomach cancer Neg Hx       Past Surgical History:  Procedure Laterality Date  . ABDOMINAL HYSTERECTOMY    . APPENDECTOMY    . BACK SURGERY    . CHOLECYSTECTOMY    . EXPLORATORY LAPAROTOMY    . GASTRIC BYPASS    . KNEE ARTHROSCOPY    . LITHOTRIPSY     Social History   Occupational History  .  [employer]   Social History Main Topics  . Smoking status: Never Smoker  . Smokeless tobacco: Never Used  . Alcohol use Yes     Comment: occasionally  . Drug use: No  . Sexual activity: Not on file

## 2017-01-30 ENCOUNTER — Encounter (INDEPENDENT_AMBULATORY_CARE_PROVIDER_SITE_OTHER): Payer: Self-pay

## 2017-01-30 ENCOUNTER — Telehealth (INDEPENDENT_AMBULATORY_CARE_PROVIDER_SITE_OTHER): Payer: Self-pay | Admitting: Orthopaedic Surgery

## 2017-01-30 NOTE — Telephone Encounter (Signed)
Called pt to pick up at [facility]

## 2017-01-30 NOTE — Telephone Encounter (Signed)
Ok to do

## 2017-01-30 NOTE — Telephone Encounter (Signed)
Patient calling to get a return to work note ASAP to be released to work from home. Patient would like it back dated to start 01/27/17, if possible. Please call patient to advise. Patient was requesting Ivin Booty to help with this since she took care of last work note for patient.

## 2017-01-30 NOTE — Telephone Encounter (Signed)
OK to do-

## 2017-02-03 ENCOUNTER — Encounter (INDEPENDENT_AMBULATORY_CARE_PROVIDER_SITE_OTHER): Payer: Self-pay | Admitting: Orthopaedic Surgery

## 2017-02-03 ENCOUNTER — Ambulatory Visit (INDEPENDENT_AMBULATORY_CARE_PROVIDER_SITE_OTHER): Payer: Managed Care, Other (non HMO) | Admitting: Orthopaedic Surgery

## 2017-02-03 VITALS — BP 130/62 | HR 75 | Resp 12 | Ht 66.0 in | Wt 239.0 lb

## 2017-02-03 DIAGNOSIS — G8929 Other chronic pain: Secondary | ICD-10-CM

## 2017-02-03 DIAGNOSIS — M25562 Pain in left knee: Secondary | ICD-10-CM

## 2017-02-03 NOTE — Progress Notes (Signed)
Office Visit Note   Patient: Cassidy Reeves           Date of Birth: Jun 14, 1955           MRN: [id number] Visit Date: 02/03/2017              Requested by: Haywood Pao, MD [address] [place], [place] [address] PCP: Haywood Pao, MD   Assessment & Plan: Visit Diagnoses:  1. Chronic pain of left knee   2-1/2 weeks status post left knee arthroscopy with tear of the medial meniscus and some degenerative changes area and doing relatively well using a cane  Plan: Instructed in exercises. Wean from cane. Advil as needed. Office 1 month .continue with light duty activity  Follow-Up Instructions: Return in about 1 month (around 03/05/2017).   Orders:  No orders of the defined types were placed in this encounter.  No orders of the defined types were placed in this encounter.     Procedures: No procedures performed   Clinical Data: No additional findings.   Subjective: Chief Complaint  Patient presents with  . Left Knee - Routine Post Op    Cassidy Reeves  Is a 61 y o status post  2.5weeks Left knee arthroscopy.. Uses cane for safety.  No history of fever or chills. No related calf pain or distal edema. Still has a limp and uses a cane but certainly better than the first week postop  HPI  Review of Systems  Constitutional: Negative for chills, fatigue and fever.  Eyes: Negative for itching.  Respiratory: Negative for chest tightness and shortness of breath.   Cardiovascular: Negative for chest pain, palpitations and leg swelling.  Gastrointestinal: Negative for blood in stool, constipation and diarrhea.  Endocrine: Negative for polyuria.  Genitourinary: Negative for dysuria.  Musculoskeletal: Positive for back pain. Negative for joint swelling, neck pain and neck stiffness.  Allergic/Immunologic: Negative for immunocompromised state.  Neurological: Negative for dizziness and numbness.  Hematological: Does not bruise/bleed easily.  Psychiatric/Behavioral:  Positive for sleep disturbance. The patient is not nervous/anxious.      Objective: Vital Signs: BP 130/62   Pulse 75   Resp 12   Ht 5\' 6"  (1.676 m)   Wt 239 lb (108.4 kg)   BMI 38.58 kg/m   Physical Exam  Ortho Exam left knee arthroscopic portals healing without evidence of infection. No effusion. No popliteal pain. No calf pain or distal edema. Neurovascular exam intact. Full extension over 105 of flexion without instability. Still some medial joint pain.   No specialty comments available.  Imaging: No results found.   PMFS History: Patient Active Problem List   Diagnosis Date Noted  . Primary osteoarthritis of both hands 07/22/2016  . DDD lumbar spine 07/22/2016  . Trigger finger 07/10/2016  . 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 High risk medication use 06/13/2016  . Primary osteoarthritis of both knees 06/13/2016  . History of gastric bypass 06/13/2016  . Vitamin D deficiency 06/13/2016  . Psoriasis 02/18/2013  . 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 Anxiety   . Arthritis   . GERD (gastroesophageal reflux disease)   . History of kidney stones   . Pelvic mass     Family History  Problem Relation Age of Onset  . Heart disease Father   . Arthritis Father   . Psoriasis Father   . Parkinsonism Father   . Other Father 67  pacemaker  . Colon cancer Paternal Grandfather   . Heart failure Paternal Grandmother   . Stroke Maternal Grandmother   . Esophageal cancer Neg Hx   . Rectal cancer Neg Hx   . Stomach cancer Neg Hx     Past Surgical History:  Procedure Laterality Date  . ABDOMINAL HYSTERECTOMY    . APPENDECTOMY    . BACK SURGERY    . CHOLECYSTECTOMY    . EXPLORATORY LAPAROTOMY    . GASTRIC BYPASS    . KNEE ARTHROSCOPY    . LITHOTRIPSY     Social History   Occupational History  .  [employer]   Social History Main Topics  . Smoking status: Never Smoker  . Smokeless tobacco:  Never Used  . Alcohol use Yes     Comment: occasionally  . Drug use: No  . Sexual activity: Not on file

## 2017-02-24 ENCOUNTER — Ambulatory Visit: Payer: Managed Care, Other (non HMO) | Admitting: Rheumatology

## 2017-03-07 ENCOUNTER — Ambulatory Visit (INDEPENDENT_AMBULATORY_CARE_PROVIDER_SITE_OTHER): Payer: Managed Care, Other (non HMO) | Admitting: Orthopaedic Surgery

## 2017-04-23 ENCOUNTER — Telehealth: Payer: Self-pay | Admitting: Rheumatology

## 2017-04-23 NOTE — Telephone Encounter (Signed)
Patient contact the office stating she needs a cortisone injection in her left knee. Patient states she has had surgery with Dr. Garnette Czech on this knee and has a follow up on next Friday but has a big trip coming up for work next week and would like to have the injection before going. Patient has been scheduled for 04/25/17 at 11:30 am.

## 2017-04-23 NOTE — Telephone Encounter (Signed)
Patient calling in reference to a cortisone shot. She has a question. Please call to advise.

## 2017-04-25 ENCOUNTER — Ambulatory Visit (INDEPENDENT_AMBULATORY_CARE_PROVIDER_SITE_OTHER): Payer: Managed Care, Other (non HMO) | Admitting: Orthopaedic Surgery

## 2017-04-25 ENCOUNTER — Encounter (INDEPENDENT_AMBULATORY_CARE_PROVIDER_SITE_OTHER): Payer: Self-pay | Admitting: Orthopaedic Surgery

## 2017-04-25 VITALS — BP 135/75 | HR 75 | Resp 16 | Ht 66.0 in | Wt 239.0 lb

## 2017-04-25 DIAGNOSIS — M170 Bilateral primary osteoarthritis of knee: Secondary | ICD-10-CM

## 2017-04-25 MED ORDER — LIDOCAINE HCL 1 % IJ SOLN
2.0000 mL | INTRAMUSCULAR | Status: AC | PRN
  Administered 2017-04-25: 2 mL

## 2017-04-25 MED ORDER — BUPIVACAINE HCL 0.5 % IJ SOLN
2.0000 mL | INTRAMUSCULAR | Status: AC | PRN
  Administered 2017-04-25: 2 mL via INTRA_ARTICULAR

## 2017-04-25 MED ORDER — METHYLPREDNISOLONE ACETATE 40 MG/ML IJ SUSP
80.0000 mg | INTRAMUSCULAR | Status: AC | PRN
  Administered 2017-04-25: 80 mg

## 2017-04-25 NOTE — Progress Notes (Signed)
Office Visit Note   Patient: Cassidy Reeves           Date of Birth: 1955/08/26           MRN: [id number] Visit Date: 04/25/2017              Requested by: Haywood Pao, MD [address] [place], [place] [address] PCP: Haywood Pao, MD   Assessment & Plan: Visit Diagnoses:  1. Primary osteoarthritis of both knees     Plan: Cassidy Reeves is status post left knee arthroscopy. Genic combination of tricompartmental degenerative arthritis as well as a tear of the medial meniscus. She, Pletal and of an improvement in her knee pain but still has the arthritic discomfort. She is also being followed by Dr. Estanislado Pandy and is completed a course of physical supplementation in July. She's had some exacerbation of her left knee pain with the weather and wanted a cortisone injection. This was performed without difficulty. We'll plan to see her back  Follow-Up Instructions: Return if symptoms worsen or fail to improve.   Orders:  Orders Placed This Encounter  Procedures  . Large Joint Inj: L knee   No orders of the defined types were placed in this encounter.     Procedures: Large Joint Inj: L knee on 04/25/2017 12:48 PM Indications: pain and diagnostic evaluation Details: 25 G 1.5 in needle, anteromedial approach  Arthrogram: No  Medications: 2 mL lidocaine 1 %; 2 mL bupivacaine 0.5 %; 80 mg methylPREDNISolone acetate 40 MG/ML Procedure, treatment alternatives, risks and benefits explained, specific risks discussed. Consent was given by the patient. Patient was prepped and draped in the usual sterile fashion.       Clinical Data: No additional findings.   Subjective: Chief Complaint  Patient presents with  . Left Knee - Pain, Edema    Cassidy Reeves is a 61 y o S/P 3 months Left knee arthroscopy. She relates she is in pain since the surgery and hurts doing daily activities.  Finish her course of Visco supplementation with Dr. Estanislado Pandy about 5 months ago. Has had some recent  exacerbation of medial left knee pain potentially related to the weather. She was noted to have some arthritic changes per her prior arthroscopy  HPI  Review of Systems  Constitutional: Positive for fatigue. Negative for chills and fever.  Eyes: Negative for itching.  Respiratory: Negative for chest tightness and shortness of breath.   Cardiovascular: Negative for chest pain, palpitations and leg swelling.  Gastrointestinal: Negative for blood in stool, constipation and diarrhea.  Endocrine: Negative for polyuria.  Genitourinary: Negative for dysuria.  Musculoskeletal: Positive for joint swelling. Negative for back pain, neck pain and neck stiffness.  Allergic/Immunologic: Positive for immunocompromised state.  Neurological: Negative for dizziness and numbness.  Hematological: Does not bruise/bleed easily.  Psychiatric/Behavioral: Positive for sleep disturbance. The patient is not nervous/anxious.      Objective: Vital Signs: BP 135/75   Pulse 75   Resp 16   Ht 5\' 6"  (1.676 m)   Wt 239 lb (108.4 kg)   BMI 38.58 kg/m   Physical Exam  Ortho Exam left knee without obvious effusion but does have large knees. Predominantly medial joint pain. Full extension about 100 of flexion. No instability. Minimal patellar crepitation. No lateral joint pain. No popliteal discomfort. No calf pain. Neurovascular exam intact distally  No specialty comments available.  Imaging: No results found.   PMFS History: Patient Active Problem List   Diagnosis Date Noted  .  Primary osteoarthritis of both hands 07/22/2016  . DDD lumbar spine 07/22/2016  . Trigger finger 07/10/2016  . 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 High risk medication use 06/13/2016  . Primary osteoarthritis of both knees 06/13/2016  . History of gastric bypass 06/13/2016  . Vitamin D deficiency 06/13/2016  . Psoriasis 02/18/2013  . 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  Anxiety   . Arthritis   . GERD (gastroesophageal reflux disease)   . History of kidney stones   . Pelvic mass     Family History  Problem Relation Age of Onset  . Heart disease Father   . Arthritis Father   . Psoriasis Father   . Parkinsonism Father   . Other Father 90       pacemaker  . Colon cancer Paternal Grandfather   . Heart failure Paternal Grandmother   . Stroke Maternal Grandmother   . Esophageal cancer Neg Hx   . Rectal cancer Neg Hx   . Stomach cancer Neg Hx     Past Surgical History:  Procedure Laterality Date  . ABDOMINAL HYSTERECTOMY    . APPENDECTOMY    . BACK SURGERY    . CHOLECYSTECTOMY    . EXPLORATORY LAPAROTOMY    . GASTRIC BYPASS    . KNEE ARTHROSCOPY    . LITHOTRIPSY     Social History   Occupational History    Employer: [employer]  Tobacco Use  . Smoking status: Never Smoker  . Smokeless tobacco: Never Used  Substance and Sexual Activity  . Alcohol use: Yes    Comment: occasionally  . Drug use: No  . Sexual activity: Not on file

## 2017-05-02 ENCOUNTER — Ambulatory Visit (INDEPENDENT_AMBULATORY_CARE_PROVIDER_SITE_OTHER): Payer: Managed Care, Other (non HMO) | Admitting: Orthopaedic Surgery

## 2017-05-08 NOTE — Progress Notes (Signed)
Office Visit Note  Patient: Cassidy Reeves             Date of Birth: [birth year]           MRN: [id number]             PCP: Haywood Pao, MD Referring: Haywood Pao, MD Visit Date: 05/22/2017 Occupation: @GUAROCC @    Subjective:  Other (not doing good, increased fatigue )   History of Present Illness: Cassidy Reeves is a 61 y.o. female with history of psoriatic arthritis, psoriasis and osteoarthritis. She underwent left knee joint arthroscopic surgery in August 2018 by Dr. Durward Fortes for meniscal tear repair. She states she continues to have discomfort in her left knee coming from osteoarthritis. She's been having increase arthralgias but no joint swelling. She's also experiencing increased fatigue. She continues to have lower back pain and the gluteal region. She notices decreased grip strength in her bilateral hands. Her psoriasis has flared in the last 2 months. There is no history of Achilles tendinitis, plantar fasciitis or iritis.  Activities of Daily Living:  Patient reports morning stiffness for 2 hours.   Patient Reports nocturnal pain.  Difficulty dressing/grooming: Denies Difficulty climbing stairs: Reports Difficulty getting out of chair: Reports Difficulty using hands for taps, buttons, cutlery, and/or writing: Denies   Review of Systems  Constitutional: Positive for fatigue. Negative for night sweats, weight gain, weight loss and weakness.  HENT: Negative for mouth sores, trouble swallowing, trouble swallowing, mouth dryness and nose dryness.   Eyes: Negative for pain, redness, visual disturbance and dryness.  Respiratory: Negative for cough, shortness of breath and difficulty breathing.   Cardiovascular: Negative for chest pain, palpitations, hypertension, irregular heartbeat and swelling in legs/feet.  Gastrointestinal: Negative for blood in stool, constipation and diarrhea.  Endocrine: Negative for increased urination.  Genitourinary: Negative for vaginal  dryness.  Musculoskeletal: Positive for arthralgias, joint pain, myalgias, morning stiffness and myalgias. Negative for joint swelling, muscle weakness and muscle tenderness.  Skin: Positive for rash. Negative for color change, hair loss, skin tightness, ulcers and sensitivity to sunlight.       psoriasis  Allergic/Immunologic: Negative for susceptible to infections.  Neurological: Negative for dizziness, memory loss and night sweats.  Hematological: Negative for swollen glands.  Psychiatric/Behavioral: Positive for depressed mood and sleep disturbance. The patient is not nervous/anxious.        Nocturnal pain    PMFS History:  Patient Active Problem List   Diagnosis Date Noted  . History of depression 05/22/2017  . Primary osteoarthritis of both hands 07/22/2016  . DDD lumbar spine 07/22/2016  . Trigger finger 07/10/2016  . Psoriatic arthropathy ([place]) 07/10/2016  . High risk medication use 06/13/2016  . Primary osteoarthritis of both knees 06/13/2016  . History of gastric bypass 06/13/2016  . Vitamin D deficiency 06/13/2016  . Psoriasis 02/18/2013  . 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 Anxiety   . Arthritis   . GERD (gastroesophageal reflux disease)   . History of kidney stones   . Pelvic mass     Family History  Problem Relation Age of Onset  . Heart disease Father   . Arthritis Father   . Psoriasis Father   . Parkinsonism Father   . Other Father 5       pacemaker  . Colon cancer Paternal Grandfather   . Heart failure Paternal Grandmother   . Stroke Maternal Grandmother   .  Esophageal cancer Neg Hx   . Rectal cancer Neg Hx   . Stomach cancer Neg Hx    Past Surgical History:  Procedure Laterality Date  . ABDOMINAL HYSTERECTOMY    . APPENDECTOMY    . BACK SURGERY    . CHOLECYSTECTOMY    . EXPLORATORY LAPAROTOMY    . GASTRIC BYPASS    . KNEE ARTHROSCOPY    . LITHOTRIPSY     Social History   Social History  Narrative   Daily caffeine      Objective: Vital Signs: BP 126/82 (BP Location: Left Arm, Patient Position: Sitting, Cuff Size: Normal)   Pulse 72   Resp 16   Ht 5\' 6"  (1.676 m)   Wt 251 lb (113.9 kg)   BMI 40.51 kg/m    Physical Exam  Constitutional: She is oriented to person, place, and time. She appears well-developed and well-nourished.  HENT:  Head: Normocephalic and atraumatic.  Eyes: Conjunctivae and EOM are normal.  Neck: Normal range of motion.  Cardiovascular: Normal rate, regular rhythm, normal heart sounds and intact distal pulses.  Pulmonary/Chest: Effort normal and breath sounds normal.  Abdominal: Soft. Bowel sounds are normal.  Lymphadenopathy:    She has no cervical adenopathy.  Neurological: She is alert and oriented to person, place, and time.  Skin: Skin is warm and dry. Capillary refill takes less than 2 seconds. Rash noted.  Psoriasis patches on bilateral elbows, bilateral lower extremities on her shins and gluteal region  Psychiatric: She has a normal mood and affect. Her behavior is normal.  Nursing note and vitals reviewed.    Musculoskeletal Exam: C-spine and thoracic spine good range of motion. She has limited range of motion of the lumbar spine with discomfort. She had no SI joint tender mass. She had tenderness over gluteal area bilaterally. Shoulder joints, elbow joints, wrist joints, MCPs, PIPs, DIPs were all good range of motion with no synovitis. Hip joints are good range of motion. She is warmth and swelling in her left knee joint without effusion. Ankles joints MTPs PIPs with good range of motion with no synovitis.  CDAI Exam: CDAI Homunculus Exam:   Tenderness:  LLE: tibiofemoral  Swelling:  LLE: tibiofemoral  Joint Counts:  CDAI Tender Joint count: 1 CDAI Swollen Joint count: 1  Global Assessments:  Patient Global Assessment: 6 Provider Global Assessment: 4  CDAI Calculated Score: 12    Investigation: Findings:  05/08/2017  CBC normal, CMP normal, UA trace esterase and few bacteria., LDL 122 TB Gold: 09/24/2016 Negative  CBC Latest Ref Rng & Units 12/12/2016 09/24/2016 05/15/2016  WBC 3.8 - 10.8 K/uL 6.4 6.9 6.5  Hemoglobin 11.7 - 15.5 g/dL 12.3 12.5 12.1  Hematocrit 35.0 - 45.0 % 37.8 37.8 37.8  Platelets 140 - 400 K/uL 336 311 313   CMP Latest Ref Rng & Units 12/12/2016 09/24/2016 05/15/2016  Glucose 65 - 99 mg/dL 85 85 93  BUN 7 - 25 mg/dL 14 13 13   Creatinine 0.50 - 0.99 mg/dL 0.66 0.63 0.72  Sodium 135 - 146 mmol/L 140 142 140  Potassium 3.5 - 5.3 mmol/L 4.8 4.5 5.0  Chloride 98 - 110 mmol/L 103 104 104  CO2 20 - 31 mmol/L 26 26 28   Calcium 8.6 - 10.4 mg/dL 9.1 9.7 9.5  Total Protein 6.1 - 8.1 g/dL 7.1 7.4 7.0  Total Bilirubin 0.2 - 1.2 mg/dL 0.3 0.3 0.2  Alkaline Phos 33 - 130 U/L 88 84 97  AST 10 - 35 U/L 22  23 16  ALT 6 - 29 U/L 21 24 16     Imaging: No results found.  Speciality Comments: No specialty comments available.    Procedures:  No procedures performed Allergies: Dilaudid [hydromorphone hcl] and Penicillins   Assessment / Plan:     Visit Diagnoses: Psoriatic arthropathy ([place]) patient complains of increase arthralgias but she had no synovitis on examination.  Psoriasis: She has mild flare of psoriasis over her elbows and on her lower extremities. She states she usually has a flare during the wintertime.  High risk medication use - Enbrel every week by Dr. Ubaldo Glassing, clobetasol cream - Plan: QuantiFERON-TB Gold Plus  Other fatigue . She's been experiencing increased fatigue. I'll obtain following labs.- Plan: CK, TSH, Vitamin B12, VITAMIN D 25 Hydroxy (Vit-D Deficiency, Fractures), Sedimentation rate, Serum protein electrophoresis with reflex  Generalized pain: She continues to have some generalized pain and myalgias. My concern is that she may be developing myofascial pain syndrome. I will refer her to physical therapy.  Primary osteoarthritis of both knees -  status post left knee joint  arthroscopic surgery for meniscal tear repair August 2018 by Dr. Durward Fortes. She continues to have some warmth and swelling in her left knee joint and difficulty walking. She states she had cortisone injection about 3 weeks ago without results. She good response to Visco supplement injections in the past. We will schedule her for repeat. Visco supplement injections.  Primary osteoarthritis of both hands: Joint protection and muscle strengthening was discussed.  DDD lumbar spine - On Robaxin when necessary  History of vitamin D deficiency: She's been taking vitamin D supplement.  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 She is on Ambien daily at bedtime and Xanax when necessary.    Orders: Orders Placed This Encounter  Procedures  . QuantiFERON-TB Gold Plus  . CK  . TSH  . Vitamin B12  . VITAMIN D 25 Hydroxy (Vit-D Deficiency, Fractures)  . Sedimentation rate  . Serum protein electrophoresis with reflex   No orders of the defined types were placed in this encounter.   Face-to-face time spent with patient was 30 minutes. Greater than 50% of time was spent in counseling and coordination of care.  Follow-Up Instructions: Return in about 4 months (around 09/19/2017) for Psoriatic arthritis, Osteoarthritis.   Bo Merino, MD  Note - This record has been created using Editor, commissioning.  Chart creation errors have been sought, but may not always  have been located. Such creation errors do not reflect on  the standard of medical care.

## 2017-05-22 ENCOUNTER — Ambulatory Visit: Payer: Managed Care, Other (non HMO) | Admitting: Rheumatology

## 2017-05-22 ENCOUNTER — Encounter: Payer: Self-pay | Admitting: Rheumatology

## 2017-05-22 VITALS — BP 126/82 | HR 72 | Resp 16 | Ht 66.0 in | Wt 251.0 lb

## 2017-05-22 DIAGNOSIS — M170 Bilateral primary osteoarthritis of knee: Secondary | ICD-10-CM | POA: Diagnosis not present

## 2017-05-22 DIAGNOSIS — Z9884 Bariatric surgery status: Secondary | ICD-10-CM | POA: Diagnosis not present

## 2017-05-22 DIAGNOSIS — M47816 Spondylosis without myelopathy or radiculopathy, lumbar region: Secondary | ICD-10-CM | POA: Diagnosis not present

## 2017-05-22 DIAGNOSIS — G4701 Insomnia due to medical condition: Secondary | ICD-10-CM

## 2017-05-22 DIAGNOSIS — M19041 Primary osteoarthritis, right hand: Secondary | ICD-10-CM

## 2017-05-22 DIAGNOSIS — R5383 Other fatigue: Secondary | ICD-10-CM

## 2017-05-22 DIAGNOSIS — Z8659 Personal history of other mental and behavioral disorders: Secondary | ICD-10-CM

## 2017-05-22 DIAGNOSIS — L4050 Arthropathic psoriasis, unspecified: Secondary | ICD-10-CM | POA: Diagnosis not present

## 2017-05-22 DIAGNOSIS — Z79899 Other long term (current) drug therapy: Secondary | ICD-10-CM

## 2017-05-22 DIAGNOSIS — L409 Psoriasis, unspecified: Secondary | ICD-10-CM | POA: Diagnosis not present

## 2017-05-22 DIAGNOSIS — M19042 Primary osteoarthritis, left hand: Secondary | ICD-10-CM | POA: Diagnosis not present

## 2017-05-22 DIAGNOSIS — Z8639 Personal history of other endocrine, nutritional and metabolic disease: Secondary | ICD-10-CM

## 2017-05-22 NOTE — Patient Instructions (Addendum)
Standing Labs We placed an order today for your standing lab work.    Please come back and get your standing labs in March and every 3 months  Please TB gold in June  We have open lab Monday through Friday from 8:30-11:30 AM and 1:30-4 PM at the office of Dr. Bo Merino.   The office is located at [address], [place], [place], [place] [address] No appointment is necessary.   Labs are drawn by [facility].  You may receive a bill from [facility] for your lab work. If you have any questions regarding directions or hours of operation,  please call [phone].

## 2017-05-23 NOTE — Progress Notes (Signed)
The skull vitamin D 50,000 units once a week for 3 months after that she will require maintenance therapy. Recheck level in 3 months.

## 2017-05-26 ENCOUNTER — Telehealth: Payer: Self-pay | Admitting: *Deleted

## 2017-05-26 DIAGNOSIS — E559 Vitamin D deficiency, unspecified: Secondary | ICD-10-CM

## 2017-05-26 LAB — PROTEIN ELECTROPHORESIS, SERUM, WITH REFLEX
ALBUMIN ELP: 4.1 g/dL (ref 3.8–4.8)
ALPHA 1: 0.3 g/dL (ref 0.2–0.3)
ALPHA 2: 0.8 g/dL (ref 0.5–0.9)
BETA 2: 0.4 g/dL (ref 0.2–0.5)
BETA GLOBULIN: 0.5 g/dL (ref 0.4–0.6)
Gamma Globulin: 1.1 g/dL (ref 0.8–1.7)
TOTAL PROTEIN: 7.1 g/dL (ref 6.1–8.1)

## 2017-05-26 LAB — VITAMIN D 25 HYDROXY (VIT D DEFICIENCY, FRACTURES): VIT D 25 HYDROXY: 27 ng/mL — AB (ref 30–100)

## 2017-05-26 LAB — TSH: TSH: 2.47 m[IU]/L (ref 0.40–4.50)

## 2017-05-26 LAB — VITAMIN B12: Vitamin B-12: 223 pg/mL (ref 200–1100)

## 2017-05-26 LAB — SEDIMENTATION RATE: Sed Rate: 6 mm/h (ref 0–30)

## 2017-05-26 LAB — CK: CK TOTAL: 71 U/L (ref 29–143)

## 2017-05-26 MED ORDER — VITAMIN D (ERGOCALCIFEROL) 1.25 MG (50000 UNIT) PO CAPS: 50000.0000 [IU] | ORAL_CAPSULE | ORAL | 0 refills | Status: DC

## 2017-05-26 NOTE — Telephone Encounter (Signed)
-----   Message from Bo Merino, MD sent at 05/23/2017  1:51 PM EST ----- The skull vitamin D 50,000 units once a week for 3 months after that she will require maintenance therapy. Recheck level in 3 months.

## 2017-07-09 ENCOUNTER — Telehealth: Payer: Self-pay | Admitting: Rheumatology

## 2017-07-09 NOTE — Telephone Encounter (Signed)
Patient calling to see if Dr. ordered the Gel knee injections that they discussed at patients last appt. Please call to advise the status of that.

## 2017-07-18 NOTE — Progress Notes (Signed)
Office Visit Note  Patient: Cassidy Reeves             Date of Birth: [birth year]           MRN: [id number]             PCP: Haywood Pao, MD Referring: Haywood Pao, MD Visit Date: 08/01/2017 Occupation: @GUAROCC @    Subjective:  Other (left knee pain )   History of Present Illness: Cassidy Reeves is a 62 y.o. female history of psoriatic arthritis and psoriasis.  She states she continues to have pain and stiffness in her joints.  Has been having increased pain and discomfort in her bilateral knee joints and her SI joints.  She has been also having increased psoriasis.  Rash has been mostly on her lower extremities.  Activities of Daily Living:  Patient reports morning stiffness for 1-2 hours.   Patient Reports nocturnal pain.  Difficulty dressing/grooming: Denies Difficulty climbing stairs: Reports Difficulty getting out of chair: Reports Difficulty using hands for taps, buttons, cutlery, and/or writing: Reports   Review of Systems  Constitutional: Positive for fatigue and weakness. Negative for night sweats, weight gain and weight loss.  HENT: Negative for mouth sores, trouble swallowing, trouble swallowing, mouth dryness and nose dryness.   Eyes: Negative for pain, redness, visual disturbance and dryness.  Respiratory: Negative for cough, shortness of breath and difficulty breathing.   Cardiovascular: Negative for chest pain, palpitations, hypertension, irregular heartbeat and swelling in legs/feet.  Gastrointestinal: Negative for abdominal pain, blood in stool, constipation, diarrhea and nausea.  Endocrine: Negative for increased urination.  Genitourinary: Negative for pelvic pain and vaginal dryness.  Musculoskeletal: Positive for arthralgias, joint pain, joint swelling and morning stiffness. Negative for myalgias, muscle weakness, muscle tenderness and myalgias.  Skin: Positive for rash. Negative for color change, hair loss, redness, skin tightness, ulcers and  sensitivity to sunlight.  Allergic/Immunologic: Negative for susceptible to infections.  Neurological: Positive for numbness. Negative for dizziness, light-headedness, memory loss and night sweats.  Hematological: Negative for bruising/bleeding tendency and swollen glands.  Psychiatric/Behavioral: Negative for depressed mood, confusion and sleep disturbance. The patient is not nervous/anxious.     PMFS History:  Patient Active Problem List   Diagnosis Date Noted  . History of depression 05/22/2017  . Primary osteoarthritis of both hands 07/22/2016  . DDD lumbar spine 07/22/2016  . Trigger finger 07/10/2016  . Psoriatic arthropathy ([place]) 07/10/2016  . High risk medication use 06/13/2016  . Primary osteoarthritis of both knees 06/13/2016  . History of gastric bypass 06/13/2016  . Vitamin D deficiency 06/13/2016  . Psoriasis 02/18/2013  . 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 Anxiety   . Arthritis   . GERD (gastroesophageal reflux disease)   . History of kidney stones   . Pelvic mass     Family History  Problem Relation Age of Onset  . Heart disease Father   . Arthritis Father   . Psoriasis Father   . Parkinsonism Father   . Other Father 55       pacemaker  . Colon cancer Paternal Grandfather   . Heart failure Paternal Grandmother   . Stroke Maternal Grandmother   . Esophageal cancer Neg Hx   . Rectal cancer Neg Hx   . Stomach cancer Neg Hx    Past Surgical History:  Procedure Laterality Date  . ABDOMINAL HYSTERECTOMY    . APPENDECTOMY    .  BACK SURGERY    . CHOLECYSTECTOMY    . EXPLORATORY LAPAROTOMY    . GASTRIC BYPASS    . KNEE ARTHROSCOPY    . LITHOTRIPSY     Social History   Social History Narrative   Daily caffeine      Objective: Vital Signs: BP 126/72 (BP Location: Left Arm, Patient Position: Sitting, Cuff Size: Large)   Pulse 84   Resp 17   Ht 5' 6"  (1.676 m)   Wt 248 lb (112.5 kg)   BMI 40.03  kg/m    Physical Exam  Constitutional: She is oriented to person, place, and time. She appears well-developed and well-nourished.  HENT:  Head: Normocephalic and atraumatic.  Eyes: Conjunctivae and EOM are normal.  Neck: Normal range of motion.  Cardiovascular: Normal rate, regular rhythm, normal heart sounds and intact distal pulses.  Pulmonary/Chest: Effort normal and breath sounds normal.  Abdominal: Soft. Bowel sounds are normal.  Lymphadenopathy:    She has no cervical adenopathy.  Neurological: She is alert and oriented to person, place, and time.  Skin: Skin is warm and dry. Capillary refill takes less than 2 seconds. Rash noted.  Psoriasis patches on face and lower extremities  Psychiatric: She has a normal mood and affect. Her behavior is normal.  Nursing note and vitals reviewed.    Musculoskeletal Exam: C-spine thoracic spine good range of motion she discomfort range of motion of her lumbar spine.  She has some tenderness on palpation over left SI joint.  Shoulder joints elbow joints wrist joint MCPs PIPs with good range of motion with no synovitis.  She has warmth and swelling in her left knee joint.  Other joints full range of motion with no synovitis.  She did have tenderness across her PIPs and DIPs in her hands and her feet.  CDAI Exam: CDAI Homunculus Exam:   Tenderness:  RLE: tibiofemoral LLE: tibiofemoral  Joint Counts:  CDAI Tender Joint count: 2 CDAI Swollen Joint count: 0  Global Assessments:  Patient Global Assessment: 7 Provider Global Assessment: 7  CDAI Calculated Score: 16    Investigation: No additional findings.  TB Gold negative in May 2018. CBC Latest Ref Rng & Units 12/12/2016 09/24/2016 05/15/2016  WBC 3.8 - 10.8 K/uL 6.4 6.9 6.5  Hemoglobin 11.7 - 15.5 g/dL 12.3 12.5 12.1  Hematocrit 35.0 - 45.0 % 37.8 37.8 37.8  Platelets 140 - 400 K/uL 336 311 313   CMP Latest Ref Rng & Units 05/22/2017 12/12/2016 09/24/2016  Glucose 65 - 99 mg/dL - 85 85   BUN 7 - 25 mg/dL - 14 13  Creatinine 0.50 - 0.99 mg/dL - 0.66 0.63  Sodium 135 - 146 mmol/L - 140 142  Potassium 3.5 - 5.3 mmol/L - 4.8 4.5  Chloride 98 - 110 mmol/L - 103 104  CO2 20 - 31 mmol/L - 26 26  Calcium 8.6 - 10.4 mg/dL - 9.1 9.7  Total Protein 6.1 - 8.1 g/dL 7.1 7.1 7.4  Total Bilirubin 0.2 - 1.2 mg/dL - 0.3 0.3  Alkaline Phos 33 - 130 U/L - 88 84  AST 10 - 35 U/L - 22 23  ALT 6 - 29 U/L - 21 24   May 22, 2017 SPEP negative, CK 71, TSH 2.47, B12 normal, vitamin D low at 27, ESR 6 Imaging: No results found.  Speciality Comments: No specialty comments available.    Procedures:  Large Joint Inj: L knee on 08/01/2017 10:24 AM Indications: pain Details: 27 G 1.5 in  needle, medial approach  Arthrogram: No  Medications: 1.5 mL lidocaine 1 %; 60 mg triamcinolone acetonide 40 MG/ML Aspirate: 0 mL Outcome: tolerated well, no immediate complications Procedure, treatment alternatives, risks and benefits explained, specific risks discussed. Consent was given by the patient. Immediately prior to procedure a time out was called to verify the correct patient, procedure, equipment, support staff and site/side marked as required. Patient was prepped and draped in the usual sterile fashion.     Allergies: Dilaudid [hydromorphone hcl] and Penicillins   Assessment / Plan:     Visit Diagnoses: Psoriatic arthropathy ([place]): She has ongoing pain and discomfort in multiple joints.  She has warmth and swelling in her left knee joint.  She also has tenderness over left SI joint.  There is no synovitis noted in her hands and feet but she has significant discomfort.  She was to switch her medications.  She has been given Enbrel by Dr. Ubaldo Glassing for multiple years.  We had detailed discussion regarding different treatment options and their side effects.  Indication side effects contraindications of Cosyntex was discussed.  She wants to proceed with the medication.  We will apply for the medication.   I will also obtain following labs today.  Medication counseling: TB Gold: May 2018 Hepatitis panel: Pending HIV: Pending SPEP: January 2019 Immunoglobulin: Pending  Does patient have a history of inflammatory bowel disease? No  Counseled patient that Cosentyx is a IL-17 inhibitor that works to reduce pain and inflammation associated with arthritis.  Counseled patient on purpose, proper use, and adverse effects of Cosentyx. Reviewed the most common adverse effects of infection, inflammatory bowel disease, and allergic reaction.  Reviewed the importance of regular labs while on Cosentyx.  Counseled patient that Cosentyx should be held prior to scheduled surgery.  Counseled patient to avoid live vaccines while on Cosentyx.  Advised patient to get annual influenza vaccine and the pneumococcal vaccine as indicated.  Provided patient with medication education material and answered all questions.  Patient consented to Cosentyx.  Will upload consent into patient's chart.  Will apply for Cosentyx through patient's insurance.  Reviewed storage information for Cosentyx.  Advised initial injection must be administered in office.  Patient voiced understanding.     Psoriasis: She has psoriasis patches on her lower extremities.  High risk medication use - Enbrel every week by Dr. Ubaldo Glassing, clobetasol cream  Primary osteoarthritis of both hands  Primary osteoarthritis of both knees - status post left knee joint arthroscopic surgery for meniscal tear repair August 2018 by Dr. Durward Fortes  DDD (degenerative disc disease), lumbar - On Robaxin when necessary  Other fatigue  Other insomnia - Due to chronic pain. She is on Ambien daily at bedtime and Xanax when necessary.   History of vitamin D deficiency  History of depression - on Cymbalta 60 mg po qd  History of gastric bypass    Orders: Orders Placed This Encounter  Procedures  . Large Joint Inj  . CBC with Differential/Platelet  . COMPLETE  METABOLIC PANEL WITH GFR  . Hepatitis B core antibody, IgM  . Hepatitis B surface antigen  . Hepatitis C antibody  . HIV antibody  . QuantiFERON-TB Gold Plus  . IgG, IgA, IgM   No orders of the defined types were placed in this encounter.   Face-to-face time spent with patient was 30 minutes. Greater than 50% of time was spent in counseling and coordination of care.  Follow-Up Instructions: Return in about 3 months (around 11/01/2017) for Psoriatic  arthritis, Osteoarthritis,DDD.   Bo Merino, MD  Note - This record has been created using Editor, commissioning.  Chart creation errors have been sought, but may not always  have been located. Such creation errors do not reflect on  the standard of medical care.

## 2017-08-01 ENCOUNTER — Encounter: Payer: Self-pay | Admitting: Rheumatology

## 2017-08-01 ENCOUNTER — Ambulatory Visit: Payer: Managed Care, Other (non HMO) | Admitting: Rheumatology

## 2017-08-01 ENCOUNTER — Telehealth: Payer: Self-pay

## 2017-08-01 VITALS — BP 126/72 | HR 84 | Resp 17 | Ht 66.0 in | Wt 248.0 lb

## 2017-08-01 DIAGNOSIS — M170 Bilateral primary osteoarthritis of knee: Secondary | ICD-10-CM | POA: Diagnosis not present

## 2017-08-01 DIAGNOSIS — Z8659 Personal history of other mental and behavioral disorders: Secondary | ICD-10-CM

## 2017-08-01 DIAGNOSIS — M19042 Primary osteoarthritis, left hand: Secondary | ICD-10-CM | POA: Diagnosis not present

## 2017-08-01 DIAGNOSIS — Z9884 Bariatric surgery status: Secondary | ICD-10-CM | POA: Diagnosis not present

## 2017-08-01 DIAGNOSIS — L409 Psoriasis, unspecified: Secondary | ICD-10-CM | POA: Diagnosis not present

## 2017-08-01 DIAGNOSIS — M5136 Other intervertebral disc degeneration, lumbar region: Secondary | ICD-10-CM | POA: Diagnosis not present

## 2017-08-01 DIAGNOSIS — Z79899 Other long term (current) drug therapy: Secondary | ICD-10-CM | POA: Diagnosis not present

## 2017-08-01 DIAGNOSIS — M19041 Primary osteoarthritis, right hand: Secondary | ICD-10-CM | POA: Diagnosis not present

## 2017-08-01 DIAGNOSIS — R5383 Other fatigue: Secondary | ICD-10-CM

## 2017-08-01 DIAGNOSIS — Z8639 Personal history of other endocrine, nutritional and metabolic disease: Secondary | ICD-10-CM

## 2017-08-01 DIAGNOSIS — G4709 Other insomnia: Secondary | ICD-10-CM

## 2017-08-01 DIAGNOSIS — L4050 Arthropathic psoriasis, unspecified: Secondary | ICD-10-CM

## 2017-08-01 MED ORDER — TRIAMCINOLONE ACETONIDE 40 MG/ML IJ SUSP
60.0000 mg | INTRAMUSCULAR | Status: AC | PRN
  Administered 2017-08-01: 60 mg via INTRA_ARTICULAR

## 2017-08-01 MED ORDER — LIDOCAINE HCL 1 % IJ SOLN
1.5000 mL | INTRAMUSCULAR | Status: AC | PRN
Start: 2017-08-01 — End: 2017-08-01
  Administered 2017-08-01: 1.5 mL

## 2017-08-01 NOTE — Telephone Encounter (Signed)
Was asked by Dr. Estanislado Pandy to submit a prior authorization for Cosentyx to pts insurance. While completing the authorization I found that pts insurance will require that she tries and fails two of the preferred (Humira, Enbrel, Stelara or Remicaide). She has only had Enbrel.  Spoke with pt at her visit to discuss the "covered till you're covered" program through [employer]. If approved, pt will be able to receive free drug for 2 years while waiting for her insurance to approve. Patient consented to the program. If her insurance denies her approval for cosnetyx we will submit to the "covered till you're covered" program.   Patient voices understanding and denies any questions at this time.   Adaly Puder, [place], CPhT 2:30 PM

## 2017-08-01 NOTE — Patient Instructions (Signed)
Secukinumab injection What is this medicine? SECUKINUMAB (sek ue KIN ue mab) is used to treat psoriasis. It is also used to treat psoriatic arthritis and ankylosing spondylitis. This medicine may be used for other purposes; ask your health care provider or pharmacist if you have questions. COMMON BRAND NAME(S): Cosentyx What should I tell my health care provider before I take this medicine? They need to know if you have any of these conditions: -Crohn's disease, ulcerative colitis, or other inflammatory bowel disease -infection or history of infection -other conditions affecting the immune system -recently received or are scheduled to receive a vaccine -tuberculosis, a positive skin test for tuberculosis, or have recently been in close contact with someone who has tuberculosis -an unusual or allergic reaction to secukinumab, other medicines, latex, rubber, foods, dyes, or preservatives -pregnant or trying to get pregnant -breast-feeding How should I use this medicine? This medicine is for injection under the skin. It may be administered by a healthcare professional in a hospital or clinic setting or at home. If you get this medicine at home, you will be taught how to prepare and give this medicine. Use exactly as directed. Take your medicine at regular intervals. Do not take your medicine more often than directed. It is important that you put your used needles and syringes in a special sharps container. Do not put them in a trash can. If you do not have a sharps container, call your pharmacist or healthcare provider to get one. A special MedGuide will be given to you by the pharmacist with each prescription and refill. Be sure to read this information carefully each time. Talk to your pediatrician regarding the use of this medicine in children. Special care may be needed. Overdosage: If you think you have taken too much of this medicine contact a poison control center or emergency room at  once. NOTE: This medicine is only for you. Do not share this medicine with others. What if I miss a dose? It is important not to miss your dose. Call your doctor of health care professional if you are unable to keep an appointment. If you give yourself the medicine and you miss a dose, take it as soon as you can. If it is almost time for your next dose, take only that dose. Do not take double or extra doses. What may interact with this medicine? Do not take this medicine with any of the following medications: -live virus vaccines This medicine may also interact with the following medications: -cyclosporine -inactivated vaccines -warfarin This list may not describe all possible interactions. Give your health care provider a list of all the medicines, herbs, non-prescription drugs, or dietary supplements you use. Also tell them if you smoke, drink alcohol, or use illegal drugs. Some items may interact with your medicine. What should I watch for while using this medicine? Tell your doctor or healthcare professional if your symptoms do not start to get better or if they get worse. You will be tested for tuberculosis (TB) before you start this medicine. If your doctor prescribes any medicine for TB, you should start taking the TB medicine before starting this medicine. Make sure to finish the full course of TB medicine. Call your doctor or healthcare professional for advice if you get a fever, chills or sore throat, or other symptoms of a cold or flu. Do not treat yourself. This drug decreases your body's ability to fight infections. Try to avoid being around people who are sick. This medicine can decrease   the response to a vaccine. If you need to get vaccinated, tell your healthcare professional if you have received this medicine within the last 6 months. Extra booster doses may be needed. Talk to your doctor to see if a different vaccination schedule is needed. What side effects may I notice from  receiving this medicine? Side effects that you should report to your doctor or health care professional as soon as possible: -allergic reactions like skin rash, itching or hives, swelling of the face, lips, or tongue -signs and symptoms of infection like fever or chills; cough; sore throat; pain or trouble passing urine Side effects that usually do not require medical attention (report to your doctor or health care professional if they continue or are bothersome): -diarrhea This list may not describe all possible side effects. Call your doctor for medical advice about side effects. You may report side effects to FDA at 1-800-FDA-1088. Where should I keep my medicine? Keep out of the reach of children. Store the prefilled syringe or injection pen in a refrigerator between 2 to 8 degrees C (36 to 46 degrees F). Keep the syringe or the pen in the original carton until ready for use. Protect from light. Do not freeze. Do not shake. Prior to use, remove the syringe or pen from the refrigerator and use within 1 hour. Throw away any unused medicine after the expiration date on the label. NOTE: This sheet is a summary. It may not cover all possible information. If you have questions about this medicine, talk to your doctor, pharmacist, or health care provider.  2018 Elsevier/Gold Standard (2015-06-08 11:48:31)  

## 2017-08-03 LAB — IGG, IGA, IGM
IgG (Immunoglobin G), Serum: 1134 mg/dL (ref 694–1618)
IgM, Serum: 100 mg/dL (ref 48–271)
Immunoglobulin A: 238 mg/dL (ref 81–463)

## 2017-08-03 LAB — HEPATITIS C ANTIBODY
HEP C AB: NONREACTIVE
SIGNAL TO CUT-OFF: 0.03 (ref <1.00)

## 2017-08-03 LAB — CBC WITH DIFFERENTIAL/PLATELET
Basophils Absolute: 97 cells/uL (ref 0–200)
Basophils Relative: 1.7 %
Eosinophils Absolute: 239 cells/uL (ref 15–500)
Eosinophils Relative: 4.2 %
HCT: 35.2 % (ref 35.0–45.0)
Hemoglobin: 11.3 g/dL — ABNORMAL LOW (ref 11.7–15.5)
Lymphs Abs: 2713 cells/uL (ref 850–3900)
MCH: 24.4 pg — ABNORMAL LOW (ref 27.0–33.0)
MCHC: 32.1 g/dL (ref 32.0–36.0)
MCV: 75.9 fL — AB (ref 80.0–100.0)
MONOS PCT: 10.3 %
MPV: 11.2 fL (ref 7.5–12.5)
NEUTROS PCT: 36.2 %
Neutro Abs: 2063 cells/uL (ref 1500–7800)
PLATELETS: 326 10*3/uL (ref 140–400)
RBC: 4.64 10*6/uL (ref 3.80–5.10)
RDW: 13.2 % (ref 11.0–15.0)
TOTAL LYMPHOCYTE: 47.6 %
WBC: 5.7 10*3/uL (ref 3.8–10.8)
WBCMIX: 587 {cells}/uL (ref 200–950)

## 2017-08-03 LAB — COMPLETE METABOLIC PANEL WITH GFR
AG RATIO: 1.5 (calc) (ref 1.0–2.5)
ALBUMIN MSPROF: 4.3 g/dL (ref 3.6–5.1)
ALT: 20 U/L (ref 6–29)
AST: 17 U/L (ref 10–35)
Alkaline phosphatase (APISO): 84 U/L (ref 33–130)
BUN: 19 mg/dL (ref 7–25)
CALCIUM: 9.8 mg/dL (ref 8.6–10.4)
CO2: 30 mmol/L (ref 20–32)
CREATININE: 0.76 mg/dL (ref 0.50–0.99)
Chloride: 102 mmol/L (ref 98–110)
GFR, EST NON AFRICAN AMERICAN: 84 mL/min/{1.73_m2} (ref >=60)
GFR, Est African American: 97 mL/min/{1.73_m2} (ref >=60)
GLOBULIN: 2.8 g/dL (ref 1.9–3.7)
Glucose, Bld: 87 mg/dL (ref 65–99)
POTASSIUM: 4.4 mmol/L (ref 3.5–5.3)
SODIUM: 139 mmol/L (ref 135–146)
Total Bilirubin: 0.3 mg/dL (ref 0.2–1.2)
Total Protein: 7.1 g/dL (ref 6.1–8.1)

## 2017-08-03 LAB — QUANTIFERON-TB GOLD PLUS
Mitogen-NIL: >10 IU/mL
NIL: 0.07 [IU]/mL
QUANTIFERON-TB GOLD PLUS: NEGATIVE
TB1-NIL: <0 IU/mL
TB2-NIL: <0 IU/mL

## 2017-08-03 LAB — HEPATITIS B SURFACE ANTIGEN: Hepatitis B Surface Ag: NONREACTIVE

## 2017-08-03 LAB — HEPATITIS B CORE ANTIBODY, IGM: HEP B C IGM: NONREACTIVE

## 2017-08-03 LAB — HIV ANTIBODY (ROUTINE TESTING W REFLEX): HIV 1&2 Ab, 4th Generation: NONREACTIVE

## 2017-08-04 NOTE — Progress Notes (Signed)
WNL

## 2017-08-04 NOTE — Progress Notes (Signed)
Mild anemia, please fax results to her PCP

## 2017-08-07 ENCOUNTER — Telehealth: Payer: Self-pay

## 2017-08-07 NOTE — Telephone Encounter (Addendum)
Received a fax from [employer] regarding a prior authorization [place] for [place]. Patient must try and fail two preferred products (enbrel, humira, remicade or stelara. She has only failed one. As discussed at her previous office visit, we will proceed with the covered till you're covered program with Cosentyx.    Reference number: [id number] Phone number:[phone]  Will send document to scan center.  Called pt to update. She voices understanding and denies any questions at this time.  Shuna Tabor, [place], CPhT 12:14 PM

## 2017-08-07 NOTE — Telephone Encounter (Signed)
Application has been completed by pt and provider. Fax application to Cosenty[employer] Covert till Covered program. If pt is approved, the first refill will be delivered to the clinic and medication will be given in the office.   Will update once we have a response.   Ramondo Dietze, [place], CPhT 3:03 PM

## 2017-08-07 NOTE — Telephone Encounter (Signed)
Received a fax from [employer] stating that they received the faxed request for covered till covered program. They will process the fax within 4 business hours.  Will update once we have a response.   Will send document to scan center.  Phone: [phone]  Demetrios Loll  4:17 PM

## 2017-08-08 NOTE — Telephone Encounter (Signed)
Received a fax from [employer] stating that the application was missing the date beside the providers signature. Application has been adjusted and application has been faxed back.   Will send document to scan center.  Will update once we have a response.   Lennyn Bellanca, [place], CPhT 1:56 PM

## 2017-08-11 NOTE — Telephone Encounter (Signed)
Received a fax from [employer] stating that the received service request form was complete. No additional paperwork is required at this time. They will conduct a benefits investigation and relay the results to the clinic and patient.   Will send document to scan center.  Will update once we have a response.   Tried to call pt to update. Could not leave a message.  Millianna Szymborski, [place], CPhT 11:32 AM

## 2017-08-12 ENCOUNTER — Other Ambulatory Visit: Payer: Self-pay | Admitting: *Deleted

## 2017-08-12 MED ORDER — METHOCARBAMOL 500 MG PO TABS: 500.0000 mg | ORAL_TABLET | Freq: Three times a day (TID) | ORAL | 2 refills | Status: DC | PRN

## 2017-08-12 NOTE — Telephone Encounter (Signed)
Refill request received via fax  Last Visit: 08/01/17 Next Visit: 11/03/17  Okay to refill per Dr. Estanislado Pandy

## 2017-08-14 ENCOUNTER — Other Ambulatory Visit: Payer: Self-pay | Admitting: Rheumatology

## 2017-08-14 NOTE — Telephone Encounter (Signed)
Received a fax from [employer] Covered till Covered program stating that a BIV has been conducted for pt. Coverage for Cosentyx may be available for your patient following a prior authorization. A PA have already been submitted and received a denial. Called program to clarify.   Spoke with Louie Casa who states that the denial letter has been received. They had to confirm with insurance. The case manager will have to process and authorize the Rx. Then it will be sent to the pharmacy for verification and processing which usually takes about 24-48 hours. They will then reach out to the pt to schedule shipment.   Will send document to scan center.  Called pt to update. Patient voices understanding and denies any questions at this time.   Tirrell Buchberger, [place], CPhT 8:55 AM

## 2017-08-14 NOTE — Telephone Encounter (Signed)
Patient advised she will need her Vitamin D rechecked before we refill medication.

## 2017-08-19 ENCOUNTER — Telehealth: Payer: Self-pay | Admitting: Rheumatology

## 2017-08-19 NOTE — Telephone Encounter (Signed)
Alyssa from [employer] [place] Covered Program called stating that the Cosentyx will be delivered tomorrow.  If you have any questions regarding the delivery, please contact the office at [phone]

## 2017-08-19 NOTE — Telephone Encounter (Signed)
Will update once medication has been received.   Cassidy Reeves, [place], CPhT 11:27 AM

## 2017-08-20 NOTE — Telephone Encounter (Signed)
Received Cosentyx from [facility]. Patient should have gotten a loading dose of 5 pens. Only one box (one dose) was delivered. Called pharmacy to verify that pt would get the remaining at the same time. Spoke with Vikki Ports who states that when the med is shipped out, they only ship one dose at a time to make sure the pt don't have any side effects. Once pt takes her first dose, she will call the pharmacy to schedule her next dose. Thereafter, the pharmacy will contact pt to schedule her refills. The patient is aware of this.   Medication has been placed in the refrigerator. Please schedule nursing visit. Thanks!  Marsella Suman, [place], CPhT 1:47 PM

## 2017-08-20 NOTE — Telephone Encounter (Signed)
Patient has been scheduled for 09/04/17 at 10 am

## 2017-08-20 NOTE — Telephone Encounter (Signed)
OrthoVisc Bil patient purchased approved, pending shipment

## 2017-08-21 ENCOUNTER — Telehealth: Payer: Self-pay

## 2017-08-21 NOTE — Telephone Encounter (Signed)
Received a fax from [employer] till Covered program stating that they have confirmed that th PA for pt has been denied by insurance. Pt will be enrolled into the program that provides Cosentyx free of charge for up to 2 years. In order for the pt to remain enrolled in the program, we are required to submit an appeal to pts insruance within 90 days of the date of the denial of coverage/within the first 90 days of enrollment. Called program to verify the date. Spoke with Tamika who states that an appeal must be submitted within 90 days of enrollment. Pts enrollment start date is August 18, 2017.   Pts next scheduled appointment is on 11/03/17. Please submit appeal to pts insurance after her appointment with updated information. A verbal peer-to peer review, please call [facility] directly [phone]) Send required documents to:  [facility] Fax: [phone]  Will send document to scan center.  Dakhari Zuver, [place], CPhT 9:55 AM

## 2017-08-27 ENCOUNTER — Telehealth (INDEPENDENT_AMBULATORY_CARE_PROVIDER_SITE_OTHER): Payer: Self-pay | Admitting: *Deleted

## 2017-08-27 NOTE — Telephone Encounter (Signed)
Please call patient and schedule appts w/[facility] for Orthovisc x 4, bilateral, patient purchased. Thank you.

## 2017-08-27 NOTE — Telephone Encounter (Signed)
Please call patient and schedule appts with Lovena Le, Ortho Visc x 4 bilateral, patient puchased. Thank you.

## 2017-08-28 ENCOUNTER — Telehealth: Payer: Self-pay

## 2017-08-28 NOTE — Telephone Encounter (Signed)
LMOM for patient to call and schedule injections. °

## 2017-08-28 NOTE — Telephone Encounter (Signed)
Received a refill request via fax for vitamin D 50,000 units.   Attempted to call patient and left message to advise patient the vitamin D level would need to be re-checked before we could refill the medication.

## 2017-09-04 ENCOUNTER — Ambulatory Visit: Payer: Managed Care, Other (non HMO) | Admitting: Physician Assistant

## 2017-09-04 ENCOUNTER — Ambulatory Visit (INDEPENDENT_AMBULATORY_CARE_PROVIDER_SITE_OTHER): Payer: Managed Care, Other (non HMO) | Admitting: *Deleted

## 2017-09-04 VITALS — BP 144/73 | HR 72

## 2017-09-04 DIAGNOSIS — L409 Psoriasis, unspecified: Secondary | ICD-10-CM

## 2017-09-04 DIAGNOSIS — M170 Bilateral primary osteoarthritis of knee: Secondary | ICD-10-CM

## 2017-09-04 DIAGNOSIS — L4050 Arthropathic psoriasis, unspecified: Secondary | ICD-10-CM

## 2017-09-04 MED ORDER — LIDOCAINE HCL 1 % IJ SOLN
1.5000 mL | INTRAMUSCULAR | Status: AC | PRN
  Administered 2017-09-04: 1.5 mL

## 2017-09-04 MED ORDER — SECUKINUMAB 150 MG/ML ~~LOC~~ SOAJ
300.0000 mg | Freq: Once | SUBCUTANEOUS | Status: AC
  Administered 2017-09-04: 300 mg via SUBCUTANEOUS

## 2017-09-04 MED ORDER — HYALURONAN 30 MG/2ML IX SOSY
30.0000 mg | PREFILLED_SYRINGE | INTRA_ARTICULAR | Status: AC | PRN
  Administered 2017-09-04: 30 mg via INTRA_ARTICULAR

## 2017-09-04 NOTE — Progress Notes (Signed)
   Procedure Note  Patient: Cassidy Reeves             Date of Birth: 1955/08/03           MRN: [id number]             Visit Date: 09/04/2017  Procedures: Visit Diagnoses: Primary osteoarthritis of both knees Orthovisc #1 bilateral P/P Large Joint Inj: bilateral knee on 09/04/2017 9:12 AM Indications: pain Details: 25 G 1.5 in needle, medial approach  Arthrogram: No  Medications (Right): 30 mg Hyaluronan 30 MG/2ML; 1.5 mL lidocaine 1 % Aspirate (Right): 0 mL Medications (Left): 30 mg Hyaluronan 30 MG/2ML; 1.5 mL lidocaine 1 % Aspirate (Left): 0 mL Outcome: tolerated well, no immediate complications Procedure, treatment alternatives, risks and benefits explained, specific risks discussed. Consent was given by the patient. Immediately prior to procedure a time out was called to verify the correct patient, procedure, equipment, support staff and site/side marked as required. Patient was prepped and draped in the usual sterile fashion.     Patient tolerated the procedure well.   Hazel Sams, PA-C

## 2017-09-04 NOTE — Patient Instructions (Signed)
Standing Labs We placed an order today for your standing lab work.    Please come back and get your standing labs in 1 month and every 3 months  We have open lab Monday through Friday from 8:30-11:30 AM and 1:30-4:00 PM  at the office of Dr. Shaili Deveshwar.   You may experience shorter wait times on Monday and Friday afternoons. The office is located at [address], [address], [place],  [address] No appointment is necessary.   Labs are drawn by [facility].  You may receive a bill from [facility] for your lab work. If you have any questions regarding directions or hours of operation,  please call [phone].     

## 2017-09-04 NOTE — Progress Notes (Signed)
Patient in office for a new start to consentyx. Patient is switching from Enbrel to Cosentyx. Patient states her last Enbrel injection was about 2 weeks ago. Patient was given a demonstration on the proper technique to self administer medication. Patient was able to demonstrate the proper technique. Patient was given given injections in right and left thighs. Patient tolerated injections well. Patient monitored in office for 30 minutes after administration for adverse reactions. No adverse reactions noted.   Administrations This Visit    Secukinumab SOAJ 300 mg    Admin Date 09/04/2017 Action Given Dose 300 mg Route Subcutaneous Administered By Carole Binning, LPN

## 2017-09-11 ENCOUNTER — Ambulatory Visit (INDEPENDENT_AMBULATORY_CARE_PROVIDER_SITE_OTHER): Payer: Managed Care, Other (non HMO) | Admitting: Physician Assistant

## 2017-09-11 DIAGNOSIS — M170 Bilateral primary osteoarthritis of knee: Secondary | ICD-10-CM

## 2017-09-11 MED ORDER — LIDOCAINE HCL 1 % IJ SOLN
1.5000 mL | INTRAMUSCULAR | Status: AC | PRN
  Administered 2017-09-11: 1.5 mL

## 2017-09-11 MED ORDER — HYALURONAN 30 MG/2ML IX SOSY
30.0000 mg | PREFILLED_SYRINGE | INTRA_ARTICULAR | Status: AC | PRN
  Administered 2017-09-11: 30 mg via INTRA_ARTICULAR

## 2017-09-11 NOTE — Progress Notes (Signed)
   Procedure Note  Patient: Renaye Janicki             Date of Birth: Jul 06, 1955           MRN: [id number]             Visit Date: 09/11/2017  Procedures: Visit Diagnoses: Primary osteoarthritis of both knees Orthovisc #2 bilateral P/P Large Joint Inj: bilateral knee on 09/11/2017 8:23 AM Indications: pain Details: 25 G 1.5 in needle, medial approach  Arthrogram: No  Medications (Right): 30 mg Hyaluronan 30 MG/2ML; 1.5 mL lidocaine 1 % Aspirate (Right): 0 mL Medications (Left): 30 mg Hyaluronan 30 MG/2ML; 1.5 mL lidocaine 1 % Aspirate (Left): 0 mL Outcome: tolerated well, no immediate complications Procedure, treatment alternatives, risks and benefits explained, specific risks discussed. Consent was given by the patient. Immediately prior to procedure a time out was called to verify the correct patient, procedure, equipment, support staff and site/side marked as required. Patient was prepped and draped in the usual sterile fashion.      Patient tolerated the procedure well.   Hazel Sams, PA-C

## 2017-09-18 ENCOUNTER — Ambulatory Visit (INDEPENDENT_AMBULATORY_CARE_PROVIDER_SITE_OTHER): Payer: Managed Care, Other (non HMO) | Admitting: Physician Assistant

## 2017-09-18 DIAGNOSIS — M170 Bilateral primary osteoarthritis of knee: Secondary | ICD-10-CM | POA: Diagnosis not present

## 2017-09-18 MED ORDER — LIDOCAINE HCL 1 % IJ SOLN
1.5000 mL | INTRAMUSCULAR | Status: AC | PRN
  Administered 2017-09-18: 1.5 mL

## 2017-09-18 MED ORDER — HYALURONAN 30 MG/2ML IX SOSY
30.0000 mg | PREFILLED_SYRINGE | INTRA_ARTICULAR | Status: AC | PRN
  Administered 2017-09-18: 30 mg via INTRA_ARTICULAR

## 2017-09-18 NOTE — Progress Notes (Signed)
   Procedure Note  Patient: Cassidy Reeves             Date of Birth: 1955-11-13           MRN: [id number]             Visit Date: 09/18/2017  Procedures: Visit Diagnoses: Primary osteoarthritis of both knees Orthovisc #3 bilateral P/P Large Joint Inj: bilateral knee on 09/18/2017 9:30 AM Indications: pain Details: 25 G 1.5 in needle, medial approach  Arthrogram: No  Medications (Right): 30 mg Hyaluronan 30 MG/2ML; 1.5 mL lidocaine 1 % Aspirate (Right): 0 mL Medications (Left): 30 mg Hyaluronan 30 MG/2ML; 1.5 mL lidocaine 1 % Aspirate (Left): 0 mL Outcome: tolerated well, no immediate complications Procedure, treatment alternatives, risks and benefits explained, specific risks discussed. Consent was given by the patient. Immediately prior to procedure a time out was called to verify the correct patient, procedure, equipment, support staff and site/side marked as required. Patient was prepped and draped in the usual sterile fashion.      Patient tolerated the procedure well.    Hazel Sams, PA-C

## 2017-09-25 ENCOUNTER — Ambulatory Visit: Payer: Managed Care, Other (non HMO) | Admitting: Physician Assistant

## 2017-09-25 DIAGNOSIS — M170 Bilateral primary osteoarthritis of knee: Secondary | ICD-10-CM | POA: Diagnosis not present

## 2017-09-25 MED ORDER — HYALURONAN 30 MG/2ML IX SOSY
30.0000 mg | PREFILLED_SYRINGE | INTRA_ARTICULAR | Status: AC | PRN
  Administered 2017-09-25: 30 mg via INTRA_ARTICULAR

## 2017-09-25 MED ORDER — LIDOCAINE HCL 1 % IJ SOLN
1.5000 mL | INTRAMUSCULAR | Status: AC | PRN
  Administered 2017-09-25: 1.5 mL

## 2017-09-25 NOTE — Progress Notes (Signed)
   Procedure Note  Patient: Cassidy Reeves             Date of Birth: August 11, 1955           MRN: [id number]             Visit Date: 09/25/2017  Procedures: Visit Diagnoses: Primary osteoarthritis of both knees Orthovisc #4 bilateral P/P  Large Joint Inj: bilateral knee on 09/25/2017 9:52 AM Indications: pain Details: 25 G 1.5 in needle, medial approach  Arthrogram: No  Medications (Right): 30 mg Hyaluronan 30 MG/2ML; 1.5 mL lidocaine 1 % Aspirate (Right): 0 mL Medications (Left): 30 mg Hyaluronan 30 MG/2ML; 1.5 mL lidocaine 1 % Aspirate (Left): 0 mL Outcome: tolerated well, no immediate complications Procedure, treatment alternatives, risks and benefits explained, specific risks discussed. Consent was given by the patient. Immediately prior to procedure a time out was called to verify the correct patient, procedure, equipment, support staff and site/side marked as required. Patient was prepped and draped in the usual sterile fashion.     Patient tolerated the procedure well.    Hazel Sams, PA-C

## 2017-09-26 ENCOUNTER — Ambulatory Visit: Payer: Managed Care, Other (non HMO) | Admitting: Rheumatology

## 2017-10-16 ENCOUNTER — Telehealth: Payer: Self-pay

## 2017-10-16 NOTE — Telephone Encounter (Signed)
Received a call from Cosnety[employer] Covered till covered program stating that Cosentyx is now a first line approved med for [employer]. Patient is currently enrolled in the covered till covered program. Will submit a prior authorization with pts insurance. If pt is approved, we will get her enrolled in the co-pay card program. If pt is denied, she will continue to use the Covered till covered program.   Will submit a prior authorization to pts insurance and update once we have a response.   Shann Lewellyn, [place], CPhT 10:10 AM

## 2017-10-20 NOTE — Progress Notes (Deleted)
Office Visit Note  Patient: Cassidy Reeves             Date of Birth: 03-14-1956           MRN: [id number]             PCP: Haywood Pao, MD Referring: Haywood Pao, MD Visit Date: 11/03/2017 Occupation: @GUAROCC @    Subjective:  No chief complaint on file.   History of Present Illness: Cassidy Reeves is a 62 y.o. female ***   Activities of Daily Living:  Patient reports morning stiffness for *** {minute/hour:19697}.   Patient {ACTIONS;DENIES/REPORTS:21021675::"Denies"} nocturnal pain.  Difficulty dressing/grooming: {ACTIONS;DENIES/REPORTS:21021675::"Denies"} Difficulty climbing stairs: {ACTIONS;DENIES/REPORTS:21021675::"Denies"} Difficulty getting out of chair: {ACTIONS;DENIES/REPORTS:21021675::"Denies"} Difficulty using hands for taps, buttons, cutlery, and/or writing: {ACTIONS;DENIES/REPORTS:21021675::"Denies"}   No Rheumatology ROS completed.   PMFS History:  Patient Active Problem List   Diagnosis Date Noted  . History of depression 05/22/2017  . Primary osteoarthritis of both hands 07/22/2016  . DDD lumbar spine 07/22/2016  . Trigger finger 07/10/2016  . 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 High risk medication use 06/13/2016  . Primary osteoarthritis of both knees 06/13/2016  . History of gastric bypass 06/13/2016  . Vitamin D deficiency 06/13/2016  . Psoriasis 02/18/2013  . 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 Anxiety   . Arthritis   . GERD (gastroesophageal reflux disease)   . History of kidney stones   . Pelvic mass     Family History  Problem Relation Age of Onset  . Heart disease Father   . Arthritis Father   . Psoriasis Father   . Parkinsonism Father   . Other Father 52       pacemaker  . Colon cancer Paternal Grandfather   . Heart failure Paternal Grandmother   . Stroke Maternal Grandmother   . Esophageal cancer Neg Hx   . Rectal cancer Neg Hx   . Stomach cancer Neg Hx    Past  Surgical History:  Procedure Laterality Date  . ABDOMINAL HYSTERECTOMY    . APPENDECTOMY    . BACK SURGERY    . CHOLECYSTECTOMY    . EXPLORATORY LAPAROTOMY    . GASTRIC BYPASS    . KNEE ARTHROSCOPY    . LITHOTRIPSY     Social History   Social History Narrative   Daily caffeine      Objective: Vital Signs: There were no vitals taken for this visit.   Physical Exam   Musculoskeletal Exam: ***  CDAI Exam: No CDAI exam completed.    Investigation: No additional findings. CBC Latest Ref Rng & Units 08/01/2017 12/12/2016 09/24/2016  WBC 3.8 - 10.8 Thousand/uL 5.7 6.4 6.9  Hemoglobin 11.7 - 15.5 g/dL 11.3(L) 12.3 12.5  Hematocrit 35.0 - 45.0 % 35.2 37.8 37.8  Platelets 140 - 400 Thousand/uL 326 336 311   CMP Latest Ref Rng & Units 08/01/2017 05/22/2017 12/12/2016  Glucose 65 - 99 mg/dL 87 - 85  BUN 7 - 25 mg/dL 19 - 14  Creatinine 0.50 - 0.99 mg/dL 0.76 - 0.66  Sodium 135 - 146 mmol/L 139 - 140  Potassium 3.5 - 5.3 mmol/L 4.4 - 4.8  Chloride 98 - 110 mmol/L 102 - 103  CO2 20 - 32 mmol/L 30 - 26  Calcium 8.6 - 10.4 mg/dL 9.8 - 9.1  Total Protein 6.1 - 8.1 g/dL 7.1 7.1 7.1  Total Bilirubin 0.2 - 1.2 mg/dL 0.3 - 0.3  Alkaline Phos 33 - 130 U/L - - 88  AST 10 - 35 U/L 17 - 22  ALT 6 - 29 U/L 20 - 21     Imaging: No results found.  Speciality Comments: No specialty comments available.    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  No orders of the defined types were placed in this encounter.   Face-to-face time spent with patient was *** minutes. 50% of time was spent in counseling and coordination of care.  Follow-Up Instructions: No follow-ups on file.   Ofilia Neas, PA-C  Note - This record has been created using Dragon software.  Chart creation errors have been sought, but may not always  have been located. Such creation errors do not reflect on  the standard of medical care.

## 2017-10-21 NOTE — Telephone Encounter (Signed)
Pt called stating that she received a letter from cover till covered program stating that an appeal was needed. Informed her that an appeal must be submitted within 90 days of enrollment. She was enrolled on April 1st. Will reach out to insurance to check status of pts authorization and update with a response. Pt voices understanding and denies any questions at this time.   Kana Reimann, [place], CPhT 8:57 AM

## 2017-10-23 NOTE — Telephone Encounter (Addendum)
Called [employer] to check the status of Cosentxy PA. Spoke with Claiborne Billings who states that the authorization was denied because it did not meet eligibility requirements. The dosing for PSA is 150mg  at weeks 0, 1, 2, 3, and 4 followed by 150mg s every 4 weeks. Dosing may be increased to 300mg  if the patient continue to have active disease.   Appeals can be submitted to: (be sure to include additional information that will support the request, ex: labs, office visit notes, etc) We should receive a response as quickly as possible but no later [place] 60 [place] [address] Phone: [phone] Fax: [phone]  Patient is currently enrolled in the Covered till Covered program. An appeal must be submitted within 90 days of enrollment. Pts enrollment started on April 1st. Please submit an appeal and follow up with [employer] Covered till covered program. [phone]) Thanks!  Shaneisha Burkel, [place], CPhT 9:16 AM

## 2017-11-03 ENCOUNTER — Ambulatory Visit: Payer: Managed Care, Other (non HMO) | Admitting: Physician Assistant

## 2017-11-03 ENCOUNTER — Telehealth: Payer: Self-pay | Admitting: Rheumatology

## 2017-11-03 NOTE — Telephone Encounter (Signed)
Christine from [employer] called to see if the office received an approval or denial from [place].  Please call [phone] ext [phone]

## 2017-11-06 NOTE — Telephone Encounter (Signed)
Advised Eve that patient has an appointment on 11/07/17 and we will submit appeal after patient has been seen. She will document this information in patient's chart.

## 2017-11-07 ENCOUNTER — Encounter: Payer: Self-pay | Admitting: Physician Assistant

## 2017-11-07 ENCOUNTER — Ambulatory Visit (INDEPENDENT_AMBULATORY_CARE_PROVIDER_SITE_OTHER): Payer: Managed Care, Other (non HMO) | Admitting: Physician Assistant

## 2017-11-07 VITALS — BP 130/84 | HR 86 | Resp 16 | Ht 66.0 in | Wt 249.0 lb

## 2017-11-07 DIAGNOSIS — M47816 Spondylosis without myelopathy or radiculopathy, lumbar region: Secondary | ICD-10-CM

## 2017-11-07 DIAGNOSIS — L409 Psoriasis, unspecified: Secondary | ICD-10-CM

## 2017-11-07 DIAGNOSIS — M19042 Primary osteoarthritis, left hand: Secondary | ICD-10-CM

## 2017-11-07 DIAGNOSIS — Z79899 Other long term (current) drug therapy: Secondary | ICD-10-CM | POA: Diagnosis not present

## 2017-11-07 DIAGNOSIS — M25562 Pain in left knee: Secondary | ICD-10-CM

## 2017-11-07 DIAGNOSIS — L4050 Arthropathic psoriasis, unspecified: Secondary | ICD-10-CM | POA: Diagnosis not present

## 2017-11-07 DIAGNOSIS — Z9884 Bariatric surgery status: Secondary | ICD-10-CM

## 2017-11-07 DIAGNOSIS — Z8719 Personal history of other diseases of the digestive system: Secondary | ICD-10-CM | POA: Diagnosis not present

## 2017-11-07 DIAGNOSIS — Z8659 Personal history of other mental and behavioral disorders: Secondary | ICD-10-CM | POA: Diagnosis not present

## 2017-11-07 DIAGNOSIS — E559 Vitamin D deficiency, unspecified: Secondary | ICD-10-CM

## 2017-11-07 DIAGNOSIS — M19041 Primary osteoarthritis, right hand: Secondary | ICD-10-CM | POA: Diagnosis not present

## 2017-11-07 DIAGNOSIS — G8929 Other chronic pain: Secondary | ICD-10-CM

## 2017-11-07 DIAGNOSIS — M170 Bilateral primary osteoarthritis of knee: Secondary | ICD-10-CM

## 2017-11-07 MED ORDER — DICLOFENAC SODIUM 2 % TD SOLN: TRANSDERMAL | 1 refills | Status: DC

## 2017-11-07 MED ORDER — TRIAMCINOLONE ACETONIDE 40 MG/ML IJ SUSP
60.0000 mg | INTRAMUSCULAR | Status: AC | PRN
  Administered 2017-11-07: 60 mg via INTRA_ARTICULAR

## 2017-11-07 MED ORDER — LIDOCAINE HCL 1 % IJ SOLN
1.5000 mL | INTRAMUSCULAR | Status: AC | PRN
  Administered 2017-11-07: 1.5 mL

## 2017-11-07 NOTE — Progress Notes (Signed)
Office Visit Note  Patient: Cassidy Reeves             Date of Birth: 27-Jan-1956           MRN: [id number]             PCP: Haywood Pao, MD Referring: Haywood Pao, MD Visit Date: 11/07/2017 Occupation: @GUAROCC @    Subjective:  Left knee pain   History of Present Illness: Cassidy Reeves is a 62 y.o. female with history of psoriatic arthritis and osteoarthritis.  Patient continues to tolerate Cosentyx injections.  She reports that she has some discomfort in her bilateral hands with occasional mild swelling.  She has no discomfort or swelling in her feet at this time.  She reports that her SI joint pain is resolved.  She denies any plantar fasciitis or Achilles tendinitis.  She continues to have morning stiffness lasting about 1 to 2 hours.  She reports that her psoriasis has begun to clear especially on her bilateral elbows.  She reports that overall her psoriatic arthritis pain has improved.  She has been backing off taking Ambien due to being sleeping better at night since she is in less pain.  She says she is also got a new mattress which has been helping with her sleep.  She states that about a week before her next Cosentyx injection exercise having more breakthrough pain.  She takes ibuprofen for pain relief.  She states that she continues to have chronic pain in her left knee.  She states her left knee feels swollen and warm.  She denies any pain in her right knee.  She feels that the recent gel injections did help.  Her last cortisone injection of the left knee was on 08/01/2017.     Activities of Daily Living:  Patient reports morning stiffness for 1-2  hours.   Patient Denies nocturnal pain.  Difficulty dressing/grooming: Denies Difficulty climbing stairs: Reports Difficulty getting out of chair: Reports Difficulty using hands for taps, buttons, cutlery, and/or writing: Denies   Review of Systems  Constitutional: Negative for fatigue.  HENT: Negative for mouth sores,  mouth dryness and nose dryness.   Eyes: Negative for pain, visual disturbance and dryness.  Respiratory: Negative for cough, hemoptysis, shortness of breath and difficulty breathing.   Cardiovascular: Negative for chest pain, palpitations, hypertension and swelling in legs/feet.  Gastrointestinal: Negative for blood in stool, constipation and diarrhea.  Endocrine: Negative for increased urination.  Genitourinary: Negative for painful urination.  Musculoskeletal: Positive for arthralgias, joint pain, joint swelling, myalgias, morning stiffness and myalgias. Negative for muscle weakness and muscle tenderness.  Skin: Positive for rash. Negative for color change, pallor, hair loss, nodules/bumps, skin tightness, ulcers and sensitivity to sunlight.  Allergic/Immunologic: Negative for susceptible to infections.  Neurological: Negative for dizziness, numbness, headaches and weakness.  Hematological: Negative for swollen glands.  Psychiatric/Behavioral: Positive for depressed mood and sleep disturbance. The patient is not nervous/anxious.     PMFS History:  Patient Active Problem List   Diagnosis Date Noted  . History of depression 05/22/2017  . Primary osteoarthritis of both hands 07/22/2016  . DDD lumbar spine 07/22/2016  . Trigger finger 07/10/2016  . Psoriatic arthropathy ([place]) 07/10/2016  . High risk medication use 06/13/2016  . Primary osteoarthritis of both knees 06/13/2016  . History of gastric bypass 06/13/2016  . Vitamin D deficiency 06/13/2016  . Psoriasis 02/18/2013  . 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 Anxiety   . Arthritis   . GERD (gastroesophageal reflux disease)   . History of kidney stones   . Pelvic mass     Family History  Problem Relation Age of Onset  . Heart disease Father   . Arthritis Father   . Psoriasis Father   . Parkinsonism Father   . Other Father 82       pacemaker  . Colon cancer Paternal  Grandfather   . Heart failure Paternal Grandmother   . Stroke Maternal Grandmother   . Esophageal cancer Neg Hx   . Rectal cancer Neg Hx   . Stomach cancer Neg Hx    Past Surgical History:  Procedure Laterality Date  . ABDOMINAL HYSTERECTOMY    . APPENDECTOMY    . BACK SURGERY    . CHOLECYSTECTOMY    . EXPLORATORY LAPAROTOMY    . GASTRIC BYPASS    . KNEE ARTHROSCOPY    . LITHOTRIPSY     Social History   Social History Narrative   Daily caffeine      Objective: Vital Signs: BP 130/84 (BP Location: Left Arm, Patient Position: Sitting, Cuff Size: Large)   Pulse 86   Resp 16   Ht 5\' 6"  (1.676 m)   Wt 249 lb (112.9 kg)   BMI 40.19 kg/m    Physical Exam  Constitutional: She is oriented to person, place, and time. She appears well-developed and well-nourished.  HENT:  Head: Normocephalic and atraumatic.  Eyes: Conjunctivae and EOM are normal.  Neck: Normal range of motion.  Cardiovascular: Normal rate, regular rhythm, normal heart sounds and intact distal pulses.  Pulmonary/Chest: Effort normal and breath sounds normal.  Abdominal: Soft. Bowel sounds are normal.  Lymphadenopathy:    She has no cervical adenopathy.  Neurological: She is alert and oriented to person, place, and time.  Skin: Skin is warm and dry. Capillary refill takes less than 2 seconds.  Psychiatric: She has a normal mood and affect. Her behavior is normal.  Nursing note and vitals reviewed.    Musculoskeletal Exam: C-spine limited range of motion with lateral rotation to the left.  Thoracic and lumbar spine good range of motion.  No midline spinal tenderness.  No SI joint tenderness.  Shoulder joints, elbow joints, wrist joints, MCPs, PIPs, DIPs good range of motion with no synovitis.  She has PIP and DIP synovial thickening.  Hip joints good range of motion with some discomfort of her left hip joint.  She has limited flexion extension of the left knee joint.  She has warmth and a small effusion of her  left knee joint.  Baker's cyst palpable in the popliteal region of the left knee.  Right knee full range of motion with no discomfort.  Ankle joints, MTPs, PIPs, DIPs good range of motion with no synovitis.  No warmth or effusion of bilateral ankle joints.  She has no tenderness of bilateral trochanteric bursa.  CDAI Exam: CDAI Homunculus Exam:   Joint Counts:  CDAI Tender Joint count: 0 CDAI Swollen Joint count: 0  Global Assessments:  Patient Global Assessment: 6 Provider Global Assessment: 6  CDAI Calculated Score: 12    Investigation: No additional findings. CBC Latest Ref Rng & Units 08/01/2017 12/12/2016 09/24/2016  WBC 3.8 - 10.8 Thousand/uL 5.7 6.4 6.9  Hemoglobin 11.7 - 15.5 g/dL 11.3(L) 12.3 12.5  Hematocrit 35.0 - 45.0 % 35.2 37.8 37.8  Platelets 140 - 400 Thousand/uL 326 336 311   CMP Latest Ref  Rng & Units 08/01/2017 05/22/2017 12/12/2016  Glucose 65 - 99 mg/dL 87 - 85  BUN 7 - 25 mg/dL 19 - 14  Creatinine 0.50 - 0.99 mg/dL 0.76 - 0.66  Sodium 135 - 146 mmol/L 139 - 140  Potassium 3.5 - 5.3 mmol/L 4.4 - 4.8  Chloride 98 - 110 mmol/L 102 - 103  CO2 20 - 32 mmol/L 30 - 26  Calcium 8.6 - 10.4 mg/dL 9.8 - 9.1  Total Protein 6.1 - 8.1 g/dL 7.1 7.1 7.1  Total Bilirubin 0.2 - 1.2 mg/dL 0.3 - 0.3  Alkaline Phos 33 - 130 U/L - - 88  AST 10 - 35 U/L 17 - 22  ALT 6 - 29 U/L 20 - 21     Imaging: No results found.  Speciality Comments: No specialty comments available.    Procedures:  Large Joint Inj: L knee on 11/07/2017 9:41 AM Indications: pain Details: 27 G 1.5 in needle, medial approach  Arthrogram: No  Medications: 1.5 mL lidocaine 1 %; 60 mg triamcinolone acetonide 40 MG/ML Aspirate: 0 mL Outcome: tolerated well, no immediate complications Procedure, treatment alternatives, risks and benefits explained, specific risks discussed. Consent was given by the patient. Immediately prior to procedure a time out was called to verify the correct patient, procedure,  equipment, support staff and site/side marked as required. Patient was prepped and draped in the usual sterile fashion.     Allergies: Dilaudid [hydromorphone hcl] and Penicillins   Assessment / Plan:     Visit Diagnoses: Psoriatic arthropathy ([place]): She has no synovitis or dactylitis on exam.  She has no joint tenderness on exam.  No plantar fasciitis or achilles tendonitis.  No SI joint tenderness. Her psoriasis continues to clear, especially on bilateral extensor surface of the elbow joints. She has not missed any doses of Cosentyx.  She feels she continues to improve on Cosentyx.  She continues to have discomfort in bilateral hands, especially a week before her next Cosentyx injection.  She takes ibuprofen for breakthrough pain relief.  She will continue on this current treatment regimen.  She was advised to notify us if she develops a flare.  Psoriasis: She has a few patches of psoriasis on bilateral elbow joints.  Her psoriasis continues to improve since starting on Cosentyx.  She uses clobetasol topical cream as needed.  High risk medication use - Cosentyx  -CBC and CMP will be drawn today to monitor for drug toxicity.  She will return in September number 3 months for lab work.  Plan: CBC with Differential/Platelet, COMPLETE METABOLIC PANEL WITH GFR  Primary osteoarthritis of both knees: She has limited flexion extension of her left knee with warmth on exam.  She also has a Baker's cyst palpable popliteal region of the left knee joint.  She is been having increased discomfort in her left knee.  She had gel injections on 09/04/2017, 09/11/2017, 09/18/17, and 09/25/2017.  She had her last cortisone injection of the left knee was on 08/01/2017.  She feels that the gel injections helped her bilateral knee pain.  She requested a cortisone injection today performed today in the office of her left knee.  She tolerated the procedure well.  She was advised to ice and elevate her knee.  She should not fully  submerged her knee in the bathtub or pool for 3 days.  She was given a prescription for Pennsaid which she can apply topically twice daily.  Voltaren gel was not effective for her.  Primary osteoarthritis of  both hands: She has PIP and DIP synovial thickening consistent with osteoarthritis of bilateral hands.  She is no tenderness on exam.  No synovitis was noted.  Joint protection and muscle strengthening were discussed.  DDD lumbar spine: No midline spinal tenderness.  She is good range of motion.  She has no discomfort in her lower back at this time.  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 Large Joint Inj  . CBC with Differential/Platelet  . COMPLETE METABOLIC PANEL WITH GFR   Meds ordered this encounter  Medications  . Diclofenac Sodium 2 % SOLN    Sig: Apply 2 pumps to affected knee joints twice daily as needed    Dispense:  1 Bottle    Refill:  1    Face-to-face time spent with patient was 30 minutes. >50% of time was spent in counseling and coordination of care.  Follow-Up Instructions: Return in about 5 months (around 04/09/2018) for Psoriatic arthritis, Osteoarthritis.   Ofilia Neas, PA-C   I examined and evaluated the patient with Hazel Sams PA. The plan of care was discussed as noted above.  Bo Merino, MD  Note - This record has been created using Editor, commissioning.  Chart creation errors have been sought, but may not always  have been located. Such creation errors do not reflect on  the standard of medical care.

## 2017-11-08 LAB — CBC WITH DIFFERENTIAL/PLATELET
Basophils Absolute: 100 cells/uL (ref 0–200)
Basophils Relative: 1.3 %
EOS ABS: 300 {cells}/uL (ref 15–500)
EOS PCT: 3.9 %
HEMATOCRIT: 35.9 % (ref 35.0–45.0)
Hemoglobin: 11.6 g/dL — ABNORMAL LOW (ref 11.7–15.5)
LYMPHS ABS: 2333 {cells}/uL (ref 850–3900)
MCH: 24.3 pg — AB (ref 27.0–33.0)
MCHC: 32.3 g/dL (ref 32.0–36.0)
MCV: 75.3 fL — AB (ref 80.0–100.0)
MPV: 10.3 fL (ref 7.5–12.5)
Monocytes Relative: 12.3 %
NEUTROS PCT: 52.2 %
Neutro Abs: 4019 cells/uL (ref 1500–7800)
Platelets: 369 10*3/uL (ref 140–400)
RBC: 4.77 10*6/uL (ref 3.80–5.10)
RDW: 14.2 % (ref 11.0–15.0)
Total Lymphocyte: 30.3 %
WBC mixed population: 947 cells/uL (ref 200–950)
WBC: 7.7 10*3/uL (ref 3.8–10.8)

## 2017-11-08 LAB — COMPLETE METABOLIC PANEL WITH GFR
AG RATIO: 1.4 (calc) (ref 1.0–2.5)
ALT: 18 U/L (ref 6–29)
AST: 19 U/L (ref 10–35)
Albumin: 4.1 g/dL (ref 3.6–5.1)
Alkaline phosphatase (APISO): 87 U/L (ref 33–130)
BUN: 14 mg/dL (ref 7–25)
CALCIUM: 9.5 mg/dL (ref 8.6–10.4)
CHLORIDE: 103 mmol/L (ref 98–110)
CO2: 28 mmol/L (ref 20–32)
Creat: 0.6 mg/dL (ref 0.50–0.99)
GFR, Est African American: 113 mL/min/{1.73_m2} (ref >=60)
GFR, Est Non African American: 98 mL/min/{1.73_m2} (ref >=60)
GLUCOSE: 77 mg/dL (ref 65–99)
Globulin: 3 g/dL (calc) (ref 1.9–3.7)
POTASSIUM: 4.6 mmol/L (ref 3.5–5.3)
Sodium: 139 mmol/L (ref 135–146)
Total Bilirubin: 0.3 mg/dL (ref 0.2–1.2)
Total Protein: 7.1 g/dL (ref 6.1–8.1)

## 2017-11-10 NOTE — Progress Notes (Signed)
CBC stable. CMP WNL.

## 2017-11-25 ENCOUNTER — Telehealth: Payer: Self-pay | Admitting: Rheumatology

## 2017-11-25 NOTE — Telephone Encounter (Signed)
Attempted to contact Jeneen Rinks and left message for him to call the office.

## 2017-11-25 NOTE — Telephone Encounter (Signed)
Theodis Sato, Lexicographer for [employer] called to speak with you in regards to this patient.  CB#[phone].  Thank you.

## 2017-11-28 ENCOUNTER — Telehealth: Payer: Self-pay | Admitting: Rheumatology

## 2017-11-28 NOTE — Telephone Encounter (Signed)
Patient called stating she was due for her delivery of Cosentyx on Wednesday but was informed that it couldn't be delivered because Dr. Estanislado Pandy didn't file the correct appeal to the insurance company.  Patient was told she could stop by the office to pick up a sample (per Seth Bake).

## 2017-12-16 ENCOUNTER — Telehealth: Payer: Self-pay | Admitting: Rheumatology

## 2017-12-16 ENCOUNTER — Encounter: Payer: Self-pay | Admitting: *Deleted

## 2017-12-16 NOTE — Telephone Encounter (Signed)
Patient called checking on her appeal for her Cosentyx through [employer].  Patient is requesting a return call.

## 2017-12-16 NOTE — Telephone Encounter (Signed)
Patient advised the letter has been faxed. Left message with Theodis Sato with the [facility] that the appeal letter has been faxed to patient's insurance.

## 2017-12-22 NOTE — Telephone Encounter (Signed)
Received a call from [employer] from [place], he stated the insurance said they never received the appeal letter. He provided another fax number for Cassidy [phone]. And also requested it faxed to Cassidy Reeves- [phone]. Sent both faxes. Will update once we receive a response.  2:57 PM Beatriz Chancellor, CPhT

## 2017-12-26 ENCOUNTER — Telehealth: Payer: Self-pay | Admitting: Rheumatology

## 2017-12-26 NOTE — Telephone Encounter (Signed)
Medication Samples have been provided to the patient.  Drug name: cosentyx      Strength: 150mg         Qty: 2 pens  LOT: [id number]  Exp.Date: 05/2019  Dosing instructions: Inject 300mg  (2 pens) into the skin every 28 days.   The patient has been instructed regarding the correct time, dose, and frequency of taking this medication, including desired effects and most common side effects.   Cindie Rajagopalan C Kanyon Seibold 8:59 AM 12/26/2017
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
## 2018-01-02 NOTE — Telephone Encounter (Signed)
Faxed new Cosentyx to [employer]. Will send document to scan center.  3:44 PM Beatriz Chancellor, CPhT

## 2018-01-02 NOTE — Telephone Encounter (Signed)
Crystal from [employer] patient assistance called to request a new prescription for Cosentyx.   Please fax new prescription to the number below.  Phone # [phone] Fax # [phone]  2:58 PM Clytee Heinrich Delice Lesch, CPhT

## 2018-01-03 ENCOUNTER — Emergency Department (HOSPITAL_COMMUNITY)
Admission: EM | Admit: 2018-01-03 | Discharge: 2018-01-03 | Disposition: A | Discharge status: Home / Self Care | Payer: Managed Care, Other (non HMO) | Attending: Emergency Medicine | Admitting: Emergency Medicine

## 2018-01-03 ENCOUNTER — Other Ambulatory Visit: Payer: Self-pay

## 2018-01-03 ENCOUNTER — Emergency Department (HOSPITAL_COMMUNITY): Payer: Managed Care, Other (non HMO)

## 2018-01-03 ENCOUNTER — Encounter (HOSPITAL_COMMUNITY): Payer: Self-pay | Admitting: Emergency Medicine

## 2018-01-03 DIAGNOSIS — M25511 Pain in right shoulder: Secondary | ICD-10-CM | POA: Insufficient documentation

## 2018-01-03 DIAGNOSIS — Z79899 Other long term (current) drug therapy: Secondary | ICD-10-CM | POA: Insufficient documentation

## 2018-01-03 MED ORDER — NAPROXEN 250 MG PO TABS
500.0000 mg | ORAL_TABLET | Freq: Once | ORAL | Status: AC
  Administered 2018-01-03: 500 mg via ORAL
  Filled 2018-01-03: qty 2

## 2018-01-03 NOTE — Discharge Instructions (Addendum)
I have provided an arm sling for comfort.Alternate ibuprofen or tylenol for pain.Please follow up with PCP in 1 week for reevaluation of symptoms. If your is worsen or you experience any chest pain, shortness of breath, limited range of motion please return to the ED for reevaluation.
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
## 2018-01-03 NOTE — ED Triage Notes (Signed)
Pt reports she fell at the airport 2 hours P[place]. C/o right shoulder pain.

## 2018-01-03 NOTE — ED Notes (Signed)
Registration at bedside.

## 2018-01-03 NOTE — ED Notes (Signed)
Patient able to ambulate independently  

## 2018-01-21 ENCOUNTER — Telehealth: Payer: Self-pay | Admitting: Pharmacy Technician
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
## 2018-01-22 NOTE — Telephone Encounter (Signed)
Medication Samples have been provided to the patient.  Drug name: Cosentyx       Strength: 300mg         Qty:1 box(2 pens)  LOT: [id number]  Exp.Date:06/2019  Dosing instructions: inject 300mg  into the skin every 28 days.   The patient has been instructed regarding the correct time, dose, and frequency of taking this medication, including desired effects and most common side effects.   Darrin Apodaca C Marvette Schamp 11:40 AM 01/22/2018

## 2018-01-26 NOTE — Telephone Encounter (Signed)
Received a fax from [facility] regarding a prior authorization for Cosentyx 300mg . Authorization has been APPROVED from 01/14/18 to 01/15/19.   Will send document to scan center.  Authorization # [id number] Phone # [phone]  Called patient to advise of $50 copay and that she is eligible for a copay card, left message.   Please send prescription to [facility].  4:24 PM Beatriz Chancellor, CPhT

## 2018-01-27 MED ORDER — SECUKINUMAB 150 MG/ML ~~LOC~~ SOAJ: 300.0000 mg | SUBCUTANEOUS | 0 refills | Status: DC

## 2018-01-27 NOTE — Addendum Note (Signed)
Addended by: Carole Binning on: 01/27/2018 09:01 AM   Modules accepted: Orders

## 2018-01-27 NOTE — Telephone Encounter (Signed)
Prescription sent to the pharmacy.

## 2018-03-18 NOTE — Progress Notes (Signed)
Office Visit Note  Patient: Cassidy Reeves             Date of Birth: Oct 07, 1955           MRN: [id number]             PCP: Haywood Pao, MD Referring: Haywood Pao, MD Visit Date: 04/01/2018 Occupation: @GUAROCC @  Subjective:  Pain in knees.      History of Present Illness: Cassidy Reeves is a 62 y.o. female  with history of psoriatic arthritis and osteoarthritis.She overall feels she is doing well.  Started taking Cosentyx 300 mg monthly recently and feels like it does not last the entire month.  The week before her next injection she feels more joint pain and fatigue. Reports pain in her knees, hands, and feet.  Reports small rash on elbow.  She is also followed by a dermatologist. Recently had root canal and was given ibuprofen.  Noticed improvement in arthritis and now takes Ibuprofen 200 mg three times a day.Denies miss any doses.  Denies any new medical conditions, medications, or allergies.  She says she recently got her flu shot.  Activities of Daily Living:  Patient reports morning stiffness for 2 hours.   Patient Reports nocturnal pain.  Difficulty dressing/grooming: Denies Difficulty climbing stairs: Reports Difficulty getting out of chair: Reports Difficulty using hands for taps, buttons, cutlery, and/or writing: Reports  Review of Systems  Constitutional: Negative for fatigue, night sweats, weight gain and weight loss.  HENT: Negative for mouth sores, trouble swallowing, trouble swallowing, mouth dryness and nose dryness.   Eyes: Negative for pain, redness, itching, visual disturbance and dryness.  Respiratory: Negative for cough, shortness of breath, wheezing and difficulty breathing.   Cardiovascular: Negative for chest pain, palpitations, hypertension, irregular heartbeat and swelling in legs/feet.  Gastrointestinal: Negative for abdominal pain, blood in stool, constipation, diarrhea, nausea and vomiting.  Endocrine: Negative for increased urination.    Genitourinary: Negative for painful urination, nocturia, pelvic pain and vaginal dryness.  Musculoskeletal: Positive for arthralgias, joint pain, joint swelling and morning stiffness. Negative for myalgias, muscle weakness, muscle tenderness and myalgias.  Skin: Negative for color change, rash, hair loss, skin tightness, ulcers and sensitivity to sunlight.  Allergic/Immunologic: Negative for susceptible to infections.  Neurological: Negative for dizziness, light-headedness, headaches, memory loss, night sweats and weakness.  Hematological: Negative for bruising/bleeding tendency and swollen glands.  Psychiatric/Behavioral: Negative for depressed mood, confusion and sleep disturbance. The patient is not nervous/anxious.     PMFS History:  Patient Active Problem List   Diagnosis Date Noted  . History of depression 05/22/2017  . Primary osteoarthritis of both hands 07/22/2016  . DDD lumbar spine 07/22/2016  . Trigger finger 07/10/2016  . Psoriatic arthropathy ([facility]) 07/10/2016  . High risk medication use 06/13/2016  . Primary osteoarthritis of both knees 06/13/2016  . History of gastric bypass 06/13/2016  . Vitamin D deficiency 06/13/2016  . Psoriasis 02/18/2013  . 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 Anxiety   . Arthritis   . GERD (gastroesophageal reflux disease)   . History of kidney stones   . Pelvic mass     Family History  Problem Relation Age of Onset  . Heart disease Father   . Arthritis Father   . Psoriasis Father   . Parkinsonism Father   . Other Father 35       pacemaker  . Colon cancer Paternal Grandfather   .  Heart failure Paternal Grandmother   . Stroke Maternal Grandmother   . Esophageal cancer Neg Hx   . Rectal cancer Neg Hx   . Stomach cancer Neg Hx    Past Surgical History:  Procedure Laterality Date  . ABDOMINAL HYSTERECTOMY    . APPENDECTOMY    . BACK SURGERY    . CHOLECYSTECTOMY    . EXPLORATORY  LAPAROTOMY    . GASTRIC BYPASS    . KNEE ARTHROSCOPY    . LITHOTRIPSY     Social History   Social History Narrative   Daily caffeine     Objective: Vital Signs: BP (!) 144/81 (BP Location: Left Wrist, Patient Position: Sitting, Cuff Size: Normal)   Pulse 79   Resp 15   Ht 5\' 6"  (1.676 m)   Wt 247 lb (112 kg)   BMI 39.87 kg/m    Physical Exam  Constitutional: She is oriented to person, place, and time. She appears well-developed and well-nourished.  HENT:  Head: Normocephalic and atraumatic.  Eyes: Conjunctivae and EOM are normal.  Neck: Normal range of motion.  Cardiovascular: Normal rate, regular rhythm, normal heart sounds and intact distal pulses.  Pulmonary/Chest: Effort normal and breath sounds normal.  Abdominal: Soft. Bowel sounds are normal.  Lymphadenopathy:    She has no cervical adenopathy.  Neurological: She is alert and oriented to person, place, and time.  Skin: Skin is warm and dry. Capillary refill takes less than 2 seconds. Rash noted.  Psoriasis patch on elbows  Psychiatric: She has a normal mood and affect. Her behavior is normal.  Nursing note and vitals reviewed.    Musculoskeletal Exam: C-spine good range of motion.  She has thoracic kyphosis.  She has limited range of motion of lumbar spine without discomfort.  She had no SI joint tenderness.  Right shoulder joint had some discomfort due to recent fall.  Left shoulder joint was in good range of motion.  Elbow joints, wrist joints, MCPs, PIPs and DIPs been good range of motion.  She has DIP and PIP thickening due to underlying osteoarthritis.  Hip joints, knee joints, ankles MTPs PIPs were in good range of motion.  She has discomfort range of motion of bilateral knee joints without any warmth swelling or effusion.  CDAI Exam: CDAI Score: 2.8  Patient Global Assessment: 4 (mm); Provider Global Assessment: 4 (mm) Swollen: 0 ; Tender: 2  Joint Exam      Right  Left  Knee   Tender   Tender      Investigation: No additional findings.  Imaging: No results found.  Recent Labs: Lab Results  Component Value Date   WBC 7.7 11/07/2017   HGB 11.6 (L) 11/07/2017   PLT 369 11/07/2017   NA 139 11/07/2017   K 4.6 11/07/2017   CL 103 11/07/2017   CO2 28 11/07/2017   GLUCOSE 77 11/07/2017   BUN 14 11/07/2017   CREATININE 0.60 11/07/2017   BILITOT 0.3 11/07/2017   ALKPHOS 88 12/12/2016   AST 19 11/07/2017   ALT 18 11/07/2017   PROT 7.1 11/07/2017   ALBUMIN 4.3 12/12/2016   CALCIUM 9.5 11/07/2017   GFRAA 113 11/07/2017   QFTBGOLDPLUS NEGATIVE 08/01/2017    Speciality Comments: No specialty comments available.  Procedures:  No procedures performed Allergies: Dilaudid [hydromorphone hcl] and Penicillins   Assessment / Plan:     Visit Diagnoses: Psoriatic arthropathy (HCC)-patient had no excess active synovitis.  Although she continues to have some arthralgias and discomfort.  She  has been taking Cosentyx once a month.  She believes the pain increases prior to her next dose.  We discussed to spreading the dose every 2 weeks.  Psoriasis-she has few psoriasis patches on her elbows.  Topical steroid use was discussed.  High risk medication use - Current regimen includes Cosentyx 300 mg subq every 28 days.  Last TB gold negative on 08/01/17.  Most recent CBC/CMP stable on 11/07/17. Due for CBC/CMP today and then every 3 months.  Standing orders have been placed.  Recommend flu, Pneumovax 23, Prevnar 13, and Shingrix as indicated. Plan: CBC with Differential/Platelet, COMPLETE METABOLIC PANEL WITH GFR, CBC with Differential/Platelet, COMPLETE METABOLIC PANEL WITH GFR  Primary osteoarthritis of both knees-she continues to have pain and discomfort.  She had no synovitis on examination.  Primary osteoarthritis of both hands-she continues to have chronic discomfort in her hands.  DDD lumbar spine-she has chronic lower back pain which is tolerable.  Vitamin D deficiency-she is on  vitamin D supplement.  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 Need to monitor blood pressure, cholesterol, and to exercise 30-60 minutes on daily basis was discussed.   Orders: Orders Placed This Encounter  Procedures  . CBC with Differential/Platelet  . COMPLETE METABOLIC PANEL WITH GFR  . CBC with Differential/Platelet  . COMPLETE METABOLIC PANEL WITH GFR   No orders of the defined types were placed in this encounter.   .  Follow-Up Instructions: Return in about 5 months (around 08/31/2018) for Psoriatic arthritis, Osteoarthritis.   Bo Merino, MD  Note - This record has been created using Editor, commissioning.  Chart creation errors have been sought, but may not always  have been located. Such creation errors do not reflect on  the standard of medical care.

## 2018-04-01 ENCOUNTER — Encounter: Payer: Self-pay | Admitting: Rheumatology

## 2018-04-01 ENCOUNTER — Ambulatory Visit: Payer: Managed Care, Other (non HMO) | Admitting: Rheumatology

## 2018-04-01 VITALS — BP 144/81 | HR 79 | Resp 15 | Ht 66.0 in | Wt 247.0 lb

## 2018-04-01 DIAGNOSIS — Z79899 Other long term (current) drug therapy: Secondary | ICD-10-CM | POA: Diagnosis not present

## 2018-04-01 DIAGNOSIS — L4050 Arthropathic psoriasis, unspecified: Secondary | ICD-10-CM

## 2018-04-01 DIAGNOSIS — M170 Bilateral primary osteoarthritis of knee: Secondary | ICD-10-CM

## 2018-04-01 DIAGNOSIS — Z8719 Personal history of other diseases of the digestive system: Secondary | ICD-10-CM

## 2018-04-01 DIAGNOSIS — L409 Psoriasis, unspecified: Secondary | ICD-10-CM | POA: Diagnosis not present

## 2018-04-01 DIAGNOSIS — M47816 Spondylosis without myelopathy or radiculopathy, lumbar region: Secondary | ICD-10-CM

## 2018-04-01 DIAGNOSIS — Z8659 Personal history of other mental and behavioral disorders: Secondary | ICD-10-CM

## 2018-04-01 DIAGNOSIS — Z9884 Bariatric surgery status: Secondary | ICD-10-CM

## 2018-04-01 DIAGNOSIS — M19042 Primary osteoarthritis, left hand: Secondary | ICD-10-CM

## 2018-04-01 DIAGNOSIS — E559 Vitamin D deficiency, unspecified: Secondary | ICD-10-CM

## 2018-04-01 DIAGNOSIS — M19041 Primary osteoarthritis, right hand: Secondary | ICD-10-CM

## 2018-04-01 LAB — COMPLETE METABOLIC PANEL WITH GFR
AG RATIO: 1.3 (calc) (ref 1.0–2.5)
ALBUMIN MSPROF: 4.1 g/dL (ref 3.6–5.1)
ALKALINE PHOSPHATASE (APISO): 93 U/L (ref 33–130)
ALT: 20 U/L (ref 6–29)
AST: 24 U/L (ref 10–35)
BILIRUBIN TOTAL: 0.2 mg/dL (ref 0.2–1.2)
BUN: 15 mg/dL (ref 7–25)
CHLORIDE: 105 mmol/L (ref 98–110)
CO2: 29 mmol/L (ref 20–32)
Calcium: 9.4 mg/dL (ref 8.6–10.4)
Creat: 0.68 mg/dL (ref 0.50–0.99)
GFR, Est African American: 108 mL/min/{1.73_m2} (ref >=60)
GFR, Est Non African American: 93 mL/min/{1.73_m2} (ref >=60)
GLOBULIN: 3.1 g/dL (ref 1.9–3.7)
Glucose, Bld: 70 mg/dL (ref 65–99)
POTASSIUM: 4.4 mmol/L (ref 3.5–5.3)
SODIUM: 140 mmol/L (ref 135–146)
Total Protein: 7.2 g/dL (ref 6.1–8.1)

## 2018-04-01 LAB — CBC WITH DIFFERENTIAL/PLATELET
BASOS ABS: 113 {cells}/uL (ref 0–200)
Basophils Relative: 1.3 %
EOS ABS: 331 {cells}/uL (ref 15–500)
Eosinophils Relative: 3.8 %
HEMATOCRIT: 33.9 % — AB (ref 35.0–45.0)
HEMOGLOBIN: 11 g/dL — AB (ref 11.7–15.5)
Lymphs Abs: 2993 cells/uL (ref 850–3900)
MCH: 24 pg — AB (ref 27.0–33.0)
MCHC: 32.4 g/dL (ref 32.0–36.0)
MCV: 73.9 fL — AB (ref 80.0–100.0)
MPV: 11 fL (ref 7.5–12.5)
Monocytes Relative: 12.8 %
NEUTROS ABS: 4150 {cells}/uL (ref 1500–7800)
Neutrophils Relative %: 47.7 %
Platelets: 383 10*3/uL (ref 140–400)
RBC: 4.59 10*6/uL (ref 3.80–5.10)
RDW: 14.1 % (ref 11.0–15.0)
TOTAL LYMPHOCYTE: 34.4 %
WBC: 8.7 10*3/uL (ref 3.8–10.8)
WBCMIX: 1114 {cells}/uL — AB (ref 200–950)

## 2018-04-01 NOTE — Patient Instructions (Addendum)
Standing Labs We placed an order today for your standing lab work.    Please come back and get your standing labs in February and then every 3 months.  We have open lab Monday through Friday from 8:30-11:30 AM and 1:30-4:00 PM  at the office of Dr. Bo Merino.   You may experience shorter wait times on Monday and Friday afternoons. The office is located at [address] [place], [place], [place] [address] No appointment is necessary.   Labs are drawn by [facility].  You may receive a bill from [facility] for your lab work. If you have any questions regarding directions or hours of operation,  please call [phone].   Just as a reminder please drink plenty of water prior to coming for your lab work. Thanks!  Vaccines You are taking a medication(s) that can suppress your immune system.  The following immunizations are recommended: . Flu annually . Pneumonia (Pneumovax 63 and Prevnar 13 after age 48) . Shingrix  Please check with your PCP to make sure you are up to date.  Association of heart disease with psoriatic arthritis was discussed. Need to monitor blood pressure, cholesterol, and to exercise 30-60 minutes on daily basis was discussed.

## 2018-04-02 NOTE — Progress Notes (Signed)
Labs are stable.

## 2018-04-06 ENCOUNTER — Ambulatory Visit: Payer: Managed Care, Other (non HMO) | Admitting: Rheumatology

## 2018-04-08 ENCOUNTER — Ambulatory Visit: Payer: Managed Care, Other (non HMO) | Admitting: Physician Assistant

## 2018-04-08 ENCOUNTER — Telehealth: Payer: Self-pay | Admitting: Pharmacy Technician

## 2018-04-08 NOTE — Telephone Encounter (Signed)
Received fax notification from [facility], that its patients have been transitioned to [facility]. [facility] is now the patient's specialty pharmacy.  Patient was mailed letter to advise.

## 2018-05-05 ENCOUNTER — Other Ambulatory Visit: Payer: Self-pay | Admitting: *Deleted

## 2018-05-05 MED ORDER — SECUKINUMAB 150 MG/ML ~~LOC~~ SOAJ: 300.0000 mg | SUBCUTANEOUS | 0 refills | Status: DC

## 2018-05-05 NOTE — Telephone Encounter (Signed)
Refill request received via fax  Last Visit: 04/01/18 Next Visit: 09/01/18 Labs: 04/01/18 Stable TB Gold: 08/01/17 Neg   Okay to refill per Dr. Estanislado Pandy

## 2018-07-12 IMAGING — MR MR KNEE*L* W/O CM
4 of 5 series · 19 of 40 positions shown · non-contrast
Comparison: None.

CLINICAL DATA: Chronic left knee pain for 8 months.

EXAM:
MRI OF THE LEFT KNEE WITHOUT CONTRAST
TECHNIQUE: Multiplanar, multisequence MR imaging of the knee was performed. No
intravenous contrast was administered.

[Series 3: PD fat-sat · axial · 4.0mm · 0.27mm/px · z∈[-80,+35]mm · 6 of 24 slices shown (1 of 3)]
[im 1/24]
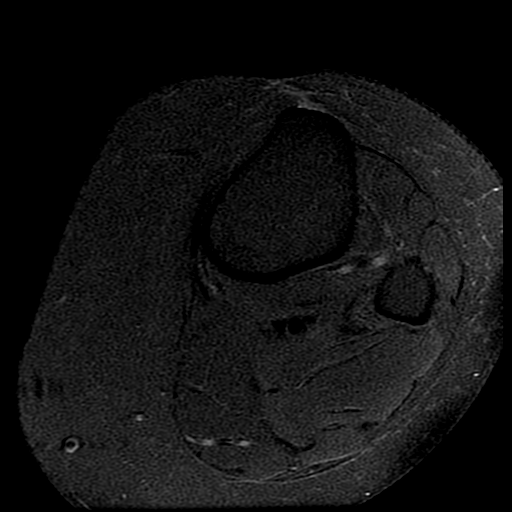
[im 5/24]
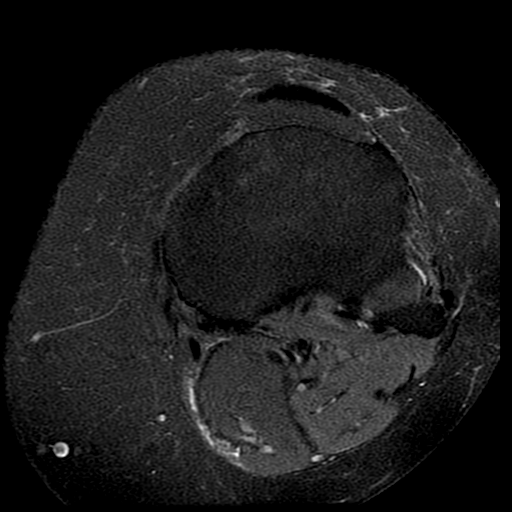
[im 10/24]
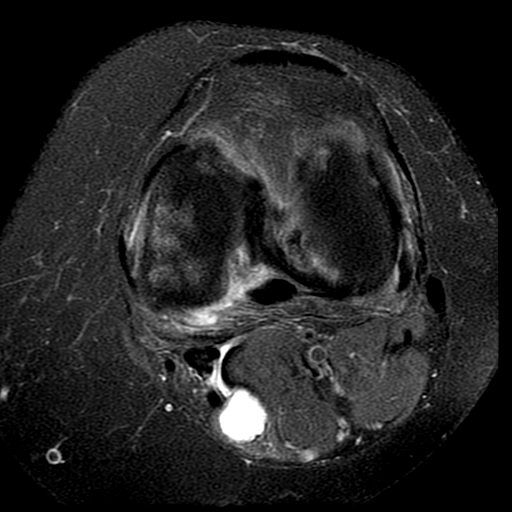
[im 14/24]
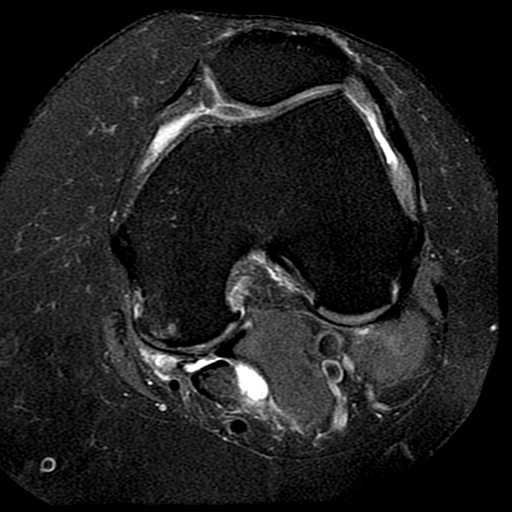
[im 19/24]
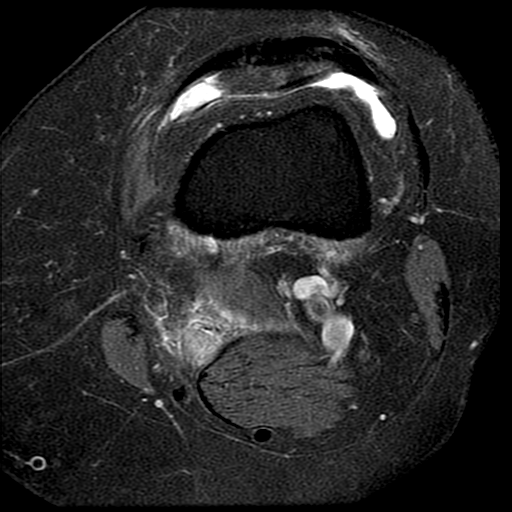
[im 24/24]
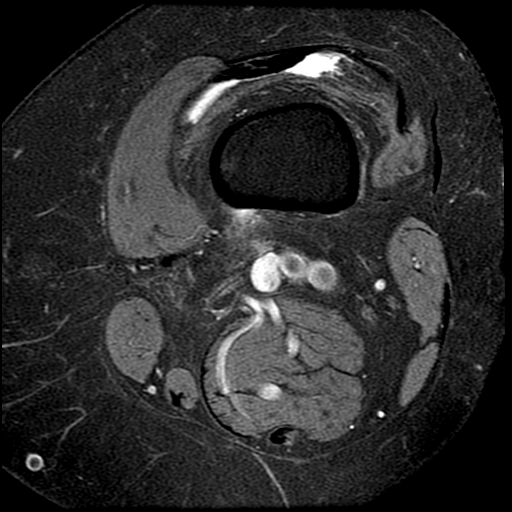

[Series 5: PD fat-sat · coronal · 4.0mm · 0.31mm/px · 7 of 32 slices shown (2 of 3)]
[im 1/32]
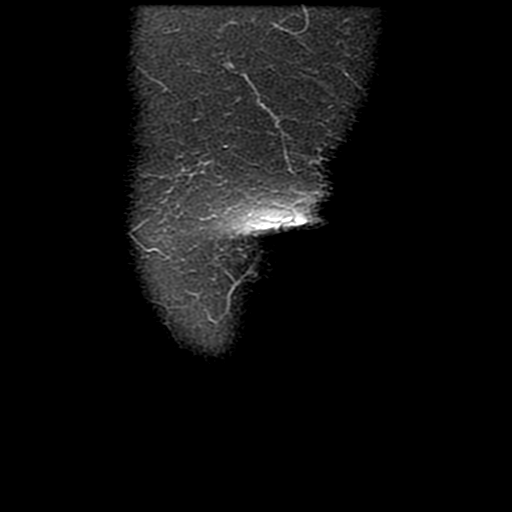
[im 4/32]
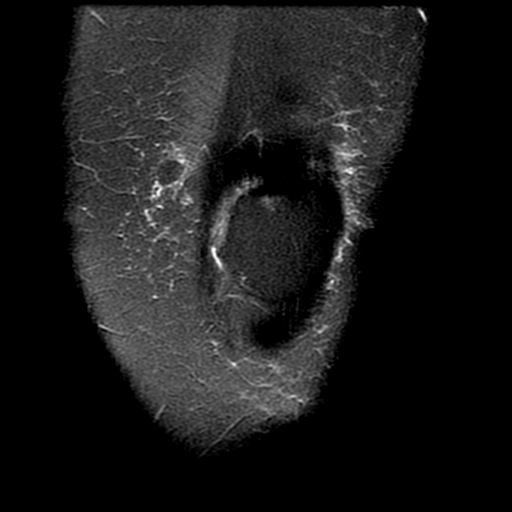
[im 8/32]
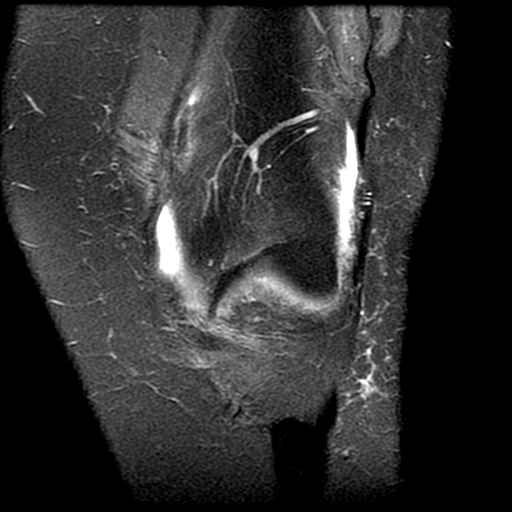
[im 12/32]
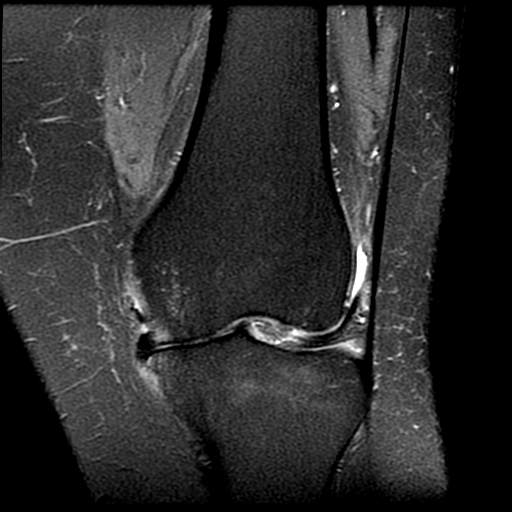
[im 16/32]
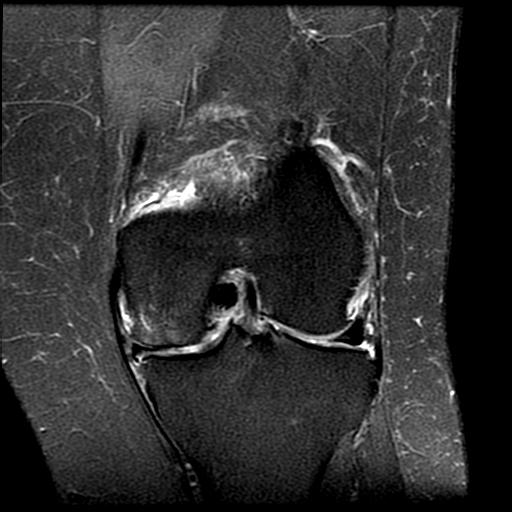
[im 20/32]
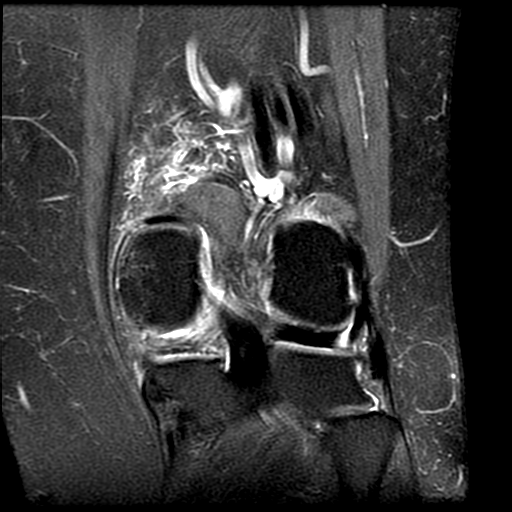
[im 28/32]
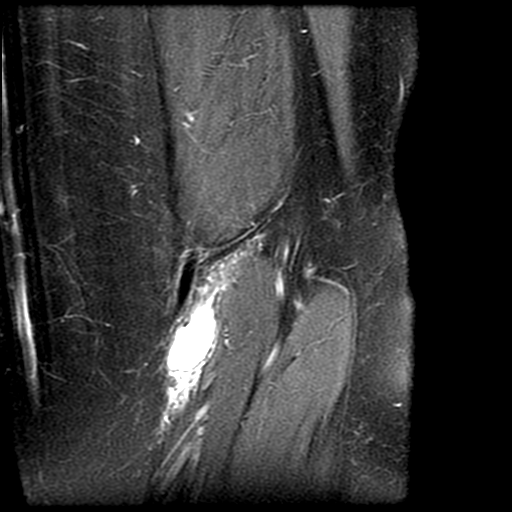

[Series 6: T2 fat-sat · coronal · 4.0mm · 0.31mm/px · 3 of 32 slices shown]
[im 4/32]
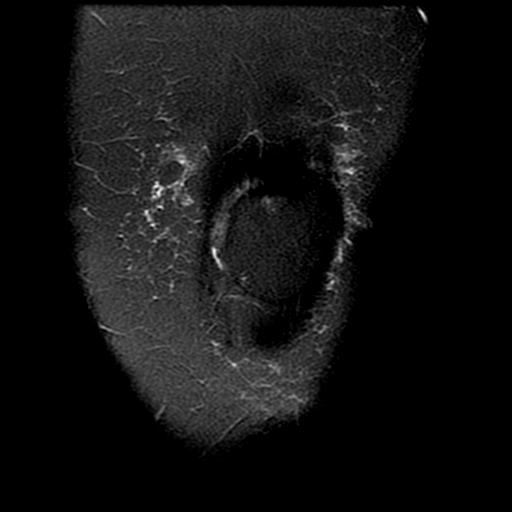
[im 16/32]
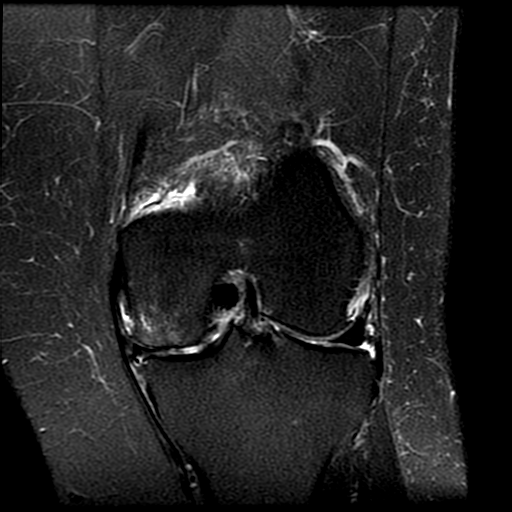
[im 28/32]
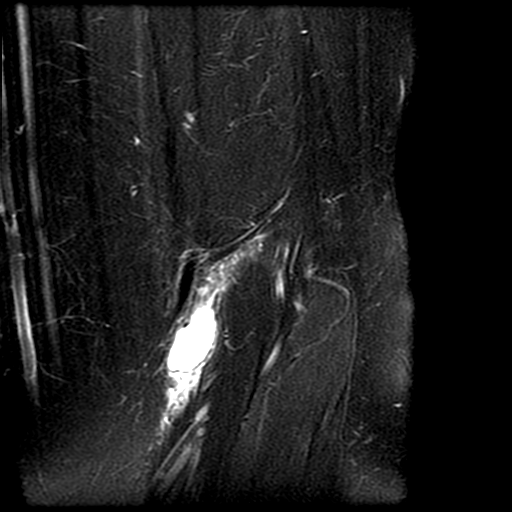

[Series 7: PD fat-sat · sagittal · 4.0mm · 0.31mm/px · 3 of 27 slices shown (3 of 3)]
[im 5/27]
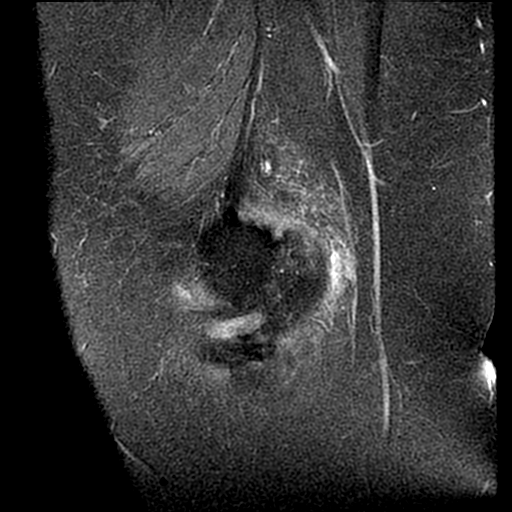
[im 14/27]
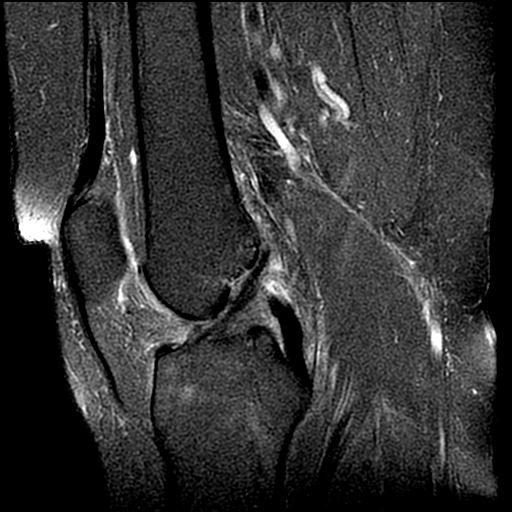
[im 22/27]
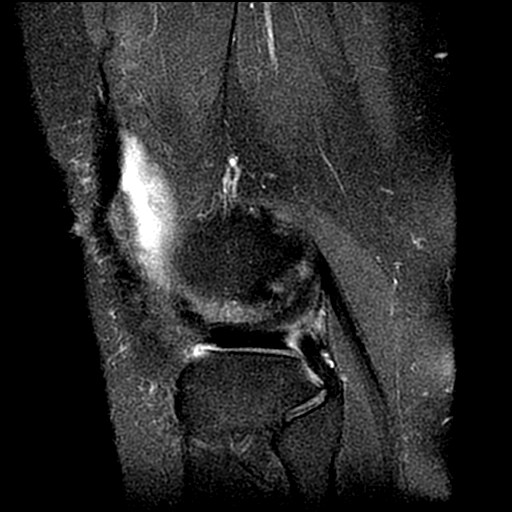

[19 of 40 positions shown; findings below may reference images not displayed]

FINDINGS: MENISCI

Medial meniscus: Free edge and inferior articular surface flap type
tear with a small flipped meniscal fragment in the medial gutter.

Lateral meniscus:  Intact

LIGAMENTS

Cruciates:  Intact

Collaterals:  Intact

CARTILAGE

Patellofemoral: Advanced degenerative chondrosis. Areas of grade 3
and grade 4 chondromalacia.

Medial: Advanced degenerative chondrosis with areas of
full-thickness cartilage loss, joint space narrowing, osteophytic
spurring, subchondral cystic change and subchondral edema.

Lateral: Moderate to advanced degenerative chondrosis with joint
space narrowing and spurring.

Joint: Small joint effusion. Superior and medial patellar plica are
noted.

Popliteal Fossa:  Small leaking Baker's cyst.

Extensor Mechanism: The patella retinacular structures are intact
and the quadriceps and patellar tendons are intact.

Bones:  No acute bony findings.

Other: Normal knee musculature.
IMPRESSION: 1. Flap type tear involving the midbody region of the medial
meniscus.
2. Intact ligamentous structures and no acute bony findings.
3. Tricompartmental degenerative changes most significant in the
medial compartment.
4. Small joint effusion and small leaking Baker's cyst.

## 2018-08-03 ENCOUNTER — Other Ambulatory Visit: Payer: Self-pay | Admitting: *Deleted

## 2018-08-03 MED ORDER — SECUKINUMAB 150 MG/ML ~~LOC~~ SOAJ: 300.0000 mg | SUBCUTANEOUS | 0 refills | Status: DC

## 2018-08-03 NOTE — Telephone Encounter (Signed)
Last Visit: 04/02/19 Next Visit: 09/01/18 Labs: 04/01/18 stable TB Gold: 08/01/17 Neg   Left message to advise patient she is due to update lab results.   Okay to refill 30 day supply per Dr. Estanislado Pandy

## 2018-08-14 ENCOUNTER — Telehealth: Payer: Self-pay | Admitting: Rheumatology

## 2018-08-14 DIAGNOSIS — Z79899 Other long term (current) drug therapy: Secondary | ICD-10-CM

## 2018-08-14 NOTE — Telephone Encounter (Signed)
Patient states she was in [place] working in the community and was potentially exposed to COVID-19 so she was placed on home quarantine. I then Spoke with Hazel Sams and patient should hold medication until her quarantine is over and then get labs checked and medication can be refilled. Patient verbalized understanding and will call the office when her quarantine is over to see what our lab hours are that week (or she will go to [facility]).

## 2018-08-14 NOTE — Telephone Encounter (Signed)
Patient left voicemail stating she was returning your call regarding needing labs before prescription of Cosentyx could be sent to the pharmacy.  Patient requested a return call to let you know "the reason why she can't come in."

## 2018-08-20 ENCOUNTER — Other Ambulatory Visit: Payer: Self-pay | Admitting: *Deleted

## 2018-08-20 ENCOUNTER — Telehealth: Payer: Self-pay | Admitting: Rheumatology

## 2018-08-20 DIAGNOSIS — Z79899 Other long term (current) drug therapy: Secondary | ICD-10-CM

## 2018-08-20 DIAGNOSIS — Z9225 Personal history of immunosupression therapy: Secondary | ICD-10-CM

## 2018-08-20 MED ORDER — SECUKINUMAB 150 MG/ML ~~LOC~~ SOAJ: 300.0000 mg | SUBCUTANEOUS | 0 refills | Status: DC

## 2018-08-20 NOTE — Addendum Note (Signed)
Addended by: Carole Binning on: 08/20/2018 10:15 AM   Modules accepted: Orders

## 2018-08-20 NOTE — Telephone Encounter (Signed)
Labs Orders released.   Last Visit: 04/02/19 Next Visit: 09/01/18 Labs: 04/01/18 stable TB Gold: 08/01/17 Neg  Patient updating labs today.   Okay to refill per Dr. Estanislado Pandy

## 2018-08-20 NOTE — Telephone Encounter (Signed)
Patient called requesting her labwork orders be sent to [facility] on [address].  Patient states she will be going this afternoon.  Patient states she is out of Cosentyx and is requesting a refill be sent to [facility].

## 2018-08-21 NOTE — Progress Notes (Signed)
CBC is normal.  Glucose is elevated.  Most likely it was not a fasting lab.  Please notify patient.

## 2018-08-22 LAB — COMPLETE METABOLIC PANEL WITH GFR
AG Ratio: 1.5 (calc) (ref 1.0–2.5)
ALT: 17 U/L (ref 6–29)
AST: 18 U/L (ref 10–35)
Albumin: 4.3 g/dL (ref 3.6–5.1)
Alkaline phosphatase (APISO): 94 U/L (ref 37–153)
BUN: 15 mg/dL (ref 7–25)
CO2: 26 mmol/L (ref 20–32)
Calcium: 9.5 mg/dL (ref 8.6–10.4)
Chloride: 104 mmol/L (ref 98–110)
Creat: 0.69 mg/dL (ref 0.50–0.99)
GFR, Est African American: 107 mL/min/{1.73_m2} (ref >=60)
GFR, Est Non African American: 93 mL/min/{1.73_m2} (ref >=60)
Globulin: 2.9 g/dL (calc) (ref 1.9–3.7)
Glucose, Bld: 161 mg/dL — ABNORMAL HIGH (ref 65–139)
Potassium: 4.2 mmol/L (ref 3.5–5.3)
Sodium: 139 mmol/L (ref 135–146)
Total Bilirubin: 0.2 mg/dL (ref 0.2–1.2)
Total Protein: 7.2 g/dL (ref 6.1–8.1)

## 2018-08-22 LAB — CBC WITH DIFFERENTIAL/PLATELET
Absolute Monocytes: 610 cells/uL (ref 200–950)
Basophils Absolute: 87 cells/uL (ref 0–200)
Basophils Relative: 1.3 %
Eosinophils Absolute: 288 cells/uL (ref 15–500)
Eosinophils Relative: 4.3 %
HCT: 35.9 % (ref 35.0–45.0)
Hemoglobin: 11.4 g/dL — ABNORMAL LOW (ref 11.7–15.5)
Lymphs Abs: 2559 cells/uL (ref 850–3900)
MCH: 23.8 pg — ABNORMAL LOW (ref 27.0–33.0)
MCHC: 31.8 g/dL — ABNORMAL LOW (ref 32.0–36.0)
MCV: 74.8 fL — ABNORMAL LOW (ref 80.0–100.0)
MPV: 10.8 fL (ref 7.5–12.5)
Monocytes Relative: 9.1 %
Neutro Abs: 3156 cells/uL (ref 1500–7800)
Neutrophils Relative %: 47.1 %
Platelets: 388 10*3/uL (ref 140–400)
RBC: 4.8 10*6/uL (ref 3.80–5.10)
RDW: 14.5 % (ref 11.0–15.0)
Total Lymphocyte: 38.2 %
WBC: 6.7 10*3/uL (ref 3.8–10.8)

## 2018-08-22 LAB — QUANTIFERON-TB GOLD PLUS
Mitogen-NIL: >10 IU/mL
NIL: 0.06 IU/mL
QuantiFERON-TB Gold Plus: NEGATIVE
TB1-NIL: 0.01 IU/mL
TB2-NIL: 0 IU/mL

## 2018-08-24 NOTE — Progress Notes (Signed)
TB negative.

## 2018-08-24 NOTE — Progress Notes (Signed)
Virtual Visit via Telephone Note  I connected with Cassidy Reeves on 08/24/18 at  9:45 AM EDT by telephone and verified that I am speaking with the correct person using two identifiers.   I discussed the limitations, risks, security and privacy concerns of performing an evaluation and management service by telephone and the availability of in person appointments. I also discussed with the patient that there may be a patient responsible charge related to this service. The patient expressed understanding and agreed to proceed.  CC: Intermittent left knee joint pain  History of Present Illness: Patient is a 63 year old female with a past medical history of psoriatic arthritis and osteoarthritis.  She is on cosentyx 300 mg sq injections every 28 days.  She tried splitting the dose to 150 mg every 14 days but had a breakout of psoriasis.  Since resuming cosentyx 300 mg sq every month her psoriasis has cleared.  She has no joint pain or joint swelling at this time.  She continues to have morning stiffness in both hands.  She has no achilles tendonitis or plantar fasciitis.  She has intermittent left knee joint pain but no joint swelling. She uses pennsaid topically PRN for pain relief.       Review of Systems  Constitutional: Negative for fever and malaise/fatigue.  Eyes: Negative for photophobia, pain, discharge and redness.  Respiratory: Negative for cough, shortness of breath and wheezing.   Cardiovascular: Negative for chest pain and palpitations.  Gastrointestinal: Negative for blood in stool, constipation and diarrhea.  Genitourinary: Negative for dysuria.  Musculoskeletal: Negative for back pain, joint pain, myalgias and neck pain.       +Morning stiffness   Skin: Negative for rash.  Neurological: Negative for dizziness and headaches.  Psychiatric/Behavioral: Negative for depression. The patient is not nervous/anxious and does not have insomnia.    Observations/Objective:  Physical Exam   Constitutional: She is oriented to person, place, and time.  Neurological: She is alert and oriented to person, place, and time.  Psychiatric: Mood, memory, affect and judgment normal.   Patient reports morning stiffness for 30 minutes.   Patient denies nocturnal pain.  Difficulty dressing/grooming: Denies Difficulty climbing stairs: Reports Difficulty getting out of chair: Denies Difficulty using hands for taps, buttons, cutlery, and/or writing: Denies  Assessment and Plan: Psoriatic arthropathy (HCC)-She has not had any recent flares.  She has no joint pain or joint swelling at time.  She feels she has been feeling better than she has in months.  She has no achilles tendonitis or plantar fasciitis.  She has no SI joint pain at this time. She tried spacing Cosentyx to 150 mg every 14 days but noticed her psoriasis worsened so she resumed injecting Cosentyx 300 mg sq once monthly.  Her psoriasis has cleared. She is no longer having nocturnal pain.  She continues to experience stiffness in both hands but she has no joint pain or joint swelling at this time.  She will continue on this current treatment regimen. A refill of pennsaid will be sent to the pharmacy.  She was advised to notify us if she develops increased joint pain or joint swelling. She will follow up in 3 months.   Psoriasis-She tried spacing Cosentyx 150 mg every 14 days but noticed her psoriasis worsened so she resumed injecting Cosentyx 300 mg sq once monthly.  Her psoriasis is back under control with this regimen.  She continues to follow up with Dr. Ubaldo Glassing.  High risk medication use -  Current regimen includes Cosentyx 300 mg subq every 28 days.  08/20/18: TB gold negative. CBC and CMP were drawn on 08/20/18.  She will be due for updated lab work in July and every 3 months.  Standing orders are in place.  Future order for vitamin D will be placed today.  If she develops any infections she was advised to hold her dose of Cosentyx until  the infection resolves.  Primary osteoarthritis of both knees-She has intermittent left knee joint pain s/p left knee arthroscopy.  She uses pennsaid topically PRN.  A refill was sent to the pharmacy.    Primary osteoarthritis of both hands-She experiences stiffness in both hands in the morning. She denies any joint pain or joint swelling at this time.   DDD lumbar spine-She has intermittent lower back pain.  She takes robaxin 500 mg TID PRN for muscle spasms.   Vitamin D deficiency-she is on vitamin D supplement. Future lab order for vitamin D will be placed today.   Follow Up Instructions: She will follow up in 3 months. She is due for lab work in July and every 3 months.  Standing orders are in place. Future order for Vitamin D was placed.  Refill of pennsaid will be sent to the pharmacy.    I discussed the assessment and treatment plan with the patient. The patient was provided an opportunity to ask questions and all were answered. The patient agreed with the plan and demonstrated an understanding of the instructions.   The patient was advised to call back or seek an in-person evaluation if the symptoms worsen or if the condition fails to improve as anticipated.  I provided 22 minutes of non-face-to-face time during this encounter.  Bo Merino, MD  Scribed by- Ofilia Neas, PA-C

## 2018-09-01 ENCOUNTER — Telehealth (INDEPENDENT_AMBULATORY_CARE_PROVIDER_SITE_OTHER): Payer: Managed Care, Other (non HMO) | Admitting: Rheumatology

## 2018-09-01 ENCOUNTER — Encounter: Payer: Self-pay | Admitting: Rheumatology

## 2018-09-01 DIAGNOSIS — Z79899 Other long term (current) drug therapy: Secondary | ICD-10-CM

## 2018-09-01 DIAGNOSIS — M19042 Primary osteoarthritis, left hand: Secondary | ICD-10-CM

## 2018-09-01 DIAGNOSIS — M19041 Primary osteoarthritis, right hand: Secondary | ICD-10-CM

## 2018-09-01 DIAGNOSIS — M47816 Spondylosis without myelopathy or radiculopathy, lumbar region: Secondary | ICD-10-CM

## 2018-09-01 DIAGNOSIS — E559 Vitamin D deficiency, unspecified: Secondary | ICD-10-CM

## 2018-09-01 DIAGNOSIS — Z9884 Bariatric surgery status: Secondary | ICD-10-CM

## 2018-09-01 DIAGNOSIS — M170 Bilateral primary osteoarthritis of knee: Secondary | ICD-10-CM

## 2018-09-01 DIAGNOSIS — L4050 Arthropathic psoriasis, unspecified: Secondary | ICD-10-CM

## 2018-09-01 DIAGNOSIS — L409 Psoriasis, unspecified: Secondary | ICD-10-CM | POA: Diagnosis not present

## 2018-09-01 MED ORDER — DICLOFENAC SODIUM 2 % TD SOLN: TRANSDERMAL | 1 refills | Status: DC

## 2018-11-09 ENCOUNTER — Encounter (HOSPITAL_COMMUNITY): Payer: Self-pay | Admitting: Emergency Medicine

## 2018-11-09 ENCOUNTER — Other Ambulatory Visit: Payer: Self-pay

## 2018-11-09 ENCOUNTER — Inpatient Hospital Stay (HOSPITAL_COMMUNITY)
Admission: EM | Admit: 2018-11-09 | Discharge: 2018-11-12 | DRG: 690 | Disposition: A | Discharge status: Home / Self Care | Payer: Managed Care, Other (non HMO) | Attending: Internal Medicine | Admitting: Internal Medicine

## 2018-11-09 ENCOUNTER — Emergency Department (HOSPITAL_COMMUNITY): Payer: Managed Care, Other (non HMO)

## 2018-11-09 DIAGNOSIS — L4050 Arthropathic psoriasis, unspecified: Secondary | ICD-10-CM | POA: Diagnosis present

## 2018-11-09 DIAGNOSIS — D84821 Immunodeficiency due to drugs: Secondary | ICD-10-CM

## 2018-11-09 DIAGNOSIS — N390 Urinary tract infection, site not specified: Secondary | ICD-10-CM

## 2018-11-09 DIAGNOSIS — B9620 Unspecified Escherichia coli [E. coli] as the cause of diseases classified elsewhere: Secondary | ICD-10-CM | POA: Diagnosis present

## 2018-11-09 DIAGNOSIS — Z90710 Acquired absence of both cervix and uterus: Secondary | ICD-10-CM

## 2018-11-09 DIAGNOSIS — Z880 Allergy status to penicillin: Secondary | ICD-10-CM

## 2018-11-09 DIAGNOSIS — M5136 Other intervertebral disc degeneration, lumbar region: Secondary | ICD-10-CM | POA: Diagnosis present

## 2018-11-09 DIAGNOSIS — Z20828 Contact with and (suspected) exposure to other viral communicable diseases: Secondary | ICD-10-CM | POA: Diagnosis present

## 2018-11-09 DIAGNOSIS — R402362 Coma scale, best motor response, obeys commands, at arrival to emergency department: Secondary | ICD-10-CM | POA: Diagnosis present

## 2018-11-09 DIAGNOSIS — R402142 Coma scale, eyes open, spontaneous, at arrival to emergency department: Secondary | ICD-10-CM | POA: Diagnosis present

## 2018-11-09 DIAGNOSIS — N3000 Acute cystitis without hematuria: Principal | ICD-10-CM | POA: Diagnosis present

## 2018-11-09 DIAGNOSIS — Z9884 Bariatric surgery status: Secondary | ICD-10-CM

## 2018-11-09 DIAGNOSIS — Z79899 Other long term (current) drug therapy: Secondary | ICD-10-CM

## 2018-11-09 DIAGNOSIS — Z6837 Body mass index (BMI) 37.0-37.9, adult: Secondary | ICD-10-CM

## 2018-11-09 DIAGNOSIS — I959 Hypotension, unspecified: Secondary | ICD-10-CM | POA: Diagnosis present

## 2018-11-09 DIAGNOSIS — R402252 Coma scale, best verbal response, oriented, at arrival to emergency department: Secondary | ICD-10-CM | POA: Diagnosis present

## 2018-11-09 DIAGNOSIS — R9431 Abnormal electrocardiogram [ECG] [EKG]: Secondary | ICD-10-CM | POA: Diagnosis present

## 2018-11-09 DIAGNOSIS — Z79890 Hormone replacement therapy (postmenopausal): Secondary | ICD-10-CM

## 2018-11-09 DIAGNOSIS — E669 Obesity, unspecified: Secondary | ICD-10-CM | POA: Diagnosis present

## 2018-11-09 DIAGNOSIS — Z87442 Personal history of urinary calculi: Secondary | ICD-10-CM

## 2018-11-09 DIAGNOSIS — Z1611 Resistance to penicillins: Secondary | ICD-10-CM | POA: Diagnosis present

## 2018-11-09 DIAGNOSIS — M791 Myalgia, unspecified site: Secondary | ICD-10-CM

## 2018-11-09 DIAGNOSIS — Z9049 Acquired absence of other specified parts of digestive tract: Secondary | ICD-10-CM

## 2018-11-09 DIAGNOSIS — F419 Anxiety disorder, unspecified: Secondary | ICD-10-CM | POA: Diagnosis present

## 2018-11-09 DIAGNOSIS — R531 Weakness: Secondary | ICD-10-CM | POA: Diagnosis not present

## 2018-11-09 DIAGNOSIS — M19042 Primary osteoarthritis, left hand: Secondary | ICD-10-CM | POA: Diagnosis present

## 2018-11-09 DIAGNOSIS — M19041 Primary osteoarthritis, right hand: Secondary | ICD-10-CM | POA: Diagnosis present

## 2018-11-09 DIAGNOSIS — Z1629 Resistance to other single specified antibiotic: Secondary | ICD-10-CM | POA: Diagnosis present

## 2018-11-09 DIAGNOSIS — E559 Vitamin D deficiency, unspecified: Secondary | ICD-10-CM | POA: Diagnosis present

## 2018-11-09 DIAGNOSIS — R509 Fever, unspecified: Secondary | ICD-10-CM

## 2018-11-09 DIAGNOSIS — Z885 Allergy status to narcotic agent status: Secondary | ICD-10-CM

## 2018-11-09 DIAGNOSIS — M170 Bilateral primary osteoarthritis of knee: Secondary | ICD-10-CM | POA: Diagnosis present

## 2018-11-09 HISTORY — DX: Urinary tract infection, site not specified: N39.0

## 2018-11-09 LAB — COMPREHENSIVE METABOLIC PANEL
ALT: 29 U/L (ref 0–44)
AST: 20 U/L (ref 15–41)
Albumin: 3 g/dL — ABNORMAL LOW (ref 3.5–5.0)
Alkaline Phosphatase: 94 U/L (ref 38–126)
Anion gap: 12 (ref 5–15)
BUN: 7 mg/dL — ABNORMAL LOW (ref 8–23)
CO2: 24 mmol/L (ref 22–32)
Calcium: 8.9 mg/dL (ref 8.9–10.3)
Chloride: 101 mmol/L (ref 98–111)
Creatinine, Ser: 0.7 mg/dL (ref 0.44–1.00)
GFR calc Af Amer: >60 mL/min (ref >60)
GFR calc non Af Amer: >60 mL/min (ref >60)
Glucose, Bld: 102 mg/dL — ABNORMAL HIGH (ref 70–99)
Potassium: 3.4 mmol/L — ABNORMAL LOW (ref 3.5–5.1)
Sodium: 137 mmol/L (ref 135–145)
Total Bilirubin: 0.4 mg/dL (ref 0.3–1.2)
Total Protein: 7 g/dL (ref 6.5–8.1)

## 2018-11-09 LAB — URINALYSIS, ROUTINE W REFLEX MICROSCOPIC
Bilirubin Urine: NEGATIVE
Glucose, UA: NEGATIVE mg/dL
Ketones, ur: NEGATIVE mg/dL
Nitrite: NEGATIVE
Protein, ur: 30 mg/dL — AB
Specific Gravity, Urine: 1.01 (ref 1.005–1.030)
pH: 6 (ref 5.0–8.0)

## 2018-11-09 LAB — CBC WITH DIFFERENTIAL/PLATELET
Abs Immature Granulocytes: 0.04 10*3/uL (ref 0.00–0.07)
Basophils Absolute: 0.1 10*3/uL (ref 0.0–0.1)
Basophils Relative: 1 %
Eosinophils Absolute: 0.1 10*3/uL (ref 0.0–0.5)
Eosinophils Relative: 1 %
HCT: 37.7 % (ref 36.0–46.0)
Hemoglobin: 11.7 g/dL — ABNORMAL LOW (ref 12.0–15.0)
Immature Granulocytes: 0 %
Lymphocytes Relative: 18 %
Lymphs Abs: 2.1 10*3/uL (ref 0.7–4.0)
MCH: 25.2 pg — ABNORMAL LOW (ref 26.0–34.0)
MCHC: 31 g/dL (ref 30.0–36.0)
MCV: 81.3 fL (ref 80.0–100.0)
Monocytes Absolute: 1.6 10*3/uL — ABNORMAL HIGH (ref 0.1–1.0)
Monocytes Relative: 14 %
Neutro Abs: 7.7 10*3/uL (ref 1.7–7.7)
Neutrophils Relative %: 66 %
Platelets: 317 10*3/uL (ref 150–400)
RBC: 4.64 MIL/uL (ref 3.87–5.11)
RDW: 18.4 % — ABNORMAL HIGH (ref 11.5–15.5)
WBC: 11.6 10*3/uL — ABNORMAL HIGH (ref 4.0–10.5)
nRBC: 0 % (ref 0.0–0.2)

## 2018-11-09 LAB — LACTIC ACID, PLASMA
Lactic Acid, Venous: 1 mmol/L (ref 0.5–1.9)
Lactic Acid, Venous: 0.8 mmol/L (ref 0.5–1.9)

## 2018-11-09 LAB — SARS CORONAVIRUS 2 BY RT PCR (HOSPITAL ORDER, PERFORMED IN ~~LOC~~ HOSPITAL LAB): SARS Coronavirus 2: NEGATIVE

## 2018-11-09 LAB — SEDIMENTATION RATE: Sed Rate: 47 mm/hr — ABNORMAL HIGH (ref 0–22)

## 2018-11-09 LAB — C-REACTIVE PROTEIN: CRP: 19.1 mg/dL — ABNORMAL HIGH (ref <1.0)

## 2018-11-09 MED ORDER — SODIUM CHLORIDE 0.9% FLUSH
3.0000 mL | Freq: Once | INTRAVENOUS | Status: AC
  Administered 2018-11-09: 3 mL via INTRAVENOUS

## 2018-11-09 MED ORDER — SORBITOL 70 % SOLN: 30.0000 mL | Freq: Every day | Status: DC | PRN

## 2018-11-09 MED ORDER — MAGNESIUM HYDROXIDE 400 MG/5ML PO SUSP: 30.0000 mL | Freq: Every day | ORAL | Status: DC | PRN

## 2018-11-09 MED ORDER — SODIUM CHLORIDE 0.9 % IV SOLN
1.0000 g | INTRAVENOUS | Status: DC
  Administered 2018-11-10 – 2018-11-11 (×2): 1 g via INTRAVENOUS
  Filled 2018-11-09: qty 1
  Filled 2018-11-09: qty 10
  Filled 2018-11-09: qty 1

## 2018-11-09 MED ORDER — FERROUS SULFATE 325 (65 FE) MG PO TABS
325.0000 mg | ORAL_TABLET | Freq: Two times a day (BID) | ORAL | Status: DC
  Administered 2018-11-09 – 2018-11-12 (×6): 325 mg via ORAL
  Filled 2018-11-09 (×6): qty 1

## 2018-11-09 MED ORDER — POTASSIUM CHLORIDE CRYS ER 20 MEQ PO TBCR: 40.0000 meq | EXTENDED_RELEASE_TABLET | Freq: Two times a day (BID) | ORAL | Status: DC

## 2018-11-09 MED ORDER — FLEET ENEMA 7-19 GM/118ML RE ENEM
1.0000 | ENEMA | Freq: Once | RECTAL | Status: DC | PRN
  Filled 2018-11-09: qty 1

## 2018-11-09 MED ORDER — ONDANSETRON HCL 4 MG/2ML IJ SOLN: 4.0000 mg | Freq: Four times a day (QID) | INTRAMUSCULAR | Status: DC | PRN

## 2018-11-09 MED ORDER — DULOXETINE HCL 60 MG PO CPEP
60.0000 mg | ORAL_CAPSULE | Freq: Every day | ORAL | Status: DC
  Administered 2018-11-09 – 2018-11-12 (×4): 60 mg via ORAL
  Filled 2018-11-09 (×4): qty 1

## 2018-11-09 MED ORDER — SODIUM CHLORIDE 0.9 % IV SOLN
INTRAVENOUS | Status: DC
  Administered 2018-11-09 – 2018-11-11 (×3): via INTRAVENOUS

## 2018-11-09 MED ORDER — SODIUM CHLORIDE 0.9% FLUSH
3.0000 mL | Freq: Two times a day (BID) | INTRAVENOUS | Status: DC
  Administered 2018-11-09 – 2018-11-12 (×4): 3 mL via INTRAVENOUS

## 2018-11-09 MED ORDER — ACETAMINOPHEN 650 MG RE SUPP: 650.0000 mg | Freq: Four times a day (QID) | RECTAL | Status: DC | PRN

## 2018-11-09 MED ORDER — ENOXAPARIN SODIUM 40 MG/0.4ML ~~LOC~~ SOLN
40.0000 mg | SUBCUTANEOUS | Status: DC
  Administered 2018-11-09 – 2018-11-11 (×2): 40 mg via SUBCUTANEOUS
  Filled 2018-11-09 (×2): qty 0.4

## 2018-11-09 MED ORDER — ONDANSETRON HCL 4 MG PO TABS: 4.0000 mg | ORAL_TABLET | Freq: Four times a day (QID) | ORAL | Status: DC | PRN

## 2018-11-09 MED ORDER — SODIUM CHLORIDE 0.9 % IV SOLN
1.0000 g | Freq: Once | INTRAVENOUS | Status: AC
  Administered 2018-11-09: 1 g via INTRAVENOUS
  Filled 2018-11-09: qty 10

## 2018-11-09 MED ORDER — POTASSIUM CHLORIDE CRYS ER 20 MEQ PO TBCR
40.0000 meq | EXTENDED_RELEASE_TABLET | Freq: Once | ORAL | Status: AC
  Administered 2018-11-09: 40 meq via ORAL
  Filled 2018-11-09: qty 2

## 2018-11-09 MED ORDER — SENNA 8.6 MG PO TABS
1.0000 | ORAL_TABLET | Freq: Two times a day (BID) | ORAL | Status: DC
  Administered 2018-11-09 – 2018-11-12 (×6): 8.6 mg via ORAL
  Filled 2018-11-09 (×7): qty 1

## 2018-11-09 MED ORDER — LACTATED RINGERS IV BOLUS
1000.0000 mL | Freq: Once | INTRAVENOUS | Status: AC
  Administered 2018-11-09: 1000 mL via INTRAVENOUS

## 2018-11-09 MED ORDER — ALPRAZOLAM 0.5 MG PO TABS
0.5000 mg | ORAL_TABLET | Freq: Every day | ORAL | Status: DC | PRN
  Administered 2018-11-11: 0.5 mg via ORAL
  Filled 2018-11-09: qty 1

## 2018-11-09 MED ORDER — ACETAMINOPHEN 325 MG PO TABS
650.0000 mg | ORAL_TABLET | Freq: Four times a day (QID) | ORAL | Status: DC | PRN
  Administered 2018-11-10 – 2018-11-11 (×5): 650 mg via ORAL
  Filled 2018-11-09 (×5): qty 2

## 2018-11-09 MED ORDER — OXYCODONE-ACETAMINOPHEN 5-325 MG PO TABS
1.0000 | ORAL_TABLET | Freq: Once | ORAL | Status: AC
  Administered 2018-11-09: 1 via ORAL
  Filled 2018-11-09: qty 1

## 2018-11-09 MED ORDER — LACTATED RINGERS IV BOLUS
500.0000 mL | Freq: Once | INTRAVENOUS | Status: AC
  Administered 2018-11-09: 500 mL via INTRAVENOUS

## 2018-11-09 MED ORDER — ACETAMINOPHEN 500 MG PO TABS: 1000.0000 mg | ORAL_TABLET | Freq: Four times a day (QID) | ORAL | Status: DC | PRN

## 2018-11-09 MED ORDER — ZOLPIDEM TARTRATE 5 MG PO TABS
5.0000 mg | ORAL_TABLET | Freq: Every evening | ORAL | Status: DC | PRN
  Filled 2018-11-09: qty 1

## 2018-11-09 NOTE — ED Triage Notes (Addendum)
C/o fever for over a week-- was seen by reg dr-- covid negative test-- has had headaches, body aches, has been taking tylenol and ibuprofen -- has taken tylenol at 8am,  Is taking z-pak from primary care--

## 2018-11-09 NOTE — ED Provider Notes (Signed)
[facility] EMERGENCY DEPARTMENT Provider Note   CSN: [id number] Arrival date & time: 11/09/18  1011    History   Chief Complaint Chief Complaint  Patient presents with  . Fever  . Generalized Body Aches  . Headache    HPI Cassidy Reeves is a 63 y.o. female.     HPI Patient starting sick a week ago.  She had fevers up to 101.  She was getting waxing and waning intense chills and sweats.  Patient reports that she had general body aches.  She did not develop any significant cough.  She reports only a dry occasional cough.  No chest pain or shortness of breath.  No abdominal pain nausea or vomiting.  She just reports intense fatigue.  She did have headache that was waxing and waning.  Headache was worse at the beginning of the week but now has begun to abate.  No tick bites or mosquito bites that she is noted.  She reports she predominately stays around her house.  No rashes.  She reports over 4 weeks ago she had a truncal rash that was pruritic but has resolved with treatment.  Patient was tested for coronavirus by her PCP which tested negative.  She had been placed empirically on a Z-Pak.  She reports she is really not noted any improvement on Z-Pak. Past Medical History:  Diagnosis Date  . Anxiety   . Arthritis   . GERD (gastroesophageal reflux disease)   . History of kidney stones   . Pelvic mass     Patient Active Problem List   Diagnosis Date Noted  . History of depression 05/22/2017  . Primary osteoarthritis of both hands 07/22/2016  . DDD lumbar spine 07/22/2016  . Trigger finger 07/10/2016  . Psoriatic arthropathy ([place]) 07/10/2016  . High risk medication use 06/13/2016  . Primary osteoarthritis of both knees 06/13/2016  . History of gastric bypass 06/13/2016  . Vitamin D deficiency 06/13/2016  . Psoriasis 02/18/2013  . 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 ABDOMINAL HYSTERECTOMY    .  APPENDECTOMY    . BACK SURGERY    . CHOLECYSTECTOMY    . EXPLORATORY LAPAROTOMY    . GASTRIC BYPASS    . KNEE ARTHROSCOPY    . LITHOTRIPSY       OB History   No obstetric history on file.      Home Medications    Prior to Admission medications   Medication Sig Start Date End Date Taking? Authorizing Provider  acetaminophen (TYLENOL) 500 MG tablet Take 1,000 mg by mouth every 6 (six) hours as needed for mild pain, fever or headache.   Yes [provider]  ALPRAZolam Duanne Moron) 0.5 MG tablet Take 0.5 mg by mouth daily as needed for anxiety.  10/13/11  Yes [provider]  azithromycin (ZITHROMAX) 250 MG tablet Take 250-500 mg by mouth See admin instructions. Take 500mg  on first day, then take 250mg  daily until gone. 11/06/18  Yes [provider]  clobetasol cream (TEMOVATE) 2.35 % Apply 1 application topically daily as needed (psorisis on elbows).  05/31/16  Yes [provider]  DULoxetine (CYMBALTA) 60 MG capsule Take 60 mg by mouth daily.  05/17/16  Yes [provider]  estradiol (VIVELLE-DOT) 0.05 MG/24HR patch Place 1 patch onto the skin 2 (two) times a week.  07/23/16  Yes [provider]  ferrous sulfate 325 (65 FE) MG tablet  Take 325 mg by mouth 2 (two) times daily. 09/28/18  Yes [provider]  hydrOXYzine (ATARAX/VISTARIL) 25 MG tablet Take 25 mg by mouth 3 (three) times daily as needed for itching. 09/15/18  Yes [provider]  ibuprofen (ADVIL,MOTRIN) 200 MG tablet Take 400 mg by mouth every 6 (six) hours as needed for fever or mild pain.    Yes [provider]  Omega-3 Fatty Acids (OMEGA 3 PO) Take 2,000 mg by mouth daily.   Yes [provider]  Secukinumab 150 MG/ML SOAJ Inject 300 mg into the skin every 30 (thirty) days. 08/20/18  Yes Deveshwar, Abel Presto, MD  TURMERIC PO Take 1 capsule by mouth daily.    Yes [provider]  VITAMIN D PO Take 1 tablet by mouth daily.   Yes [provider]  zolpidem (AMBIEN) 10 MG tablet Take 10 mg by mouth at bedtime as needed for sleep. At bedtime as needed  09/04/11  Yes [provider]    Family History Family History  Problem Relation Age of Onset  . Heart disease Father   . Arthritis Father   . Psoriasis Father   . Parkinsonism Father   . Other Father 26       pacemaker  . Colon cancer Paternal Grandfather   . Heart failure Paternal Grandmother   . Stroke Maternal Grandmother   . Esophageal cancer Neg Hx   . Rectal cancer Neg Hx   . Stomach cancer Neg Hx     Social History Social History   Tobacco Use  . Smoking status: Never Smoker  . Smokeless tobacco: Never Used  Substance Use Topics  . Alcohol use: Yes    Comment: occasionally  . Drug use: Never     Allergies   Dilaudid [hydromorphone hcl] and Penicillins   Review of Systems Review of Systems 10 Systems reviewed and are negative for acute change except as noted in the HPI.   Physical Exam Updated Vital Signs BP (!) 118/52   Pulse 80   Temp (!) 100.5 F (38.1 C) (Oral)   Resp (!) 21   Ht 5\' 6"  (1.676 m)   Wt 106.6 kg   SpO2 94%   BMI 37.93 kg/m   Physical Exam Constitutional:      Appearance: She is well-developed.     Comments: Patient is alert and appropriate.  No respiratory distress.  Mental status clear.  HENT:     Head: Normocephalic and atraumatic.     Nose: Nose normal.     Mouth/Throat:     Mouth: Mucous membranes are moist.     Pharynx: Oropharynx is clear.  Eyes:     Extraocular Movements: Extraocular movements intact.     Conjunctiva/sclera: Conjunctivae normal.     Pupils: Pupils are equal, round, and reactive to light.  Neck:     Musculoskeletal: Neck supple. No neck rigidity.  Cardiovascular:     Rate and Rhythm: Normal rate and regular rhythm.     Heart sounds: Normal heart sounds.  Pulmonary:     Effort: Pulmonary effort is normal.     Breath sounds: Normal breath sounds.  Abdominal:      General: Bowel sounds are normal. There is no distension.     Palpations: Abdomen is soft.     Tenderness: There is no abdominal tenderness.  Musculoskeletal: Normal range of motion.        General: No swelling or tenderness.     Right lower leg: No  edema.     Left lower leg: No edema.  Lymphadenopathy:     Cervical: No cervical adenopathy.  Skin:    General: Skin is warm and dry.     Findings: No rash.  Neurological:     General: No focal deficit present.     Mental Status: She is alert and oriented to person, place, and time.     GCS: GCS eye subscore is 4. GCS verbal subscore is 5. GCS motor subscore is 6.     Cranial Nerves: No cranial nerve deficit.     Motor: No weakness.     Coordination: Coordination normal.  Psychiatric:        Mood and Affect: Mood normal.      ED Treatments / Results  Labs (all labs ordered are listed, but only abnormal results are displayed) Labs Reviewed  COMPREHENSIVE METABOLIC PANEL - Abnormal; Notable for the following components:      Result Value   Potassium 3.4 (*)    Glucose, Bld 102 (*)    BUN 7 (*)    Albumin 3.0 (*)    All other components within normal limits  CBC WITH DIFFERENTIAL/PLATELET - Abnormal; Notable for the following components:   WBC 11.6 (*)    Hemoglobin 11.7 (*)    MCH 25.2 (*)    RDW 18.4 (*)    Monocytes Absolute 1.6 (*)    All other components within normal limits  URINALYSIS, ROUTINE W REFLEX MICROSCOPIC - Abnormal; Notable for the following components:   Hgb urine dipstick SMALL (*)    Protein, ur 30 (*)    Leukocytes,Ua TRACE (*)    Bacteria, UA MANY (*)    All other components within normal limits  URINE CULTURE  CULTURE, BLOOD (ROUTINE X 2)  CULTURE, BLOOD (ROUTINE X 2)  ARTHROPOD IDENTIFICATION  SARS CORONAVIRUS 2 (HOSPITAL ORDER, [facility])  LACTIC ACID, PLASMA  LACTIC ACID, PLASMA  ROCKY MTN SPOTTED FVR ABS PNL(IGG+IGM)  B. BURGDORFI ANTIBODIES    EKG None   Radiology Dg Chest 2 View  Result Date: 11/09/2018 CLINICAL DATA:  Fever for 1 week. Very weak. EXAM: CHEST - 2 VIEW COMPARISON:  11/27/2015. FINDINGS: The heart size and mediastinal contours are within normal limits. Both lungs are clear. The visualized skeletal structures are unremarkable. Stable scarring LEFT base. IMPRESSION: No active cardiopulmonary disease. Electronically Signed   By: Staci Righter M.D.   On: 11/09/2018 11:09    Procedures Procedures (including critical care time)  Medications Ordered in ED Medications  cefTRIAXone (ROCEPHIN) 1 g in sodium chloride 0.9 % 100 mL IVPB (has no administration in time range)  lactated ringers bolus 1,000 mL (has no administration in time range)  sodium chloride flush (NS) 0.9 % injection 3 mL (3 mLs Intravenous Given 11/09/18 1333)  oxyCODONE-acetaminophen (PERCOCET/ROXICET) 5-325 MG per tablet 1 tablet (1 tablet Oral Given 11/09/18 1333)  lactated ringers bolus 500 mL (0 mLs Intravenous Stopped 11/09/18 1415)     Initial Impression / Assessment and Plan / ED Course  I have reviewed the triage vital signs and the nursing notes.  Pertinent labs & imaging results that were available during my care of the patient were reviewed by me and considered in my medical decision making (see chart for details).       Patient with symptoms for approximately 1 week.  She started with febrile illness with chills and sweats and myalgia.  Her PCP tested her for coronavirus which  tested negative on outpatient basis.  Was empirically placed on Zithromax.  No improvement.  Will repeat COVID testing.  She had a low-grade fever today at 100.5.  Patient continues to feel fatigued.  Headaches have improved since earlier in the week. Mental Status is clear.  No meningismus.  Patient's urinalysis does test positive for leukocytes and white cells.  At this time I feel that empiric treatment is warranted with continued symptoms without focus of infectious etiology.   Patient does not have lactic acidosis but continues to have fever and has some baseline immunosuppression on the suppressants for psoriasis.  Patient's blood pressures are trending down slightly despite hydration.  Systolic blood pressure is at 104 at this time, heart rate remains normal.  No respiratory distress and mental status clear.  Will plan for admission to continue hydration and administration of antibiotics pending blood culture return and symptomatic improvement.  Final Clinical Impressions(s) / ED Diagnoses   Final diagnoses:  Acute cystitis without hematuria  Myalgia  Fever, unspecified fever cause  Immunosuppression due to drug therapy    ED Discharge Orders    None       Charlesetta Shanks, MD 11/09/18 1538

## 2018-11-09 NOTE — H&P (Signed)
[facility] History and Physical  Niketa Turner IWP:[id number] DOB: 10-22-1955 DOA: 11/09/2018 Referring physician: ED PCP: Haywood Pao, MD  Chief Complaint: Generalized weakness, fever ------------------------------------------------------------------------------------------------------ Assessment/Plan: Active Problems:   * No active hospital problems. *  Fever/malaise/generalized weakness -Symptoms ongoing for a week, did not respond to 3-day course of defect. -No clear source of infection at this time.  However urine shows many bacteria with trace leukocytes.  She has been presumptively started on IV Rocephin for possible UTI.  I will continue the same for now. -Also send for repeat COVID testing. -Added CRP, ESR. -Continue to monitor clinically. -Trend temperature and WBC pattern.  Psoriatic arthritis -Takes Cosentyx injection monthly. -Takes ibuprofen regularly scheduled.  Avoids it on empty stomach.  Denies any history of renal impairment or stomach ulcers.  Also recently started on Nexium for drop in hemoglobin to 10.7, hemoglobin is 11.7 today.  History of gastric bypass  Mobility: Encourage ambulation Diet: Regular diet DVT prophylaxis:  Lovenox subcu Code Status:  Full code Family Communication:  Disposition Plan:  If no fever, may be able to go home tomorrow.  ----------------------------------------------------------------------------------------------------- History of Present Illness:  Patient is a 63 year old Caucasian female with history of psoriatic arthritis on immunosuppression, osteoarthritis, gastric bypass, obesity, vitamin D deficiency who lives at home with her husband and is functionally active. Patient presented to the ED today with 1 week history of fever and generalized weakness. It is started Monday a week ago with fatigue.  She was having waxing and waning chills and sweats, fever measured up to 101.  Had generalized body aches.   Occasional dry cough, no shortness of breath chest pain.  No tick bite for muscular bite.  No exposure to anybody with COVID 19.  Patient states that she and her husband have essentially stayed inside the house to avoid exposure. She went to her primary care provider on Friday.  Tested negative for COVID, but patient states the nasal swab was not a deep sample.  She was started on Z-Pak which she took for 3 days. She did not feel any improvement in her symptoms.  Her temperature this morning was still more than 100 and then she decided to come to ED. No burning urination, urine frequency or foul-smell.  No shortness of breath, palpitation, meningismus, altered mentation, nausea, vomiting, diarrhea, abdominal pain, nonhealing wound. She does have psoriatic arthritis and has been on Cosentyx (monthly injection) for about a year.  She has not had any bad reaction to it.  She lives with some generalized joint and takes ibuprofen on a regular basis.  She has not noticed any added swelling or pain of any of her joints.   In the ED, she had a fever 100.5.  Heart rate less than 100.  Blood pressure 129/64 on arrival, low at 96/54 at the time of my evaluation.  Breathing comfortably on room air. Labs showed WBC count slightly elevated to 11.6, hemoglobin 11.7, platelet 317. Sodium 137, potassium 3.4, BUN/creatinine 7/0.7, albumin of 3, lactic acid normal. Urinalysis clear yellow urine many bacteria, trace leukocytes protein of 30. COVID-19 test sent, has not resulted yet. Blood culture and urine culture sent. Chest x-ray did not show any acute cardiopulmonary disease.  Past medical history: Past Medical History:  Diagnosis Date  . Anxiety   . Arthritis   . GERD (gastroesophageal reflux disease)   . History of kidney stones   . Pelvic mass     Past surgical history: Past Surgical History:  Procedure  Laterality Date  . ABDOMINAL HYSTERECTOMY    . APPENDECTOMY    . BACK SURGERY    . CHOLECYSTECTOMY     . EXPLORATORY LAPAROTOMY    . GASTRIC BYPASS    . KNEE ARTHROSCOPY    . LITHOTRIPSY      Social History:  reports that she has never smoked. She has never used smokeless tobacco. She reports current alcohol use. She reports that she does not use drugs.  Allergies:  Allergies  Allergen Reactions  . Dilaudid [Hydromorphone Hcl]     Crazy hallucinations per the pt  . Penicillins     Breaks out and itches per the pt Did it involve swelling of the face/tongue/throat, SOB, or low BP? Unknown Did it involve sudden or severe rash/hives, skin peeling, or any reaction on the inside of your mouth or nose? Unknown Did you need to seek medical attention at a hospital or doctor's office? Unknown When did it last happen? childhood If all above answers are "NO", may proceed with cephalosporin use.     Family history:  Family History  Problem Relation Age of Onset  . Heart disease Father   . Arthritis Father   . Psoriasis Father   . Parkinsonism Father   . Other Father 23       pacemaker  . Colon cancer Paternal Grandfather   . Heart failure Paternal Grandmother   . Stroke Maternal Grandmother   . Esophageal cancer Neg Hx   . Rectal cancer Neg Hx   . Stomach cancer Neg Hx      Home Meds: Prior to Admission medications   Medication Sig Start Date End Date Taking? Authorizing Provider  acetaminophen (TYLENOL) 500 MG tablet Take 1,000 mg by mouth every 6 (six) hours as needed for mild pain, fever or headache.   Yes [provider]  ALPRAZolam Duanne Moron) 0.5 MG tablet Take 0.5 mg by mouth daily as needed for anxiety.  10/13/11  Yes [provider]  azithromycin (ZITHROMAX) 250 MG tablet Take 250-500 mg by mouth See admin instructions. Take 558m on first day, then take 2572mdaily until gone. 11/06/18  Yes [provider]  clobetasol cream (TEMOVATE) 0.5.62 Apply 1 application topically daily as needed (psorisis on elbows).  05/31/16  Yes [provider]  DULoxetine (CYMBALTA) 60 MG capsule Take 60 mg by mouth daily.  05/17/16  Yes [provider]  estradiol (VIVELLE-DOT) 0.05 MG/24HR patch Place 1 patch onto the skin 2 (two) times a week.  07/23/16  Yes [provider]  ferrous sulfate 325 (65 FE) MG tablet Take 325 mg by mouth 2 (two) times daily. 09/28/18  Yes [provider]  hydrOXYzine (ATARAX/VISTARIL) 25 MG tablet Take 25 mg by mouth 3 (three) times daily as needed for itching. 09/15/18  Yes [provider]  ibuprofen (ADVIL,MOTRIN) 200 MG tablet Take 400 mg by mouth every 6 (six) hours as needed for fever or mild pain.    Yes [provider]  Omega-3 Fatty Acids (OMEGA 3 PO) Take 2,000 mg by mouth daily.   Yes [provider]  Secukinumab 150 MG/ML SOAJ Inject 300 mg into the skin every 30 (thirty) days. 08/20/18  Yes Deveshwar, ShAbel PrestoMD  TURMERIC PO Take 1 capsule by mouth daily.    Yes [provider]  VITAMIN D PO Take 1 tablet by mouth daily.   Yes [provider]  zolpidem (AMBIEN) 10 MG tablet Take 10 mg by mouth at bedtime  as needed for sleep. At bedtime as needed  09/04/11  Yes [provider]    Physical Exam: Vitals:   11/09/18 1251 11/09/18 1252 11/09/18 1330 11/09/18 1415  BP:   (!) 119/44 (!) 118/52  Pulse: 90 85 86 80  Resp: (!) 25 (!) 23 (!) 26 (!) 21  Temp:      TempSrc:      SpO2: 98% 99% 98% 94%  Weight:      Height:       Wt Readings from Last 3 Encounters:  11/09/18 106.6 kg  04/01/18 112 kg  11/07/17 112.9 kg   Body mass index is 37.93 kg/m.  General exam: Appears calm and comfortable.  Obese, lying down in bed Skin: No rashes, lesions or ulcers. HEENT: Atraumatic, normocephalic, supple neck, no obvious bleeding Lungs: Clear to auscultation bilaterally CVS: Regular rate and rhythm, no murmur GI/Abd soft, distended from obesity, nontender, bowel sound present CNS: Alert, awake, oriented x3 Psychiatry: Appropriate mood  Extremities: No pedal edema, no calf tenderness  Labs on Admission:   CBC: Recent Labs  Lab 11/09/18 1050  WBC 11.6*  NEUTROABS 7.7  HGB 11.7*  HCT 37.7  MCV 81.3  PLT 226    Basic Metabolic Panel: Recent Labs  Lab 11/09/18 1050  NA 137  K 3.4*  CL 101  CO2 24  GLUCOSE 102*  BUN 7*  CREATININE 0.70  CALCIUM 8.9    Liver Function Tests: Recent Labs  Lab 11/09/18 1050  AST 20  ALT 29  ALKPHOS 94  BILITOT 0.4  PROT 7.0  ALBUMIN 3.0*   No results for input(s): LIPASE, AMYLASE in the last 168 hours. No results for input(s): AMMONIA in the last 168 hours.  Cardiac Enzymes: No results for input(s): CKTOTAL, CKMB, CKMBINDEX, TROPONINI in the last 168 hours.  BNP (last 3 results) No results for input(s): BNP in the last 8760 hours.  ProBNP (last 3 results) No results for input(s): PROBNP in the last 8760 hours.  CBG: No results for input(s): GLUCAP in the last 168 hours.  Lipase  No results found for: LIPASE   Urinalysis    Component Value Date/Time   COLORURINE YELLOW 11/09/2018 [place] 11/09/2018 1046   LABSPEC 1.010 11/09/2018 1046   PHURINE 6.0 11/09/2018 1046   GLUCOSEU NEGATIVE 11/09/2018 1046   HGBUR SMALL (A) 11/09/2018 1046   BILIRUBINUR NEGATIVE 11/09/2018 1046   KETONESUR NEGATIVE 11/09/2018 1046   PROTEINUR 30 (A) 11/09/2018 1046   NITRITE NEGATIVE 11/09/2018 1046   LEUKOCYTESUR TRACE (A) 11/09/2018 1046     Drugs of Abuse  No results found for: LABOPIA, COCAINSCRNUR, LABBENZ, AMPHETMU, THCU, LABBARB    Radiological Exams on Admission: Dg Chest 2 View  Result Date: 11/09/2018 CLINICAL DATA:  Fever for 1 week. Very weak. EXAM: CHEST - 2 VIEW COMPARISON:  11/27/2015. FINDINGS: The heart size and mediastinal contours are within normal limits. Both lungs are clear. The visualized skeletal structures are unremarkable. Stable scarring LEFT base. IMPRESSION: No active cardiopulmonary disease. Electronically Signed   By:  Staci Righter M.D.   On: 11/09/2018 11:09   ----------------------------------------------------------------------------------------------------------------------------------------------------------- Severity of Illness: The appropriate patient status for this patient is OBSERVATION. Observation status is judged to be reasonable and necessary in order to provide the required intensity of service to ensure the patient's safety. [name] presenting symptoms, physical exam findings, and initial radiographic and laboratory data in the context of their medical condition is felt to place them at  decreased risk for further clinical deterioration. Furthermore, it is anticipated that the patient will be medically stable for discharge from the hospital within 2 midnights of admission. The following factors support the patient status of observation.   " [name] presenting symptoms include fever, generalized weakness. " The physical exam findings include fever, generalized weakness. " The initial radiographic and laboratory data are urinalysis abnormal.    Signed, Terrilee Croak, MD [facility] 11/09/2018

## 2018-11-09 NOTE — Progress Notes (Signed)
NEW ADMISSION NOTE New Admission Note:   Arrival Method: stretcher from the ED Mental Orientation: alert and oriented x4  Telemetry: none  Assessment: Completed Skin: intact  PP:JKDT AC Pain: denies  Tubes:none  Safety Measures: Safety Fall Prevention Plan has been discussed Admission: Completed [facility] Orientation: Patient has been orientated to the room, unit and staff.  Family: none   Orders have been reviewed and implemented. Will continue to monitor the patient. Call light has been placed within reach and bed alarm has been activated.   Paulla Fore, RN

## 2018-11-10 ENCOUNTER — Encounter (HOSPITAL_COMMUNITY): Payer: Self-pay | Admitting: General Practice

## 2018-11-10 ENCOUNTER — Other Ambulatory Visit: Payer: Self-pay

## 2018-11-10 ENCOUNTER — Observation Stay (HOSPITAL_COMMUNITY): Payer: Managed Care, Other (non HMO)

## 2018-11-10 DIAGNOSIS — Z87442 Personal history of urinary calculi: Secondary | ICD-10-CM | POA: Diagnosis not present

## 2018-11-10 DIAGNOSIS — R531 Weakness: Secondary | ICD-10-CM | POA: Diagnosis present

## 2018-11-10 DIAGNOSIS — R6510 Systemic inflammatory response syndrome (SIRS) of non-infectious origin without acute organ dysfunction: Secondary | ICD-10-CM | POA: Diagnosis not present

## 2018-11-10 DIAGNOSIS — R9431 Abnormal electrocardiogram [ECG] [EKG]: Secondary | ICD-10-CM | POA: Diagnosis present

## 2018-11-10 DIAGNOSIS — I959 Hypotension, unspecified: Secondary | ICD-10-CM | POA: Diagnosis present

## 2018-11-10 DIAGNOSIS — Z90710 Acquired absence of both cervix and uterus: Secondary | ICD-10-CM | POA: Diagnosis not present

## 2018-11-10 DIAGNOSIS — N390 Urinary tract infection, site not specified: Secondary | ICD-10-CM | POA: Diagnosis not present

## 2018-11-10 DIAGNOSIS — L4050 Arthropathic psoriasis, unspecified: Secondary | ICD-10-CM | POA: Diagnosis present

## 2018-11-10 DIAGNOSIS — N3000 Acute cystitis without hematuria: Secondary | ICD-10-CM | POA: Diagnosis present

## 2018-11-10 DIAGNOSIS — M170 Bilateral primary osteoarthritis of knee: Secondary | ICD-10-CM | POA: Diagnosis present

## 2018-11-10 DIAGNOSIS — R509 Fever, unspecified: Secondary | ICD-10-CM

## 2018-11-10 DIAGNOSIS — Z6837 Body mass index (BMI) 37.0-37.9, adult: Secondary | ICD-10-CM | POA: Diagnosis not present

## 2018-11-10 DIAGNOSIS — R402252 Coma scale, best verbal response, oriented, at arrival to emergency department: Secondary | ICD-10-CM | POA: Diagnosis present

## 2018-11-10 DIAGNOSIS — M19042 Primary osteoarthritis, left hand: Secondary | ICD-10-CM | POA: Diagnosis present

## 2018-11-10 DIAGNOSIS — R402142 Coma scale, eyes open, spontaneous, at arrival to emergency department: Secondary | ICD-10-CM | POA: Diagnosis present

## 2018-11-10 DIAGNOSIS — B9620 Unspecified Escherichia coli [E. coli] as the cause of diseases classified elsewhere: Secondary | ICD-10-CM | POA: Diagnosis present

## 2018-11-10 DIAGNOSIS — Z1629 Resistance to other single specified antibiotic: Secondary | ICD-10-CM | POA: Diagnosis present

## 2018-11-10 DIAGNOSIS — R402362 Coma scale, best motor response, obeys commands, at arrival to emergency department: Secondary | ICD-10-CM | POA: Diagnosis present

## 2018-11-10 DIAGNOSIS — E669 Obesity, unspecified: Secondary | ICD-10-CM | POA: Diagnosis present

## 2018-11-10 DIAGNOSIS — M5136 Other intervertebral disc degeneration, lumbar region: Secondary | ICD-10-CM | POA: Diagnosis present

## 2018-11-10 DIAGNOSIS — E559 Vitamin D deficiency, unspecified: Secondary | ICD-10-CM | POA: Diagnosis present

## 2018-11-10 DIAGNOSIS — Z20828 Contact with and (suspected) exposure to other viral communicable diseases: Secondary | ICD-10-CM | POA: Diagnosis present

## 2018-11-10 DIAGNOSIS — Z9049 Acquired absence of other specified parts of digestive tract: Secondary | ICD-10-CM | POA: Diagnosis not present

## 2018-11-10 DIAGNOSIS — Z9884 Bariatric surgery status: Secondary | ICD-10-CM | POA: Diagnosis not present

## 2018-11-10 DIAGNOSIS — M19041 Primary osteoarthritis, right hand: Secondary | ICD-10-CM | POA: Diagnosis present

## 2018-11-10 DIAGNOSIS — Z79890 Hormone replacement therapy (postmenopausal): Secondary | ICD-10-CM | POA: Diagnosis not present

## 2018-11-10 DIAGNOSIS — F419 Anxiety disorder, unspecified: Secondary | ICD-10-CM | POA: Diagnosis present

## 2018-11-10 DIAGNOSIS — Z1611 Resistance to penicillins: Secondary | ICD-10-CM | POA: Diagnosis present

## 2018-11-10 LAB — CBC WITH DIFFERENTIAL/PLATELET
Abs Immature Granulocytes: 0.03 10*3/uL (ref 0.00–0.07)
Basophils Absolute: 0.1 10*3/uL (ref 0.0–0.1)
Basophils Relative: 1 %
Eosinophils Absolute: 0.2 10*3/uL (ref 0.0–0.5)
Eosinophils Relative: 2 %
HCT: 32.6 % — ABNORMAL LOW (ref 36.0–46.0)
Hemoglobin: 10.2 g/dL — ABNORMAL LOW (ref 12.0–15.0)
Immature Granulocytes: 0 %
Lymphocytes Relative: 32 %
Lymphs Abs: 3 10*3/uL (ref 0.7–4.0)
MCH: 25.4 pg — ABNORMAL LOW (ref 26.0–34.0)
MCHC: 31.3 g/dL (ref 30.0–36.0)
MCV: 81.1 fL (ref 80.0–100.0)
Monocytes Absolute: 1.5 10*3/uL — ABNORMAL HIGH (ref 0.1–1.0)
Monocytes Relative: 16 %
Neutro Abs: 4.7 10*3/uL (ref 1.7–7.7)
Neutrophils Relative %: 49 %
Platelets: 268 10*3/uL (ref 150–400)
RBC: 4.02 MIL/uL (ref 3.87–5.11)
RDW: 18.2 % — ABNORMAL HIGH (ref 11.5–15.5)
WBC: 9.5 10*3/uL (ref 4.0–10.5)
nRBC: 0 % (ref 0.0–0.2)

## 2018-11-10 LAB — BASIC METABOLIC PANEL
Anion gap: 9 (ref 5–15)
BUN: 9 mg/dL (ref 8–23)
CO2: 24 mmol/L (ref 22–32)
Calcium: 8.5 mg/dL — ABNORMAL LOW (ref 8.9–10.3)
Chloride: 105 mmol/L (ref 98–111)
Creatinine, Ser: 0.64 mg/dL (ref 0.44–1.00)
GFR calc Af Amer: >60 mL/min (ref >60)
GFR calc non Af Amer: >60 mL/min (ref >60)
Glucose, Bld: 93 mg/dL (ref 70–99)
Potassium: 3.6 mmol/L (ref 3.5–5.1)
Sodium: 138 mmol/L (ref 135–145)

## 2018-11-10 LAB — HIV ANTIBODY (ROUTINE TESTING W REFLEX): HIV Screen 4th Generation wRfx: NONREACTIVE

## 2018-11-10 LAB — ECHOCARDIOGRAM COMPLETE
Height: 66 in
Weight: 3776 oz

## 2018-11-10 LAB — TSH: TSH: 2.769 u[IU]/mL (ref 0.350–4.500)

## 2018-11-10 LAB — B. BURGDORFI ANTIBODIES: B burgdorferi Ab IgG+IgM: <0.91 {ISR} (ref 0.00–0.90)

## 2018-11-10 NOTE — Plan of Care (Signed)
  Problem: Education: Goal: Knowledge of General Education information will improve Description Including pain rating scale, medication(s)/side effects and non-pharmacologic comfort measures Outcome: Progressing   Problem: Health Behavior/Discharge Planning: Goal: Ability to manage health-related needs will improve Outcome: Progressing   

## 2018-11-10 NOTE — Progress Notes (Signed)
Echocardiogram 2D Echocardiogram has been performed.  [name] 11/10/2018, 2:13 PM

## 2018-11-10 NOTE — Progress Notes (Signed)
PROGRESS NOTE  Cassidy Reeves ZOX:[id number] DOB: 09-13-1955 DOA: 11/09/2018 PCP: Haywood Pao, MD   LOS: 0 days   Brief narrative: Patient is a 63 year old Caucasian female with history of psoriatic arthritis on immunosuppression, osteoarthritis, gastric bypass, obesity, vitamin D deficiency who lives at home with her husband and is functionally active. Patient presented to the ED today with 1 week history of fever and generalized weakness. It is started Monday a week ago with fatigue.  She was having waxing and waning chills and sweats, fever measured up to 101.  Had generalized body aches.  Occasional dry cough, no shortness of breath chest pain.  No tick bite for muscular bite.  No exposure to anybody with COVID 19.  Patient states that she and her husband have essentially stayed inside the house to avoid exposure. She went to her primary care provider on Friday.  Tested negative for COVID, but patient states the nasal swab was not a deep sample.  She was started on Z-Pak which she took for 3 days. She did not feel any improvement in her symptoms.  Her temperature this morning was still more than 100 and then she decided to come to ED. No burning urination, urine frequency or foul-smell.  No shortness of breath, palpitation, meningismus, altered mentation, nausea, vomiting, diarrhea, abdominal pain, nonhealing wound. She does have psoriatic arthritis and has been on Cosentyx (monthly injection) for about a year.  She has not had any bad reaction to it.  She lives with some generalized joint and takes ibuprofen on a regular basis.  She has not noticed any added swelling or pain of any of her joints.   In the ED, she had a fever 100.5.  Heart rate less than 100.  Blood pressure 129/64 on arrival, low at 96/54 at the time of my evaluation.  Breathing comfortably on room air. Labs showed WBC count slightly elevated to 11.6, hemoglobin 11.7, platelet 317. Sodium 137, potassium 3.4, BUN/creatinine  7/0.7, albumin of 3, lactic acid normal. Urinalysis clear yellow urine many bacteria, trace leukocytes protein of 30. COVID-19 test sent, has not resulted yet. Blood culture and urine culture sent. Chest x-ray did not show any acute cardiopulmonary disease.  Subjective: Patient was seen and examined this morning.  Pleasant, middle-aged Caucasian female.  She is lying on bed.  Still feels weak even walking to the bathroom.  Feels feverish intermittently.  No fever recorded.    Assessment/Plan:  Active Problems:   UTI (urinary tract infection)  Fever/malaise/generalized weakness -Symptoms ongoing for a week, did not respond to 3-day course of Z-Pak. -Urinalysis on admission with many bacteria and trace leukocytes.  Currently on IV Rocephin for suspected UTI which we will continue.  -COVID-19 test negative.  -Elevated sed rate to 47 and CRP to 19, but she also has underlying psoriatic arthritis. -WBC count improved slightly to normal at 9.5 -Continue clinical monitoring, temperature trend and WBC. -Continues to feel weak.  Sent for TSH level.  Obtain an echocardiogram to rule out underlying subacute endocarditis. -Pending blood culture and urine culture report.  Psoriatic arthritis -Takes Cosentyx injection monthly. -Takes ibuprofen regularly scheduled. Knows she has to avoid taking it on empty stomach.  Denies any history of renal impairment or stomach ulcers.  Also recently started on Nexium for drop in hemoglobin, hemoglobin 10.2.  History of gastric bypass -   Mobility: Encourage ambulation Diet: Regular diet DVT prophylaxis: Lovenox subcu Code Status: Full code Family Communication:Patient herself has been updating her husband.  Disposition Plan: Patient was admitted yesterday under observation status.  Patient still feels significantly weak.  Pending urine culture and blood culture.  Pending echocardiogram to rule out infective endocarditis.  Work-up in progress.  I will  switch her to inpatient status.    Consultants:  None  Procedures:  None  Antimicrobials:  Anti-infectives (From admission, onward)   Start     Dose/Rate Route Frequency Ordered Stop   11/10/18 1600  cefTRIAXone (ROCEPHIN) 1 g in sodium chloride 0.9 % 100 mL IVPB     1 g 200 mL/hr over 30 Minutes Intravenous Every 24 hours 11/09/18 1746     11/09/18 1530  cefTRIAXone (ROCEPHIN) 1 g in sodium chloride 0.9 % 100 mL IVPB     1 g 200 mL/hr over 30 Minutes Intravenous  Once 11/09/18 1528 11/09/18 1624      Infusions:  . sodium chloride 75 mL/hr at 11/09/18 2243  . cefTRIAXone (ROCEPHIN)  IV      Scheduled Meds: . DULoxetine  60 mg Oral Daily  . enoxaparin (LOVENOX) injection  40 mg Subcutaneous Q24H  . ferrous sulfate  325 mg Oral BID  . senna  1 tablet Oral BID  . sodium chloride flush  3 mL Intravenous Q12H    PRN meds: acetaminophen **OR** acetaminophen, ALPRAZolam, magnesium hydroxide, ondansetron **OR** ondansetron (ZOFRAN) IV, sodium phosphate, sorbitol, zolpidem   Objective: Vitals:   11/10/18 0505 11/10/18 0842  BP: (!) 113/51 (!) 121/56  Pulse: 62 69  Resp: 18 18  Temp: 97.8 F (36.6 C) 98.1 F (36.7 C)  SpO2: 97% 96%    Intake/Output Summary (Last 24 hours) at 11/10/2018 1024 Last data filed at 11/10/2018 0555 Gross per 24 hour  Intake 1720.11 ml  Output 0 ml  Net 1720.11 ml   Filed Weights   11/09/18 1025 11/09/18 1041 11/09/18 1959  Weight: 106.6 kg 106.6 kg 107 kg   Weight change:  Body mass index is 38.09 kg/m.   Physical Exam: General exam: Appears calm and comfortable at rest.  Reports significant weakness on walking. Skin: No rashes, lesions or ulcers. HEENT: Atraumatic, normocephalic, supple neck, no obvious bleeding Lungs: Clear to auscultation bilaterally CVS: Regular rate and rhythm, no murmur GI/Abd soft, nontender, nondistended, bowel sound present CNS: Alert, awake, oriented x3 Psychiatry: Mood appropriate Extremities: No  pedal edema, no calf tenderness  Data Review: I have personally reviewed the laboratory data and studies available.  Recent Labs  Lab 11/09/18 1050 11/10/18 0544  WBC 11.6* 9.5  NEUTROABS 7.7 4.7  HGB 11.7* 10.2*  HCT 37.7 32.6*  MCV 81.3 81.1  PLT 317 268   Recent Labs  Lab 11/09/18 1050 11/10/18 0544  NA 137 138  K 3.4* 3.6  CL 101 105  CO2 24 24  GLUCOSE 102* 93  BUN 7* 9  CREATININE 0.70 0.64  CALCIUM 8.9 8.5*    Terrilee Croak, MD  [facility] 11/10/2018

## 2018-11-11 LAB — BASIC METABOLIC PANEL
Anion gap: 11 (ref 5–15)
BUN: 7 mg/dL — ABNORMAL LOW (ref 8–23)
CO2: 28 mmol/L (ref 22–32)
Calcium: 8.8 mg/dL — ABNORMAL LOW (ref 8.9–10.3)
Chloride: 103 mmol/L (ref 98–111)
Creatinine, Ser: 0.74 mg/dL (ref 0.44–1.00)
GFR calc Af Amer: >60 mL/min (ref >60)
GFR calc non Af Amer: >60 mL/min (ref >60)
Glucose, Bld: 89 mg/dL (ref 70–99)
Potassium: 3.7 mmol/L (ref 3.5–5.1)
Sodium: 142 mmol/L (ref 135–145)

## 2018-11-11 LAB — URINE CULTURE: Culture: >=100000 — AB

## 2018-11-11 LAB — CBC WITH DIFFERENTIAL/PLATELET
Abs Immature Granulocytes: 0.04 10*3/uL (ref 0.00–0.07)
Basophils Absolute: 0.1 10*3/uL (ref 0.0–0.1)
Basophils Relative: 1 %
Eosinophils Absolute: 0.4 10*3/uL (ref 0.0–0.5)
Eosinophils Relative: 6 %
HCT: 34.4 % — ABNORMAL LOW (ref 36.0–46.0)
Hemoglobin: 10.8 g/dL — ABNORMAL LOW (ref 12.0–15.0)
Immature Granulocytes: 1 %
Lymphocytes Relative: 35 %
Lymphs Abs: 2.5 10*3/uL (ref 0.7–4.0)
MCH: 25.7 pg — ABNORMAL LOW (ref 26.0–34.0)
MCHC: 31.4 g/dL (ref 30.0–36.0)
MCV: 81.7 fL (ref 80.0–100.0)
Monocytes Absolute: 0.9 10*3/uL (ref 0.1–1.0)
Monocytes Relative: 13 %
Neutro Abs: 3.1 10*3/uL (ref 1.7–7.7)
Neutrophils Relative %: 44 %
Platelets: 316 10*3/uL (ref 150–400)
RBC: 4.21 MIL/uL (ref 3.87–5.11)
RDW: 18 % — ABNORMAL HIGH (ref 11.5–15.5)
WBC: 7.1 10*3/uL (ref 4.0–10.5)
nRBC: 0 % (ref 0.0–0.2)

## 2018-11-11 LAB — ROCKY MTN SPOTTED FVR ABS PNL(IGG+IGM)
RMSF IgG: NEGATIVE
RMSF IgM: 0.33 index (ref 0.00–0.89)

## 2018-11-11 NOTE — Plan of Care (Signed)
?  Problem: Elimination: ?Goal: Will not experience complications related to bowel motility ?Outcome: Progressing ?  ?Problem: Pain Managment: ?Goal: General experience of comfort will improve ?Outcome: Progressing ?  ?Problem: Safety: ?Goal: Ability to remain free from injury will improve ?Outcome: Progressing ?  ?

## 2018-11-11 NOTE — Progress Notes (Signed)
PROGRESS NOTE  Cassidy Reeves HUT:[id number] DOB: 26-Apr-1956 DOA: 11/09/2018 PCP: Haywood Pao, MD   LOS: 1 day   Brief narrative: Patient is a 63 year old Caucasian female with history of psoriatic arthritis on immunosuppression, osteoarthritis, gastric bypass, obesity, vitamin D deficiency who lives at home with her husband and is functionally active. Patient presented to the ED today with 1 week history of feverandgeneralized weakness. It is started Monday a week ago withfatigue.She was having waxing and waning chills and sweats, fever measured up to 101. Had generalized body aches. Occasional dry cough, no shortness of breath chest pain. No tick bite for muscular bite.No exposure to anybody with COVID 19. Patient states that she and her husband have essentially stayed inside the house to avoid exposure. She went to her primary care provider on Friday. Tested negative for COVID, but patient states the nasal swab was not a deep sample.She was started on Z-Pak which she took for 3 days. She did not feel any improvement in her symptoms. Her temperature this morning was still more than 100 and then she decided to come to ED. No burning urination, urine frequency or foul-smell.No shortness of breath, palpitation, meningismus, altered mentation, nausea, vomiting, diarrhea, abdominal pain, nonhealing wound. She does have psoriatic arthritis and has been on Cosentyx(monthly injection)for about a year. She has not had any bad reaction to it. She lives with some generalized joint and takes ibuprofen on a regular basis. She has not noticed any added swelling or pain ofany of her joints.   In the ED,she had a fever 100.5. Heart rate less than 100. Blood pressure 129/64 on arrival, low at 96/54 at the time of my evaluation.Breathing comfortably on room air. Labs showed WBC count slightly elevated to 11.6, hemoglobin 11.7, platelet 317. Sodium 137, potassium 3.4, BUN/creatinine  7/0.7, albumin of 3, lactic acid normal. Urinalysis clear yellow urine many bacteria, trace leukocytes protein of 30. COVID-19 test sent, has not resulted yet. Blood culture and urine culture sent. Chest x-ray did not show any acute cardiopulmonary disease.  Patient was admitted for UTI, generalized weakness.  Subjective: Patient was seen and examined this morning.  Sitting up in chair.  Not in distress.  Feels stronger now. Has been making a lot of urine. Chart reviewed.  No fever. Underwent echocardiogram yesterday, EF 60 to 65%, Urine culture showing more than 100,000 CFU per mL of E. coli, pending sensitivity.  Assessment/Plan:  Active Problems:   UTI (urinary tract infection)  E. coli UTI  -Presented with fever/malaise/generalized weakness -Symptoms ongoing for a week, did not respond to 3-day course of Z-Pak. -Urinalysis on admission with many bacteria and trace leukocytes.  Currently on IV Rocephin for UTI which we will continue.  -Urine culture showed E. coli more than 100,000 CFU per mL, pending sensitivity. -WBC count improved to normal.  No fever recurrence since admission. -Adequately hydrated.  Stop IV fluid today. -COVID-19 test negative.  -TSH level normal.   Abnormal echocardiogram -EF normal at 60 to 65% but echocardiogram also reads "a small area of the tricuspid valve that appears bright. Not well visualized on this study. Cannot determine if it is attached to valve or independently mobile. Cannot exclude vegetation.  Clinically correlate." -No fever, WBC not elevated.  Generalized weakness resolving.  Earlier the source of infection was unclear and hence echocardiogram was ordered.  But now, urine culture has now shown significant growth of E. coli and patient is improving on IV Rocephin.  Blood culture obtained on admission  did not show any growth.  I do not think the finding echocardiogram reflects a true tricuspid valve vegetation.  Moreover, patient is not an  IV drug user. Patient is advised to closely watch her symptoms.  If continues, she may need TEE for definite rule out of tricuspid valve endocarditis.  Psoriatic arthritis -Takes Cosentyx injection monthly. -Takes ibuprofen regularly scheduled. Knows she has to avoid taking iton empty stomach. Denies any history of renal impairment or stomach ulcers. Also recently started on Nexium for drop in hemoglobin, hemoglobin 10.2. --Elevated sed rate to 47 and CRP to 19,  which goes along with underlying psoriatic arthritis.  History of gastric bypass  Mobility: Encourage ambulation Diet: Regular diet DVT prophylaxis:  Lovenox subcu Code Status:   Code Status: Full Code  Family Communication:  Patient has been updating her family Expected Discharge:  Home likely tomorrow if urine culture sensitivity is back.  Consultants:  None  Procedures:  None  Antimicrobials:  Anti-infectives (From admission, onward)   Start     Dose/Rate Route Frequency Ordered Stop   11/10/18 1600  cefTRIAXone (ROCEPHIN) 1 g in sodium chloride 0.9 % 100 mL IVPB     1 g 200 mL/hr over 30 Minutes Intravenous Every 24 hours 11/09/18 1746     11/09/18 1530  cefTRIAXone (ROCEPHIN) 1 g in sodium chloride 0.9 % 100 mL IVPB     1 g 200 mL/hr over 30 Minutes Intravenous  Once 11/09/18 1528 11/09/18 1624      Infusions:  . cefTRIAXone (ROCEPHIN)  IV 1 g (11/10/18 1646)    Scheduled Meds: . DULoxetine  60 mg Oral Daily  . enoxaparin (LOVENOX) injection  40 mg Subcutaneous Q24H  . ferrous sulfate  325 mg Oral BID  . senna  1 tablet Oral BID  . sodium chloride flush  3 mL Intravenous Q12H    PRN meds: acetaminophen **OR** acetaminophen, ALPRAZolam, magnesium hydroxide, ondansetron **OR** ondansetron (ZOFRAN) IV, sodium phosphate, sorbitol, zolpidem   Objective: Vitals:   11/11/18 0443 11/11/18 0906  BP: 125/63 136/60  Pulse: 63 70  Resp: 18   Temp: 98.1 F (36.7 C) 98.1 F (36.7 C)  SpO2: 98% 96%     Intake/Output Summary (Last 24 hours) at 11/11/2018 1005 Last data filed at 11/11/2018 0800 Gross per 24 hour  Intake 1837.38 ml  Output 1600 ml  Net 237.38 ml   Filed Weights   11/09/18 1041 11/09/18 1959 11/10/18 1957  Weight: 106.6 kg 107 kg 106.9 kg   Weight change: 0.318 kg Body mass index is 38.04 kg/m.   Physical Exam: General exam: Appears calm and comfortable.  Skin: No rashes, lesions or ulcers. HEENT: Atraumatic, normocephalic, supple neck, no obvious bleeding Lungs: Clear to auscultation bilaterally CVS: Regular rate and rhythm, no murmur. GI/Abd soft, nontender, nondistended, bowel sound present CNS: Alert, awake, oriented x3 Psychiatry: Mood appropriate Extremities: No pedal edema, no calf tenderness  Data Review: I have personally reviewed the laboratory data and studies available.  Recent Labs  Lab 11/09/18 1050 11/10/18 0544 11/11/18 0505  WBC 11.6* 9.5 7.1  NEUTROABS 7.7 4.7 3.1  HGB 11.7* 10.2* 10.8*  HCT 37.7 32.6* 34.4*  MCV 81.3 81.1 81.7  PLT 317 268 316   Recent Labs  Lab 11/09/18 1050 11/10/18 0544 11/11/18 0505  NA 137 138 142  K 3.4* 3.6 3.7  CL 101 105 103  CO2 24 24 28   GLUCOSE 102* 93 89  BUN 7* 9 7*  CREATININE 0.70 0.64  0.74  CALCIUM 8.9 8.5* 8.8*    Terrilee Croak, MD  [facility] 11/11/2018

## 2018-11-11 NOTE — Plan of Care (Signed)

## 2018-11-12 DIAGNOSIS — Z9884 Bariatric surgery status: Secondary | ICD-10-CM

## 2018-11-12 DIAGNOSIS — R6510 Systemic inflammatory response syndrome (SIRS) of non-infectious origin without acute organ dysfunction: Secondary | ICD-10-CM

## 2018-11-12 LAB — BASIC METABOLIC PANEL
Anion gap: 10 (ref 5–15)
BUN: 10 mg/dL (ref 8–23)
CO2: 29 mmol/L (ref 22–32)
Calcium: 9.1 mg/dL (ref 8.9–10.3)
Chloride: 104 mmol/L (ref 98–111)
Creatinine, Ser: 0.8 mg/dL (ref 0.44–1.00)
GFR calc Af Amer: >60 mL/min (ref >60)
GFR calc non Af Amer: >60 mL/min (ref >60)
Glucose, Bld: 90 mg/dL (ref 70–99)
Potassium: 4.5 mmol/L (ref 3.5–5.1)
Sodium: 143 mmol/L (ref 135–145)

## 2018-11-12 LAB — CBC WITH DIFFERENTIAL/PLATELET
Abs Immature Granulocytes: 0.05 10*3/uL (ref 0.00–0.07)
Basophils Absolute: 0.1 10*3/uL (ref 0.0–0.1)
Basophils Relative: 1 %
Eosinophils Absolute: 0.5 10*3/uL (ref 0.0–0.5)
Eosinophils Relative: 7 %
HCT: 35.6 % — ABNORMAL LOW (ref 36.0–46.0)
Hemoglobin: 11.2 g/dL — ABNORMAL LOW (ref 12.0–15.0)
Immature Granulocytes: 1 %
Lymphocytes Relative: 37 %
Lymphs Abs: 2.8 10*3/uL (ref 0.7–4.0)
MCH: 25.7 pg — ABNORMAL LOW (ref 26.0–34.0)
MCHC: 31.5 g/dL (ref 30.0–36.0)
MCV: 81.7 fL (ref 80.0–100.0)
Monocytes Absolute: 0.9 10*3/uL (ref 0.1–1.0)
Monocytes Relative: 12 %
Neutro Abs: 3.2 10*3/uL (ref 1.7–7.7)
Neutrophils Relative %: 42 %
Platelets: 370 10*3/uL (ref 150–400)
RBC: 4.36 MIL/uL (ref 3.87–5.11)
RDW: 17.6 % — ABNORMAL HIGH (ref 11.5–15.5)
WBC: 7.6 10*3/uL (ref 4.0–10.5)
nRBC: 0 % (ref 0.0–0.2)

## 2018-11-12 MED ORDER — CEFDINIR 300 MG PO CAPS: 300.0000 mg | ORAL_CAPSULE | Freq: Two times a day (BID) | ORAL | 0 refills | Status: AC

## 2018-11-12 NOTE — Progress Notes (Deleted)
Office Visit Note  Patient: Cassidy Reeves             Date of Birth: 10/10/1955           MRN: [id number]             PCP: Haywood Pao, MD Referring: Haywood Pao, MD Visit Date: 11/25/2018 Occupation: @GUAROCC @  Subjective:  No chief complaint on file.   Cosentyx 300 mg every 28 days.  Last TB gold negative on 08/20/2018 and will monitor yearly.  Most recent BMP within normal limits on 11/12/2018.  Most recent CMP showed normal liver enzymes on 11/09/2018.  Most recent CBC showed anemia on 11/12/2018.  Due for CBC/CMP in September and will monitor every 3 months.  Standing orders are in place.  Recommend annual influenza, Pneumovax 23, Prevnar 13, and Shingrix as indicated for immunosuppressant therapy.    History of Present Illness: Cassidy Reeves is a 63 y.o. female ***   Activities of Daily Living:  Patient reports morning stiffness for *** {minute/hour:19697}.   Patient {ACTIONS;DENIES/REPORTS:21021675::"Denies"} nocturnal pain.  Difficulty dressing/grooming: {ACTIONS;DENIES/REPORTS:21021675::"Denies"} Difficulty climbing stairs: {ACTIONS;DENIES/REPORTS:21021675::"Denies"} Difficulty getting out of chair: {ACTIONS;DENIES/REPORTS:21021675::"Denies"} Difficulty using hands for taps, buttons, cutlery, and/or writing: {ACTIONS;DENIES/REPORTS:21021675::"Denies"}  No Rheumatology ROS completed.   PMFS History:  Patient Active Problem List   Diagnosis Date Noted  . UTI (urinary tract infection) 11/09/2018  . History of depression 05/22/2017  . Primary osteoarthritis of both hands 07/22/2016  . DDD lumbar spine 07/22/2016  . Trigger finger 07/10/2016  . Psoriatic arthropathy ([place]) 07/10/2016  . High risk medication use 06/13/2016  . Primary osteoarthritis of both knees 06/13/2016  . History of gastric bypass 06/13/2016  . Vitamin D deficiency 06/13/2016  . Psoriasis 02/18/2013  . 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  Anxiety   . Arthritis   . GERD (gastroesophageal reflux disease)   . History of kidney stones   . Pelvic mass   . UTI (urinary tract infection) 11/09/2018    Family History  Problem Relation Age of Onset  . Heart disease Father   . Arthritis Father   . Psoriasis Father   . Parkinsonism Father   . Other Father 52       pacemaker  . Colon cancer Paternal Grandfather   . Heart failure Paternal Grandmother   . Stroke Maternal Grandmother   . Esophageal cancer Neg Hx   . Rectal cancer Neg Hx   . Stomach cancer Neg Hx    Past Surgical History:  Procedure Laterality Date  . ABDOMINAL HYSTERECTOMY    . APPENDECTOMY    . BACK SURGERY    . CHOLECYSTECTOMY    . EXPLORATORY LAPAROTOMY    . GASTRIC BYPASS    . KNEE ARTHROSCOPY    . LITHOTRIPSY     Social History   Social History Narrative   Daily caffeine     There is no immunization history on file for this patient.   Objective: Vital Signs: There were no vitals taken for this visit.   Physical Exam   Musculoskeletal Exam: ***  CDAI Exam: CDAI Score: - Patient Global: -; Provider Global: - Swollen: -; Tender: - Joint Exam   No joint exam has been documented for this visit   There is currently no information documented on the homunculus. Go to the Rheumatology activity and complete the homunculus joint exam.  Investigation: No additional findings.  Imaging: Dg Chest 2  View  Result Date: 11/09/2018 CLINICAL DATA:  Fever for 1 week. Very weak. EXAM: CHEST - 2 VIEW COMPARISON:  11/27/2015. FINDINGS: The heart size and mediastinal contours are within normal limits. Both lungs are clear. The visualized skeletal structures are unremarkable. Stable scarring LEFT base. IMPRESSION: No active cardiopulmonary disease. Electronically Signed   By: Staci Righter M.D.   On: 11/09/2018 11:09    Recent Labs: Lab Results  Component Value Date   WBC 7.6 11/12/2018   HGB 11.2 (L) 11/12/2018   PLT 370 11/12/2018   NA 143  11/12/2018   K 4.5 11/12/2018   CL 104 11/12/2018   CO2 29 11/12/2018   GLUCOSE 90 11/12/2018   BUN 10 11/12/2018   CREATININE 0.80 11/12/2018   BILITOT 0.4 11/09/2018   ALKPHOS 94 11/09/2018   AST 20 11/09/2018   ALT 29 11/09/2018   PROT 7.0 11/09/2018   ALBUMIN 3.0 (L) 11/09/2018   CALCIUM 9.1 11/12/2018   GFRAA >60 11/12/2018   QFTBGOLDPLUS NEGATIVE 08/20/2018    Speciality Comments: No specialty comments available.  Procedures:  No procedures performed Allergies: Dilaudid [hydromorphone hcl] and Penicillins   Assessment / Plan:     Visit Diagnoses: No diagnosis found.   Orders: No orders of the defined types were placed in this encounter.  No orders of the defined types were placed in this encounter.   Face-to-face time spent with patient was *** minutes. Greater than 50% of time was spent in counseling and coordination of care.  Follow-Up Instructions: No follow-ups on file.   Earnestine Mealing, CMA  Note - This record has been created using Editor, commissioning.  Chart creation errors have been sought, but may not always  have been located. Such creation errors do not reflect on  the standard of medical care.

## 2018-11-12 NOTE — Discharge Summary (Addendum)
Physician Discharge Summary  Cassidy Reeves ZOX:[id number] DOB: 08-02-1955 DOA: 11/09/2018  PCP: Haywood Pao, MD  Admit date: 11/09/2018 Discharge date: 11/12/2018 Consultations: None Admitted From: Home Disposition: Home  Discharge Diagnoses:  Active Problems:   UTI (urinary tract infection) Systemic inflammatory response syndrome: Present on admission Psoriatic arthritis on chronic immunosuppression Osteoarthritis Status post gastric bypass surgery Vitamin D deficiency  Brief/Interim Summary: 63 year old female with psoriatic arthritis on immunosuppressants, obesity s/p gastric bypass surgery, vitamin D deficiency who lives at home with her husband presented with complaints of generalized weakness/fatigue and fever (T-max 101F) with chills.She went to her PCP for further evaluation and tested negative for COVID-19.  She was started on Z-Pak as outpatient but her symptoms did not improve when she presented to the ED.  In the ED, COVID testing was repeated which was negative.She does have psoriatic arthritis and has been on Cosentyx(monthly injection)for about a year.  In the ED she had low-grade temp of 100. 74F, hypotension with blood pressure 96/54 during , WBC 11.6 but normal lactic acid.  BMP showed normal renal function, UA showed many bacteria.  Chest x-ray was negative for any acute infiltrates.  Patient was admitted to medical service.  Blood and urine cultures were sent.  Blood cultures were negative but urine cultures grew E. coli greater than 100,000 colony-forming units.  Sensitivity pattern today shows resistance to ampicillin and Bactrim.  She feels improved with IV ceftriaxone and will be discharged with oral cefdinir to complete antibiotic course as outpatient.  She has been afebrile in the last 24 hours.  Of note patient did undergo 2D echo in the initial hospital course showing EF of 60 to 65% and also a vague finding of "small area of the tricuspid valve that appears  bright. Not well visualized on this study. Cannot determine if it is attached to valve or independently mobile. Cannot exclude vegetation.  Clinically correlate".  Since blood cultures are negative and she has responded well with treatment for UTI, doubt endocarditis at this point.  Patient is advised to return to the ED if she develops chest pain/shortness of breath or fever after completion of antibiotic course in which case, TEE would be considered.She does have elevated ESR and C-reactive protein which are likely due to underlying psoriatic arthritis.  She is on chronic immunosuppressants.  She is advised to follow-up with PCP closely.  Repeat blood cultures can be considered in 1 week for assurance.  Discharge Exam: Vitals:   11/12/18 0408 11/12/18 0926  BP: 129/66 (!) 134/59  Pulse: 62 68  Resp: 12   Temp: 97.7 F (36.5 C) 97.7 F (36.5 C)  SpO2: 99% 98%   Vitals:   11/11/18 1757 11/11/18 2214 11/12/18 0408 11/12/18 0926  BP: (!) 122/57 134/69 129/66 (!) 134/59  Pulse: 73 68 62 68  Resp:  12 12   Temp: 98.2 F (36.8 C) 98.6 F (37 C) 97.7 F (36.5 C) 97.7 F (36.5 C)  TempSrc: Oral Oral Oral Oral  SpO2: 98% 98% 99% 98%  Weight:   105.6 kg   Height:        General: Pt is alert, awake, not in acute distress Cardiovascular: RRR, S1/S2 +, no rubs, no gallops Respiratory: CTA bilaterally, no wheezing, no rhonchi Abdominal: Soft, NT, ND, bowel sounds + Extremities: no edema, no cyanosis  Discharge Instructions  Discharge Instructions    Call MD for:  hives   Complete by: As directed    Call MD for:  persistant  nausea and vomiting   Complete by: As directed    Call MD for:  temperature >100.4   Complete by: As directed    Diet - low sodium heart healthy   Complete by: As directed    Increase activity slowly   Complete by: As directed      Allergies as of 11/12/2018      Reactions   Dilaudid [hydromorphone Hcl]    Crazy hallucinations per the pt   Penicillins     Breaks out and itches per the pt Did it involve swelling of the face/tongue/throat, SOB, or low BP? Unknown Did it involve sudden or severe rash/hives, skin peeling, or any reaction on the inside of your mouth or nose? Unknown Did you need to seek medical attention at a hospital or doctor's office? Unknown When did it last happen? childhood If all above answers are "NO", may proceed with cephalosporin use.      Medication List    STOP taking these medications   azithromycin 250 MG tablet Commonly known as: ZITHROMAX   ibuprofen 200 MG tablet Commonly known as: ADVIL     TAKE these medications   acetaminophen 500 MG tablet Commonly known as: TYLENOL Take 1,000 mg by mouth every 6 (six) hours as needed for mild pain, fever or headache.   ALPRAZolam 0.5 MG tablet Commonly known as: XANAX Take 0.5 mg by mouth daily as needed for anxiety.   cefdinir 300 MG capsule Commonly known as: OMNICEF Take 1 capsule (300 mg total) by mouth 2 (two) times daily for 5 days.   clobetasol cream 0.05 % Commonly known as: TEMOVATE Apply 1 application topically daily as needed (psorisis on elbows).   DULoxetine 60 MG capsule Commonly known as: CYMBALTA Take 60 mg by mouth daily.   estradiol 0.05 MG/24HR patch Commonly known as: VIVELLE-DOT Place 1 patch onto the skin 2 (two) times a week.   ferrous sulfate 325 (65 FE) MG tablet Take 325 mg by mouth 2 (two) times daily.   hydrOXYzine 25 MG tablet Commonly known as: ATARAX/VISTARIL Take 25 mg by mouth 3 (three) times daily as needed for itching.   OMEGA 3 PO Take 2,000 mg by mouth daily.   Secukinumab 150 MG/ML Soaj Inject 300 mg into the skin every 30 (thirty) days.   TURMERIC PO Take 1 capsule by mouth daily.   VITAMIN D PO Take 1 tablet by mouth daily.   zolpidem 10 MG tablet Commonly known as: AMBIEN Take 10 mg by mouth at bedtime as needed for sleep. At bedtime as needed       Allergies  Allergen Reactions  .  Dilaudid [Hydromorphone Hcl]     Crazy hallucinations per the pt  . Penicillins     Breaks out and itches per the pt Did it involve swelling of the face/tongue/throat, SOB, or low BP? Unknown Did it involve sudden or severe rash/hives, skin peeling, or any reaction on the inside of your mouth or nose? Unknown Did you need to seek medical attention at a hospital or doctor's office? Unknown When did it last happen? childhood If all above answers are "NO", may proceed with cephalosporin use.        Discharge Condition: Stable CODE STATUS: Full code Diet recommendation: 2 g sodium diet Recommendations for Outpatient Follow-up:  1. Follow up with PCP in 1-2 weeks 2. Please obtain BMP/CBC in one week 3. Please follow up on the following: Repeat blood cultures through PCP after completion of antibiotic  course.     The results of significant diagnostics from this hospitalization (including imaging, microbiology, ancillary and laboratory) are listed below for reference.     Microbiology: Recent Results (from the past 240 hour(s))  Urine culture     Status: Abnormal   Collection Time: 11/09/18 12:53 PM   Specimen: Urine, Random  Result Value Ref Range Status   Specimen Description URINE, RANDOM  Final   Special Requests   Final    NONE Performed at [facility], [address] [place], [place] [address]    Culture >=100,000 COLONIES/mL ESCHERICHIA COLI (A)  Final   Report Status 11/11/2018 FINAL  Final   Organism ID, Bacteria ESCHERICHIA COLI (A)  Final      Susceptibility   Escherichia coli - MIC*    AMPICILLIN >=32 RESISTANT Resistant     CEFAZOLIN <=4 SENSITIVE Sensitive     CEFTRIAXONE <=1 SENSITIVE Sensitive     CIPROFLOXACIN <=0.25 SENSITIVE Sensitive     GENTAMICIN <=1 SENSITIVE Sensitive     IMIPENEM <=0.25 SENSITIVE Sensitive     NITROFURANTOIN <=16 SENSITIVE Sensitive     TRIMETH/SULFA >=320 RESISTANT Resistant     AMPICILLIN/SULBACTAM 16 INTERMEDIATE  Intermediate     PIP/TAZO <=4 SENSITIVE Sensitive     Extended ESBL NEGATIVE Sensitive     * >=100,000 COLONIES/mL ESCHERICHIA COLI  Culture, blood (routine x 2)     Status: None (Preliminary result)   Collection Time: 11/09/18  1:36 PM   Specimen: BLOOD  Result Value Ref Range Status   Specimen Description BLOOD LEFT ANTECUBITAL  Final   Special Requests   Final    BOTTLES DRAWN AEROBIC AND ANAEROBIC Blood Culture adequate volume   Culture   Final    NO GROWTH 3 DAYS Performed at [facility], [address], [place], [place] [address]    Report Status PENDING  Incomplete  Culture, blood (routine x 2)     Status: None (Preliminary result)   Collection Time: 11/09/18  2:36 PM   Specimen: BLOOD  Result Value Ref Range Status   Specimen Description BLOOD RIGHT ANTECUBITAL  Final   Special Requests   Final    BOTTLES DRAWN AEROBIC AND ANAEROBIC Blood Culture results may not be optimal due to an excessive volume of blood received in culture bottles   Culture   Final    NO GROWTH 3 DAYS Performed at [facility], [place] [address] [place],  [address]    Report Status PENDING  Incomplete  SARS Coronavirus 2 (CEPHEID- Performed in [place] hospital lab), Hosp Order     Status: None   Collection Time: 11/09/18  3:29 PM   Specimen: Nasopharyngeal Swab  Result Value Ref Range Status   SARS Coronavirus 2 NEGATIVE NEGATIVE Final    Comment: (NOTE) If result is NEGATIVE SARS-CoV-2 target nucleic acids are NOT DETECTED. The SARS-CoV-2 RNA is generally detectable in upper and lower  respiratory specimens during the acute phase of infection. The lowest  concentration of SARS-CoV-2 viral copies this assay can detect is 250  copies / mL. A negative result does not preclude SARS-CoV-2 infection  and should not be used as the sole basis for treatment or other  patient management decisions.  A negative result may occur with  improper specimen collection / handling, submission  of specimen other  than nasopharyngeal swab, presence of viral mutation(s) within the  areas targeted by this assay, and inadequate number of viral copies  (<  250 copies / mL). A negative result must be combined with clinical  observations, patient history, and epidemiological information. If result is POSITIVE SARS-CoV-2 target nucleic acids are DETECTED. The SARS-CoV-2 RNA is generally detectable in upper and lower  respiratory specimens dur ing the acute phase of infection.  Positive  results are indicative of active infection with SARS-CoV-2.  Clinical  correlation with patient history and other diagnostic information is  necessary to determine patient infection status.  Positive results do  not rule out bacterial infection or co-infection with other viruses. If result is PRESUMPTIVE POSTIVE SARS-CoV-2 nucleic acids MAY BE PRESENT.   A presumptive positive result was obtained on the submitted specimen  and confirmed on repeat testing.  While 2019 novel coronavirus  (SARS-CoV-2) nucleic acids may be present in the submitted sample  additional confirmatory testing may be necessary for epidemiological  and / or clinical management purposes  to differentiate between  SARS-CoV-2 and other Sarbecovirus currently known to infect humans.  If clinically indicated additional testing with an alternate test  methodology [phone]) is advised. The SARS-CoV-2 RNA is generally  detectable in upper and lower respiratory sp ecimens during the acute  phase of infection. The expected result is Negative. Fact Sheet for Patients:  StrictlyIdeas.no Fact Sheet for Healthcare Providers: BankingDealers.co.za This test is not yet approved or cleared by the [place] FDA and has been authorized for detection and/or diagnosis of SARS-CoV-2 by FDA under an Emergency Use Authorization (EUA).  This EUA will remain in effect (meaning this test can be used) for  the duration of the COVID-19 declaration under Section 564(b)(1) of the Act, 21 U.S.C. section 360bbb-3(b)(1), unless the authorization is terminated or revoked sooner. Performed at [facility], [place] [address], [place], [place] [address]      Labs: BNP (last 3 results) No results for input(s): BNP in the last 8760 hours. Basic Metabolic Panel: Recent Labs  Lab 11/09/18 1050 11/10/18 0544 11/11/18 0505 11/12/18 0514  NA 137 138 142 143  K 3.4* 3.6 3.7 4.5  CL 101 105 103 104  CO2 _0 GLUCOSE 102* 93 89 90  BUN 7* 9 7* 10  CREATININE 0.70 0.64 0.74 0.80  CALCIUM 8.9 8.5* 8.8* 9.1   Liver Function Tests: Recent Labs  Lab 11/09/18 1050  AST 20  ALT 29  ALKPHOS 94  BILITOT 0.4  PROT 7.0  ALBUMIN 3.0*   No results for input(s): LIPASE, AMYLASE in the last 168 hours. No results for input(s): AMMONIA in the last 168 hours. CBC: Recent Labs  Lab 11/09/18 1050 11/10/18 0544 11/11/18 0505 11/12/18 0514  WBC 11.6* 9.5 7.1 7.6  NEUTROABS 7.7 4.7 3.1 3.2  HGB 11.7* 10.2* 10.8* 11.2*  HCT 37.7 32.6* 34.4* 35.6*  MCV 81.3 81.1 81.7 81.7  PLT 317 268 316 370   Cardiac Enzymes: No results for input(s): CKTOTAL, CKMB, CKMBINDEX, TROPONINI in the last 168 hours. BNP: Invalid input(s): POCBNP CBG: No results for input(s): GLUCAP in the last 168 hours. D-Dimer No results for input(s): DDIMER in the last 72 hours. Hgb A1c No results for input(s): HGBA1C in the last 72 hours. Lipid Profile No results for input(s): CHOL, HDL, LDLCALC, TRIG, CHOLHDL, LDLDIRECT in the last 72 hours. Thyroid function studies Recent Labs    11/10/18 1126  TSH 2.769   Anemia work up No results for input(s): VITAMINB12, FOLATE, FERRITIN, TIBC, IRON, RETICCTPCT in the last 72 hours. Urinalysis    Component Value  Date/Time   COLORURINE YELLOW 11/09/2018 1046   APPEARANCEUR CLEAR 11/09/2018 1046   LABSPEC 1.010 11/09/2018 1046   PHURINE 6.0 11/09/2018 1046   GLUCOSEU  NEGATIVE 11/09/2018 1046   HGBUR SMALL (A) 11/09/2018 [place] 11/09/2018 [place] 11/09/2018 1046   PROTEINUR 30 (A) 11/09/2018 1046   NITRITE NEGATIVE 11/09/2018 1046   LEUKOCYTESUR TRACE (A) 11/09/2018 1046   Sepsis Labs Invalid input(s): PROCALCITONIN,  WBC,  LACTICIDVEN Microbiology Recent Results (from the past 240 hour(s))  Urine culture     Status: Abnormal   Collection Time: 11/09/18 12:53 PM   Specimen: Urine, Random  Result Value Ref Range Status   Specimen Description URINE, RANDOM  Final   Special Requests   Final    NONE Performed at [facility], [place] [address], [place], [place] [address]    Culture >=100,000 COLONIES/mL ESCHERICHIA COLI (A)  Final   Report Status 11/11/2018 FINAL  Final   Organism ID, Bacteria ESCHERICHIA COLI (A)  Final      Susceptibility   Escherichia coli - MIC*    AMPICILLIN >=32 RESISTANT Resistant     CEFAZOLIN <=4 SENSITIVE Sensitive     CEFTRIAXONE <=1 SENSITIVE Sensitive     CIPROFLOXACIN <=0.25 SENSITIVE Sensitive     GENTAMICIN <=1 SENSITIVE Sensitive     IMIPENEM <=0.25 SENSITIVE Sensitive     NITROFURANTOIN <=16 SENSITIVE Sensitive     TRIMETH/SULFA >=320 RESISTANT Resistant     AMPICILLIN/SULBACTAM 16 INTERMEDIATE Intermediate     PIP/TAZO <=4 SENSITIVE Sensitive     Extended ESBL NEGATIVE Sensitive     * >=100,000 COLONIES/mL ESCHERICHIA COLI  Culture, blood (routine x 2)     Status: None (Preliminary result)   Collection Time: 11/09/18  1:36 PM   Specimen: BLOOD  Result Value Ref Range Status   Specimen Description BLOOD LEFT ANTECUBITAL  Final   Special Requests   Final    BOTTLES DRAWN AEROBIC AND ANAEROBIC Blood Culture adequate volume   Culture   Final    NO GROWTH 3 DAYS Performed at [facility], [address], [place], [place] [address]    Report Status PENDING  Incomplete  Culture, blood (routine x 2)     Status: None (Preliminary result)   Collection Time:  11/09/18  2:36 PM   Specimen: BLOOD  Result Value Ref Range Status   Specimen Description BLOOD RIGHT ANTECUBITAL  Final   Special Requests   Final    BOTTLES DRAWN AEROBIC AND ANAEROBIC Blood Culture results may not be optimal due to an excessive volume of blood received in culture bottles   Culture   Final    NO GROWTH 3 DAYS Performed at [facility], [place] [address] [place], [place] [address]    Report Status PENDING  Incomplete  SARS Coronavirus 2 (CEPHEID- Performed in [place] hospital lab), Hosp Order     Status: None   Collection Time: 11/09/18  3:29 PM   Specimen: Nasopharyngeal Swab  Result Value Ref Range Status   SARS Coronavirus 2 NEGATIVE NEGATIVE Final    Comment: (NOTE) If result is NEGATIVE SARS-CoV-2 target nucleic acids are NOT DETECTED. The SARS-CoV-2 RNA is generally detectable in upper and lower  respiratory specimens during the acute phase of infection. The lowest  concentration of SARS-CoV-2 viral copies this assay can detect is 250  copies / mL. A negative result does not preclude SARS-CoV-2 infection  and should not  be used as the sole basis for treatment or other  patient management decisions.  A negative result may occur with  improper specimen collection / handling, submission of specimen other  than nasopharyngeal swab, presence of viral mutation(s) within the  areas targeted by this assay, and inadequate number of viral copies  (<250 copies / mL). A negative result must be combined with clinical  observations, patient history, and epidemiological information. If result is POSITIVE SARS-CoV-2 target nucleic acids are DETECTED. The SARS-CoV-2 RNA is generally detectable in upper and lower  respiratory specimens dur ing the acute phase of infection.  Positive  results are indicative of active infection with SARS-CoV-2.  Clinical  correlation with patient history and other diagnostic information is  necessary to determine patient infection  status.  Positive results do  not rule out bacterial infection or co-infection with other viruses. If result is PRESUMPTIVE POSTIVE SARS-CoV-2 nucleic acids MAY BE PRESENT.   A presumptive positive result was obtained on the submitted specimen  and confirmed on repeat testing.  While 2019 novel coronavirus  (SARS-CoV-2) nucleic acids may be present in the submitted sample  additional confirmatory testing may be necessary for epidemiological  and / or clinical management purposes  to differentiate between  SARS-CoV-2 and other Sarbecovirus currently known to infect humans.  If clinically indicated additional testing with an alternate test  methodology [phone]) is advised. The SARS-CoV-2 RNA is generally  detectable in upper and lower respiratory sp ecimens during the acute  phase of infection. The expected result is Negative. Fact Sheet for Patients:  StrictlyIdeas.no Fact Sheet for Healthcare Providers: BankingDealers.co.za This test is not yet approved or cleared by the [place] FDA and has been authorized for detection and/or diagnosis of SARS-CoV-2 by FDA under an Emergency Use Authorization (EUA).  This EUA will remain in effect (meaning this test can be used) for the duration of the COVID-19 declaration under Section 564(b)(1) of the Act, 21 U.S.C. section 360bbb-3(b)(1), unless the authorization is terminated or revoked sooner. Performed at [facility], [place] [address], [place], [place] [address]     Procedures/Studies: Dg Chest 2 View  Result Date: 11/09/2018 CLINICAL DATA:  Fever for 1 week. Very weak. EXAM: CHEST - 2 VIEW COMPARISON:  11/27/2015. FINDINGS: The heart size and mediastinal contours are within normal limits. Both lungs are clear. The visualized skeletal structures are unremarkable. Stable scarring LEFT base. IMPRESSION: No active cardiopulmonary disease. Electronically Signed   By: Staci Righter M.D.    On: 11/09/2018 11:09    (Echo, Carotid, EGD, Colonoscopy, ERCP)  Time coordinating discharge: Over 30 minutes  SIGNED:   Guilford Shi, MD  [facility] 11/12/2018, 3:51 PM Pager   If 7PM-7AM, please contact night-coverage [email or web address] Password [email or web address]

## 2018-11-12 NOTE — Progress Notes (Signed)
DISCHARGE NOTE HOME Vicky Schleich to be discharged Home per MD order. Discussed prescriptions and follow up appointments with the patient. Prescriptions given to patient; medication list explained in detail. Patient verbalized understanding.  Skin clean, dry and intact without evidence of skin break down, no evidence of skin tears noted. IV catheter discontinued intact. Site without signs and symptoms of complications. Dressing and pressure applied. Pt denies pain at the site currently. No complaints noted.  Patient free of lines, drains, and wounds.   An After Visit Summary (AVS) was printed and given to the patient. Patient escorted via wheelchair, and discharged home via private auto.  Orville Govern, RN

## 2018-11-12 NOTE — Plan of Care (Signed)

## 2018-11-14 LAB — CULTURE, BLOOD (ROUTINE X 2)
Culture: NO GROWTH
Culture: NO GROWTH
Special Requests: ADEQUATE

## 2018-11-25 ENCOUNTER — Ambulatory Visit: Payer: Self-pay | Admitting: Physician Assistant

## 2018-11-27 ENCOUNTER — Other Ambulatory Visit: Payer: Self-pay | Admitting: *Deleted

## 2018-11-27 MED ORDER — SECUKINUMAB 150 MG/ML ~~LOC~~ SOAJ: 300.0000 mg | SUBCUTANEOUS | 0 refills | Status: DC

## 2018-11-27 NOTE — Telephone Encounter (Signed)
Last Visit: 09/01/18 Next Visit: 12/14/18 Labs: 11/12/18 Hgb 11.2, Hct 35.6, MCH 25.7, RDW 17.6 TB Gold: 08/20/18 Neg  Okay to refill per Dr. Estanislado Pandy

## 2018-11-30 NOTE — Progress Notes (Signed)
Office Visit Note  Patient: Cassidy Reeves             Date of Birth: 1956-05-04           MRN: [id number]             PCP: Haywood Pao, MD Referring: Haywood Pao, MD Visit Date: 12/14/2018 Occupation: @GUAROCC @  Subjective:  Morning stiffness   History of Present Illness: Fleda Pagel is a 63 y.o. female with history of psoriatic arthritis, osteoarthritis, and DDD. She is on Cosentyx 300 mg sq once monthly.  She has not missed any doses of Cosentyx recently.  She was admitted to [facility] for 4 days starting on 622 and 20 for the treatment of acute cystitis myalgias and fever.  She tested negative for coronavirus.  She was discharged on antibiotics and followed up with her PCP after discharge.  She did not miss any doses of Cosentyx during that time.  She is due for her next Cosentyx injection on 6 12/23/2018.  She states that she does experience himself stiffness and achiness about 1 week prior to her injections.  She has chronic pain in the left knee joint and some intermittent swelling.  She has tried Radio producer and cortisone injections in the past.  She states that the pain is most severe when going up steps.  She denies any other joint pain or joint swelling at this time.  She denies any SI joint pain.  She denies any Achilles tendinitis or plantar fasciitis.  She has no psoriasis at this time.  She does not need a refill of clobetasol currently.  She would like a refill of Voltaren gel which she applies topically to her left knee joint.     Activities of Daily Living:  Patient reports morning stiffness for 45-60 minutes.   Patient Denies nocturnal pain.  Difficulty dressing/grooming: Denies Difficulty climbing stairs: Reports Difficulty getting out of chair: Denies Difficulty using hands for taps, buttons, cutlery, and/or writing: Denies  Review of Systems  Constitutional: Positive for fatigue.  HENT: Negative for mouth sores, mouth dryness and nose dryness.   Eyes: Negative  for pain, visual disturbance and dryness.  Respiratory: Negative for cough, hemoptysis, shortness of breath and difficulty breathing.   Cardiovascular: Negative for chest pain, palpitations, hypertension and swelling in legs/feet.  Gastrointestinal: Positive for constipation (SE of po iron ). Negative for blood in stool and diarrhea.  Endocrine: Negative for increased urination.  Genitourinary: Negative for painful urination.  Musculoskeletal: Positive for arthralgias, joint pain and morning stiffness. Negative for joint swelling, myalgias, muscle weakness, muscle tenderness and myalgias.  Skin: Negative for color change, pallor, rash, hair loss, nodules/bumps, skin tightness, ulcers and sensitivity to sunlight.  Allergic/Immunologic: Negative for susceptible to infections.  Neurological: Negative for dizziness, numbness, headaches and weakness.  Hematological: Negative for swollen glands.  Psychiatric/Behavioral: Negative for depressed mood and sleep disturbance (Ambien). The patient is not nervous/anxious.     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  Past Medical History:  Diagnosis Date   Anxiety    Arthritis    GERD (gastroesophageal reflux disease)    History of kidney stones    Pelvic mass    UTI (urinary tract infection) 11/09/2018    Family History  Problem Relation Age of Onset   Heart disease Father    Arthritis Father    Psoriasis Father    Parkinsonism Father     Other Father 69       pacemaker   Colon cancer Paternal Grandfather    Heart failure Paternal Grandmother    Stroke Maternal Grandmother    Esophageal cancer Neg Hx    Rectal cancer Neg Hx    Stomach cancer Neg Hx    Past Surgical History:  Procedure Laterality Date   ABDOMINAL HYSTERECTOMY     APPENDECTOMY     BACK SURGERY     CHOLECYSTECTOMY     EXPLORATORY LAPAROTOMY     GASTRIC BYPASS     KNEE ARTHROSCOPY     LITHOTRIPSY     Social History   Social History Narrative   Daily caffeine     There is no immunization history on file for this patient.   Objective: Vital Signs: BP (!) 154/84 (BP Location: Left Wrist, Patient Position: Sitting, Cuff Size: Normal)    Pulse 68    Resp 13    Ht 5\' 6"  (1.676 m)    Wt 239 lb 3.2 oz (108.5 kg)    BMI 38.61 kg/m    Physical Exam  Constitutional: She is oriented to person, place, and time and well-developed, well-nourished, and in no distress.  HENT:  Head: Normocephalic and atraumatic.  Eyes: Conjunctivae are normal.  Pulmonary/Chest: Effort normal.  Neurological: She is alert and oriented to person, place, and time.  Skin:  No psoriasis or nail pitting noted.  Psychiatric: Mood, memory, affect and judgment normal.    Musculoskeletal Exam: C-spine, thoracic spine, lumbar spine good range of motion.  No midline spinal tenderness.  No SI joint tenderness.  Shoulder joints, elbows, wrist joints, MCPs, PIPs and DIPs good range of motion no synovitis.  She has PIP and DIP synovial thickening consistent with osteoarthritis of bilateral hands.  Hip joints have good range of motion with some discomfort.  No tenderness over trochanter bursa bilaterally.  The left knee has warmth but no effusion.  Right knee has good range of motion no warmth or effusion.  Ankle joints, MTPs, PIPs, DIPs good range of motion no synovitis.  No warmth or effusion bilateral ankle joints.  No tenderness of MTP or PIP joints.  No Achilles tendinitis or  plantar fasciitis.  CDAI Exam: CDAI Score: 1  Patient Global: 5 mm; Provider Global: 5 mm Swollen: 0 ; Tender: 0  Joint Exam   No joint exam has been documented for this visit   There is currently no information documented on the homunculus. Go to the Rheumatology activity and complete the homunculus joint exam.  Investigation: No additional findings.  Imaging: No results found.  Recent Labs: Lab Results  Component Value Date   WBC 7.6 11/12/2018   HGB 11.2 (L) 11/12/2018   PLT 370 11/12/2018   NA 143 11/12/2018   K 4.5 11/12/2018   CL 104 11/12/2018   CO2 29 11/12/2018   GLUCOSE 90 11/12/2018   BUN 10 11/12/2018   CREATININE 0.80 11/12/2018   BILITOT 0.4 11/09/2018   ALKPHOS 94 11/09/2018   AST 20 11/09/2018   ALT 29  11/09/2018   PROT 7.0 11/09/2018   ALBUMIN 3.0 (L) 11/09/2018   CALCIUM 9.1 11/12/2018   GFRAA >60 11/12/2018   QFTBGOLDPLUS NEGATIVE 08/20/2018    Speciality Comments: No specialty comments available.  Procedures:  No procedures performed Allergies: Dilaudid [hydromorphone hcl] and Penicillins   Assessment / Plan:     Visit Diagnoses: Psoriatic arthropathy ([place]) - She has no synovitis or dactylitis on exam.  She presents today with left knee joint pain and warmth.  She has no other joint pain or joint swelling at this time.  She has no Achilles tendinitis or plantar fasciitis.  She has no tenderness of the SI joints on exam.  She has no active psoriasis at this time.  She does not need a refill of clobetasol at this time.  She was given a refill Voltaren gel which she can buy topically to the left knee joint 4 times daily as needed.  We also discussed icing and elevating the left knee and she experiences increased discomfort.  She declined a left knee joint cortisone junction today.  She is clinically doing well on Cosentyx 3 mg subcutaneous injections once monthly.  She has not missed any doses of Cosentyx recently.  She will continue on his current  treatment regimen.  She is advised to notify us if she does increase joint pain or joint swelling.  She was advised to follow-up in the office in 5 months.  Psoriasis - She has no active psoriasis at this time.  She does not need a refill of clobetasol cream at this time.  High risk medication use - She tried spacing Cosentyx 150 mg every 14 days but noticed her psoriasis worsened so she resumed injecting Cosentyx 300 mg sq once monthly.  She has not missed any doses of Cosentyx recently.  Her next injection is on 12/23/2018.  We discussed the importance of holding Cosentyx anytime she develop signs or symptoms of an infection and to resume once the infection is completely cleared.  She was hospitalized on 11/09/2018 for the treatment of acute cystitis, myalgias, and fever.  She is advised to notify us if she develops recurrent infections.  We discussed the importance of social distancing and following standard precautions recommended by the CDC.  TB gold was negative on 08/20/2018.  CBC and BMP were updated in June 2020.  She will be due for lab work in September and every 3 months to monitor for drug toxicity.  Standing orders are in place.  Primary osteoarthritis of both hands - She has PIP and DIP synovial thickening consistent with osteoarthritis of bilateral hands.  She has no tenderness or synovitis.  She has complete fist formation bilaterally.  She experiences stiffness in both hands first thing in the morning for about 45 minutes.  Joint protection and muscle strengthening were discussed.  Primary osteoarthritis of both knees - She has good range of motion of bilateral knee joints.  She has chronic left knee joint pain.  She has warmth of the left knee but no effusion on exam.  She declined a left knee cortisone injection today.  She was advised to apply Voltaren gel topically as needed 4 times daily and to apply ice and elevate her left knee.  DDD lumbar spine -She has good range of motion with no  discomfort at this time.  No midline spinal tenderness.  No symptoms of radiculopathy.  Vitamin D deficiency - She is taking a vitamin D supplement.   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  Meds ordered this encounter  Medications   diclofenac sodium (VOLTAREN) 1 % GEL    Sig: Apply 2 to 4 g to affected area up to 4 times daily as needed.    Dispense:  350 g    Refill:  4    Face-to-face time spent with patient was 30 minutes. Greater than 50% of time was spent in counseling and coordination of care.  Follow-Up Instructions: Return in about 5 months (around 05/16/2019) for Psoriatic arthritis, Osteoarthritis, DDD.   Ofilia Neas, PA-C  Note - This record has been created using Dragon software.  Chart creation errors have been sought, but may not always  have been located. Such creation errors do not reflect on  the standard of medical care.

## 2018-12-14 ENCOUNTER — Ambulatory Visit: Payer: Managed Care, Other (non HMO) | Admitting: Physician Assistant

## 2018-12-14 ENCOUNTER — Other Ambulatory Visit: Payer: Self-pay

## 2018-12-14 ENCOUNTER — Encounter: Payer: Self-pay | Admitting: Physician Assistant

## 2018-12-14 VITALS — BP 154/84 | HR 68 | Resp 13 | Ht 66.0 in | Wt 239.2 lb

## 2018-12-14 DIAGNOSIS — L409 Psoriasis, unspecified: Secondary | ICD-10-CM

## 2018-12-14 DIAGNOSIS — M19041 Primary osteoarthritis, right hand: Secondary | ICD-10-CM

## 2018-12-14 DIAGNOSIS — L4050 Arthropathic psoriasis, unspecified: Secondary | ICD-10-CM | POA: Diagnosis not present

## 2018-12-14 DIAGNOSIS — Z79899 Other long term (current) drug therapy: Secondary | ICD-10-CM | POA: Diagnosis not present

## 2018-12-14 DIAGNOSIS — Z9884 Bariatric surgery status: Secondary | ICD-10-CM

## 2018-12-14 DIAGNOSIS — E559 Vitamin D deficiency, unspecified: Secondary | ICD-10-CM

## 2018-12-14 DIAGNOSIS — Z8719 Personal history of other diseases of the digestive system: Secondary | ICD-10-CM

## 2018-12-14 DIAGNOSIS — M19042 Primary osteoarthritis, left hand: Secondary | ICD-10-CM

## 2018-12-14 DIAGNOSIS — M170 Bilateral primary osteoarthritis of knee: Secondary | ICD-10-CM

## 2018-12-14 DIAGNOSIS — Z8659 Personal history of other mental and behavioral disorders: Secondary | ICD-10-CM

## 2018-12-14 DIAGNOSIS — M47816 Spondylosis without myelopathy or radiculopathy, lumbar region: Secondary | ICD-10-CM

## 2018-12-14 MED ORDER — DICLOFENAC SODIUM 1 % TD GEL: TRANSDERMAL | 4 refills | Status: DC

## 2018-12-14 NOTE — Patient Instructions (Signed)
Standing Labs We placed an order today for your standing lab work.    Please come back and get your standing labs in September and every 3 months  We have open lab daily Monday through Thursday from 8:30-12:30 PM and 1:30-4:30 PM and Friday from 8:30-12:30 PM and 1:30 -4:00 PM at the office of Dr. Shaili Deveshwar.   You may experience shorter wait times on Monday and Friday afternoons. The office is located at [address], [address], [place], [place] [address] No appointment is necessary.   Labs are drawn by [facility].  You may receive a bill from [facility] for your lab work.  If you wish to have your labs drawn at another location, please call the office 24 hours in advance to send orders.  If you have any questions regarding directions or hours of operation,  please call [phone].   Just as a reminder please drink plenty of water prior to coming for your lab work. Thanks!  

## 2019-03-03 ENCOUNTER — Other Ambulatory Visit: Payer: Self-pay | Admitting: *Deleted

## 2019-03-03 MED ORDER — SECUKINUMAB 150 MG/ML ~~LOC~~ SOAJ: 300.0000 mg | SUBCUTANEOUS | 0 refills | Status: DC

## 2019-03-03 NOTE — Telephone Encounter (Signed)
Refill request received via fax.   Last Visit: 12/14/18 Next Visit: 05/17/19 Labs: 12/02/18 EOS 0.8 EOS % 14.6 RDW 17.1 MCH 25.7 MCHC 30.8 TB Gold: 08/20/18  Left message to advise patient she is due to update labs.  Okay to refill refill 30 day supply per Dr. Estanislado Pandy

## 2019-04-26 ENCOUNTER — Other Ambulatory Visit: Payer: Self-pay | Admitting: *Deleted

## 2019-04-26 ENCOUNTER — Telehealth: Payer: Self-pay | Admitting: *Deleted

## 2019-04-26 DIAGNOSIS — Z79899 Other long term (current) drug therapy: Secondary | ICD-10-CM

## 2019-04-26 DIAGNOSIS — E559 Vitamin D deficiency, unspecified: Secondary | ICD-10-CM

## 2019-04-26 NOTE — Telephone Encounter (Signed)
When samples of Cosentyx come in, please call patient. She will have labs performed 04/27/2019.

## 2019-04-27 ENCOUNTER — Other Ambulatory Visit: Payer: Self-pay

## 2019-04-27 DIAGNOSIS — E559 Vitamin D deficiency, unspecified: Secondary | ICD-10-CM

## 2019-04-27 LAB — VITAMIN D 25 HYDROXY (VIT D DEFICIENCY, FRACTURES): Vit D, 25-Hydroxy: 23 ng/mL — ABNORMAL LOW (ref 30–100)

## 2019-04-27 LAB — COMPLETE METABOLIC PANEL WITH GFR
AG Ratio: 1.4 (calc) (ref 1.0–2.5)
ALT: 31 U/L — ABNORMAL HIGH (ref 6–29)
AST: 24 U/L (ref 10–35)
Albumin: 4.3 g/dL (ref 3.6–5.1)
Alkaline phosphatase (APISO): 83 U/L (ref 37–153)
BUN: 10 mg/dL (ref 7–25)
CO2: 26 mmol/L (ref 20–32)
Calcium: 9.8 mg/dL (ref 8.6–10.4)
Chloride: 104 mmol/L (ref 98–110)
Creat: 0.67 mg/dL (ref 0.50–0.99)
GFR, Est African American: 108 mL/min/{1.73_m2} (ref >=60)
GFR, Est Non African American: 93 mL/min/{1.73_m2} (ref >=60)
Globulin: 3.1 g/dL (calc) (ref 1.9–3.7)
Glucose, Bld: 72 mg/dL (ref 65–139)
Potassium: 4.2 mmol/L (ref 3.5–5.3)
Sodium: 139 mmol/L (ref 135–146)
Total Bilirubin: 0.4 mg/dL (ref 0.2–1.2)
Total Protein: 7.4 g/dL (ref 6.1–8.1)

## 2019-04-27 LAB — CBC WITH DIFFERENTIAL/PLATELET
Absolute Monocytes: 857 cells/uL (ref 200–950)
Basophils Absolute: 109 cells/uL (ref 0–200)
Basophils Relative: 1.6 %
Eosinophils Absolute: 211 cells/uL (ref 15–500)
Eosinophils Relative: 3.1 %
HCT: 40.9 % (ref 35.0–45.0)
Hemoglobin: 14.1 g/dL (ref 11.7–15.5)
Lymphs Abs: 2883 cells/uL (ref 850–3900)
MCH: 29.7 pg (ref 27.0–33.0)
MCHC: 34.5 g/dL (ref 32.0–36.0)
MCV: 86.3 fL (ref 80.0–100.0)
MPV: 11.2 fL (ref 7.5–12.5)
Monocytes Relative: 12.6 %
Neutro Abs: 2740 cells/uL (ref 1500–7800)
Neutrophils Relative %: 40.3 %
Platelets: 294 10*3/uL (ref 140–400)
RBC: 4.74 10*6/uL (ref 3.80–5.10)
RDW: 13 % (ref 11.0–15.0)
Total Lymphocyte: 42.4 %
WBC: 6.8 10*3/uL (ref 3.8–10.8)

## 2019-04-27 MED ORDER — VITAMIN D (ERGOCALCIFEROL) 1.25 MG (50000 UNIT) PO CAPS: 50000.0000 [IU] | ORAL_CAPSULE | ORAL | 0 refills | Status: DC

## 2019-04-27 MED ORDER — SECUKINUMAB 150 MG/ML ~~LOC~~ SOAJ: 300.0000 mg | SUBCUTANEOUS | 0 refills | Status: DC

## 2019-04-27 NOTE — Progress Notes (Signed)
LFTs are mildly elevated.  Please advise patient to avoid NSAIDS, Tylenol and alcohol.

## 2019-04-27 NOTE — Addendum Note (Signed)
Addended by: Earnestine Mealing on: 04/27/2019 04:17 PM   Modules accepted: Orders

## 2019-04-27 NOTE — Telephone Encounter (Signed)
Spoke with patient and patient will come by the office today or tomorrow to pick up a sample.

## 2019-04-27 NOTE — Telephone Encounter (Signed)
Medication Samples have been provided to the patient.  Drug name: cosentyx Strength: 150mg         Qty: 2 pens LOT: [id number]  Exp.Date: 12/2019  Dosing instructions: Inject 300mg  into the skin every 28 days.   The patient has been instructed regarding the correct time, dose, and frequency of taking this medication, including desired effects and most common side effects.   Justinian Miano C Zyana Amaro 4:11 PM 04/27/2019

## 2019-04-27 NOTE — Progress Notes (Signed)
CBC WNL. Please see Dr. Arlean Hopping above note.

## 2019-04-27 NOTE — Telephone Encounter (Signed)
Patient requested refill to be sent to [facility].   Last Visit: 12/14/2018 Next Visit: 05/27/2019 Labs: 04/26/2019 LFTs are mildly elevated.  TB Gold: 08/20/2018 negative   Okay to refill per Dr. Estanislado Pandy.

## 2019-04-27 NOTE — Progress Notes (Signed)
Vitamin D is low.  Please notify patient and send in vitamin D 50,000 units by mouth once weekly x3 months.  Recheck vitamin D in 3 months.

## 2019-05-17 ENCOUNTER — Telehealth: Payer: Self-pay | Admitting: Pharmacy Technician

## 2019-05-17 ENCOUNTER — Ambulatory Visit: Payer: Managed Care, Other (non HMO) | Admitting: Physician Assistant

## 2019-05-17 NOTE — Telephone Encounter (Signed)
Submitted a Prior Authorization request to [employer] for [place] via Cover My Meds. Will update once we receive a response.

## 2019-05-17 NOTE — Telephone Encounter (Signed)
Received notification from [place] regarding a prior authorization for [place]. Authorization has been APPROVED from 05/17/19 to 05/16/20.   Will send document to scan center.  Authorization # [id number]

## 2019-05-24 NOTE — Progress Notes (Signed)
Virtual Visit via Telephone Note  I connected with Cassidy Reeves on 05/27/19 at  1:15 PM EST by telephone and verified that I am speaking with the correct person using two identifiers.  Location: Patient: Home  Provider: Clinic  This service was conducted via virtual visit.  The patient was located at home. I was located in my office.  Consent was obtained prior to the virtual visit and is aware of possible charges through their insurance for this visit.  The patient is an established patient.  Dr. Estanislado Pandy, MD conducted the virtual visit and Hazel Sams, PA-C acted as scribe during the service.  Office staff helped with scheduling follow up visits after the service was conducted.     I discussed the limitations, risks, security and privacy concerns of performing an evaluation and management service by telephone and the availability of in person appointments. I also discussed with the patient that there may be a patient responsible charge related to this service. The patient expressed understanding and agreed to proceed.  CC: Left hand pain History of Present Illness: Cassidy Reeves is a 64 y.o. female with history of psoriatic arthritis, osteoarthritis, and DDD. She is on Cosentyx 300 mg sq once monthly.  She has not had any new or worsening symptoms recently. She has ongoing pain and inflammation in the left knee joint. She continues to have discomfort in her left hand.  She attributes the left hand pain to talking on the phone and typing throughout the day. She denies any psoriasis at this time.     Review of Systems  Constitutional: Negative for fever and malaise/fatigue.  Eyes: Negative for photophobia, pain, discharge and redness.  Respiratory: Negative for cough, shortness of breath and wheezing.   Cardiovascular: Negative for chest pain and palpitations.  Gastrointestinal: Negative for blood in stool, constipation and diarrhea.  Genitourinary: Negative for dysuria.  Musculoskeletal:  Positive for joint pain. Negative for back pain, myalgias and neck pain.  Skin: Negative for rash.  Neurological: Negative for dizziness and headaches.  Psychiatric/Behavioral: Negative for depression. The patient is not nervous/anxious and does not have insomnia.      Observations/Objective: Physical Exam  Constitutional: She is oriented to person, place, and time.  Neurological: She is alert and oriented to person, place, and time.  Psychiatric: Mood, memory, affect and judgment normal.   Patient reports morning stiffness for 1-1.5   hours.   Patient denies nocturnal pain.  Difficulty dressing/grooming: Denies Difficulty climbing stairs: Reports  Difficulty getting out of chair: Denies Difficulty using hands for taps, buttons, cutlery, and/or writing: Denies   Assessment and Plan: Visit Diagnoses: Psoriatic arthropathy ([place]) - She has not had any recent psoriatic arthritis flares.  She is clinically doing well on Cosentyx 300 mg subcutaneous injections every 28 days. She has ongoing morning stiffness for 1-1.5 hours daily.  She has not had any nocturnal pain currently.  She is experiencing discomfort in her left knee joint and left hand currently. She has no achilles tendonitis or plantar fasciitis. She has no SI joint pain currently.  She will continue on Cosentyx 300 mg sq injections every 28 days.  She was advised to notify us if she develops increased joint pain or joint swelling.  She will follow up in   Psoriasis - She has no active psoriasis at this time.   High risk medication use - She tried spacing Cosentyx 150 mg every 14 days in the past but noticed her psoriasis worsened so she resumed injecting  Cosentyx 300 mg sq once monthly. TB Gold: 08/20/2018 negative. CBC and CMP were drawn on 04/26/19.  She is due to update lab work in March and every 3 months. TB gold negative on 08/20/18.   Primary osteoarthritis of both hands: She experiences intermittent left hand discomfort.  She  has no inflammation at this time.  Joint protection and muscle strengthening were discussed.   Primary osteoarthritis of both knees: She has ongoing left knee joint pain and inflammation.  She plans on starting to walk more frequently for exercise.   DDD lumbar spine: She is not having any lower back pain currently.   Vitamin D deficiency - She is taking a vitamin D supplement. Future order for vitamin D is in place.  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 The patient was provided an opportunity to ask questions and all were answered. The patient agreed with the plan and demonstrated an understanding of the instructions.   The patient was advised to call back or seek an in-person evaluation if the symptoms worsen or if the condition fails to improve as anticipated.  I provided 15 minutes of non-face-to-face time during this encounter.  Bo Merino, MD   Scribed by-  Hazel Sams, PA-C

## 2019-05-27 ENCOUNTER — Telehealth (INDEPENDENT_AMBULATORY_CARE_PROVIDER_SITE_OTHER): Payer: Managed Care, Other (non HMO) | Admitting: Rheumatology

## 2019-05-27 ENCOUNTER — Encounter: Payer: Self-pay | Admitting: Rheumatology

## 2019-05-27 ENCOUNTER — Other Ambulatory Visit: Payer: Self-pay

## 2019-05-27 DIAGNOSIS — L409 Psoriasis, unspecified: Secondary | ICD-10-CM | POA: Diagnosis not present

## 2019-05-27 DIAGNOSIS — M47816 Spondylosis without myelopathy or radiculopathy, lumbar region: Secondary | ICD-10-CM

## 2019-05-27 DIAGNOSIS — L4050 Arthropathic psoriasis, unspecified: Secondary | ICD-10-CM

## 2019-05-27 DIAGNOSIS — Z8719 Personal history of other diseases of the digestive system: Secondary | ICD-10-CM

## 2019-05-27 DIAGNOSIS — M19042 Primary osteoarthritis, left hand: Secondary | ICD-10-CM

## 2019-05-27 DIAGNOSIS — Z8659 Personal history of other mental and behavioral disorders: Secondary | ICD-10-CM

## 2019-05-27 DIAGNOSIS — E559 Vitamin D deficiency, unspecified: Secondary | ICD-10-CM

## 2019-05-27 DIAGNOSIS — M170 Bilateral primary osteoarthritis of knee: Secondary | ICD-10-CM

## 2019-05-27 DIAGNOSIS — M19041 Primary osteoarthritis, right hand: Secondary | ICD-10-CM | POA: Diagnosis not present

## 2019-05-27 DIAGNOSIS — Z9884 Bariatric surgery status: Secondary | ICD-10-CM

## 2019-05-27 DIAGNOSIS — Z79899 Other long term (current) drug therapy: Secondary | ICD-10-CM | POA: Diagnosis not present

## 2019-06-10 ENCOUNTER — Telehealth: Payer: Self-pay | Admitting: Rheumatology

## 2019-06-10 NOTE — Telephone Encounter (Signed)
I LMOM for patient to call, and schedule a follow up appointment for around 09/24/19.

## 2019-06-10 NOTE — Telephone Encounter (Signed)
-----   Message from [place] sent at 05/27/2019  2:50 PM EST ----- Patient due for follow up in 3-4 months. Thanks!

## 2019-07-13 ENCOUNTER — Other Ambulatory Visit: Payer: Self-pay | Admitting: Rheumatology

## 2019-07-13 NOTE — Telephone Encounter (Signed)
Patient advised she will due to update Vitamin D level prior to refilling.

## 2019-07-18 IMAGING — DX DG SHOULDER 2+V*R*
3 series · 3 of 3 positions shown · non-contrast
Comparison: Chest radiographs 11/27/2015.

CLINICAL DATA: 61-year-old female status post fall at restaurant.
Right posterior shoulder pain radiating down the right arm.

EXAM:
RIGHT SHOULDER - 2+ VIEW

[shoulder grashey]
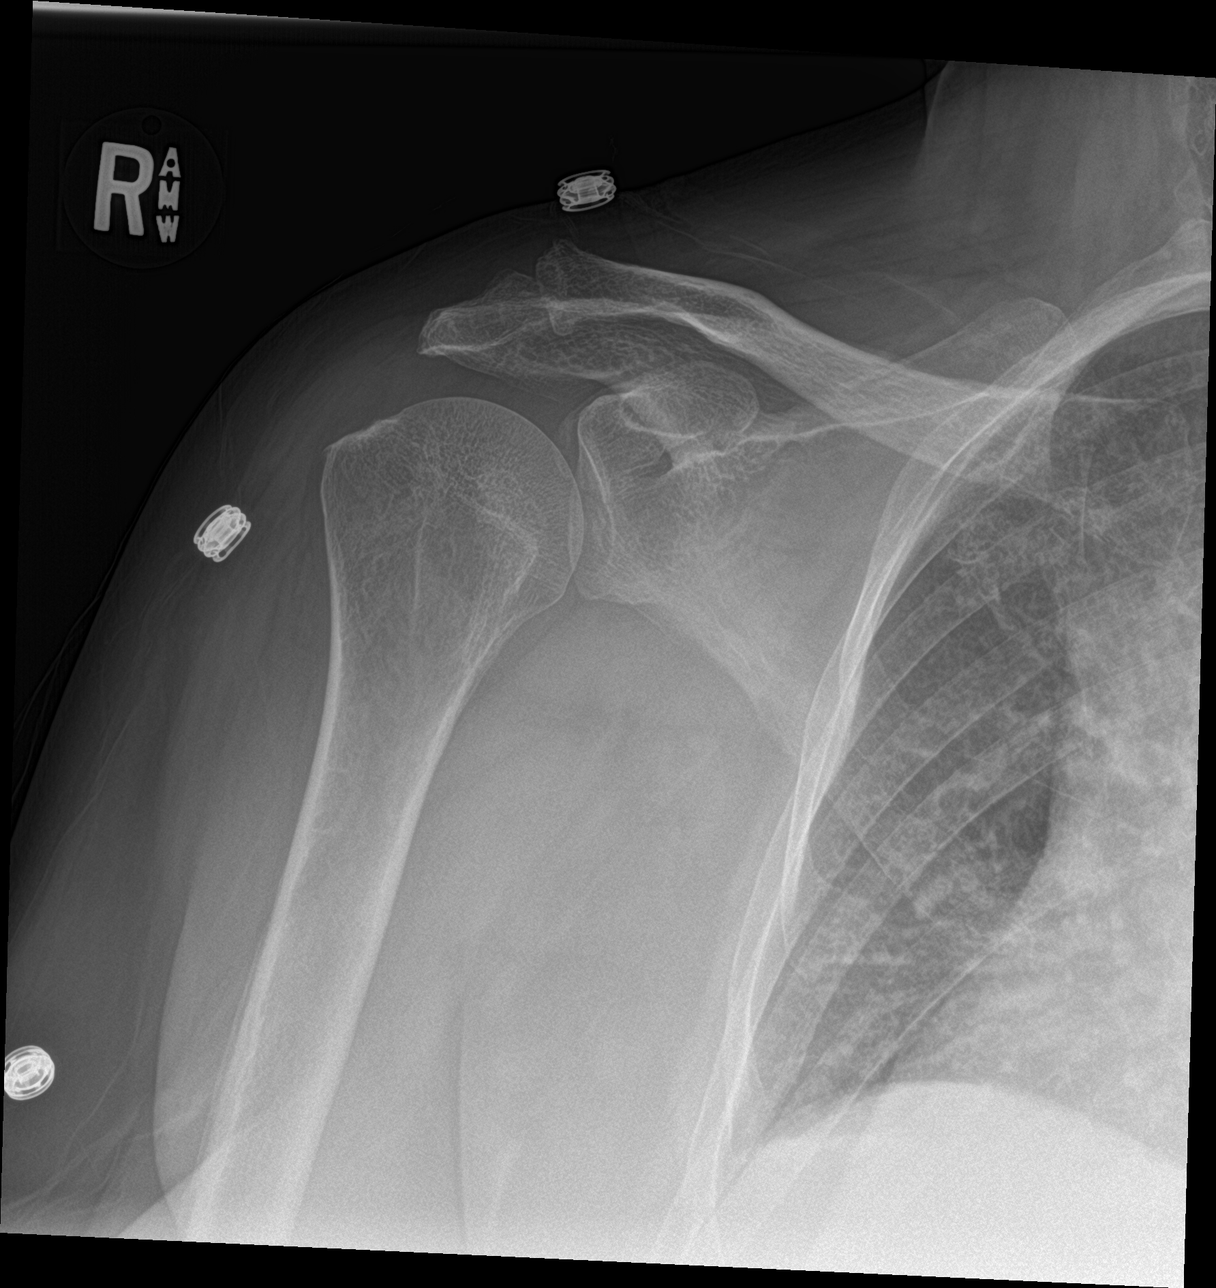

[shoulder y view]
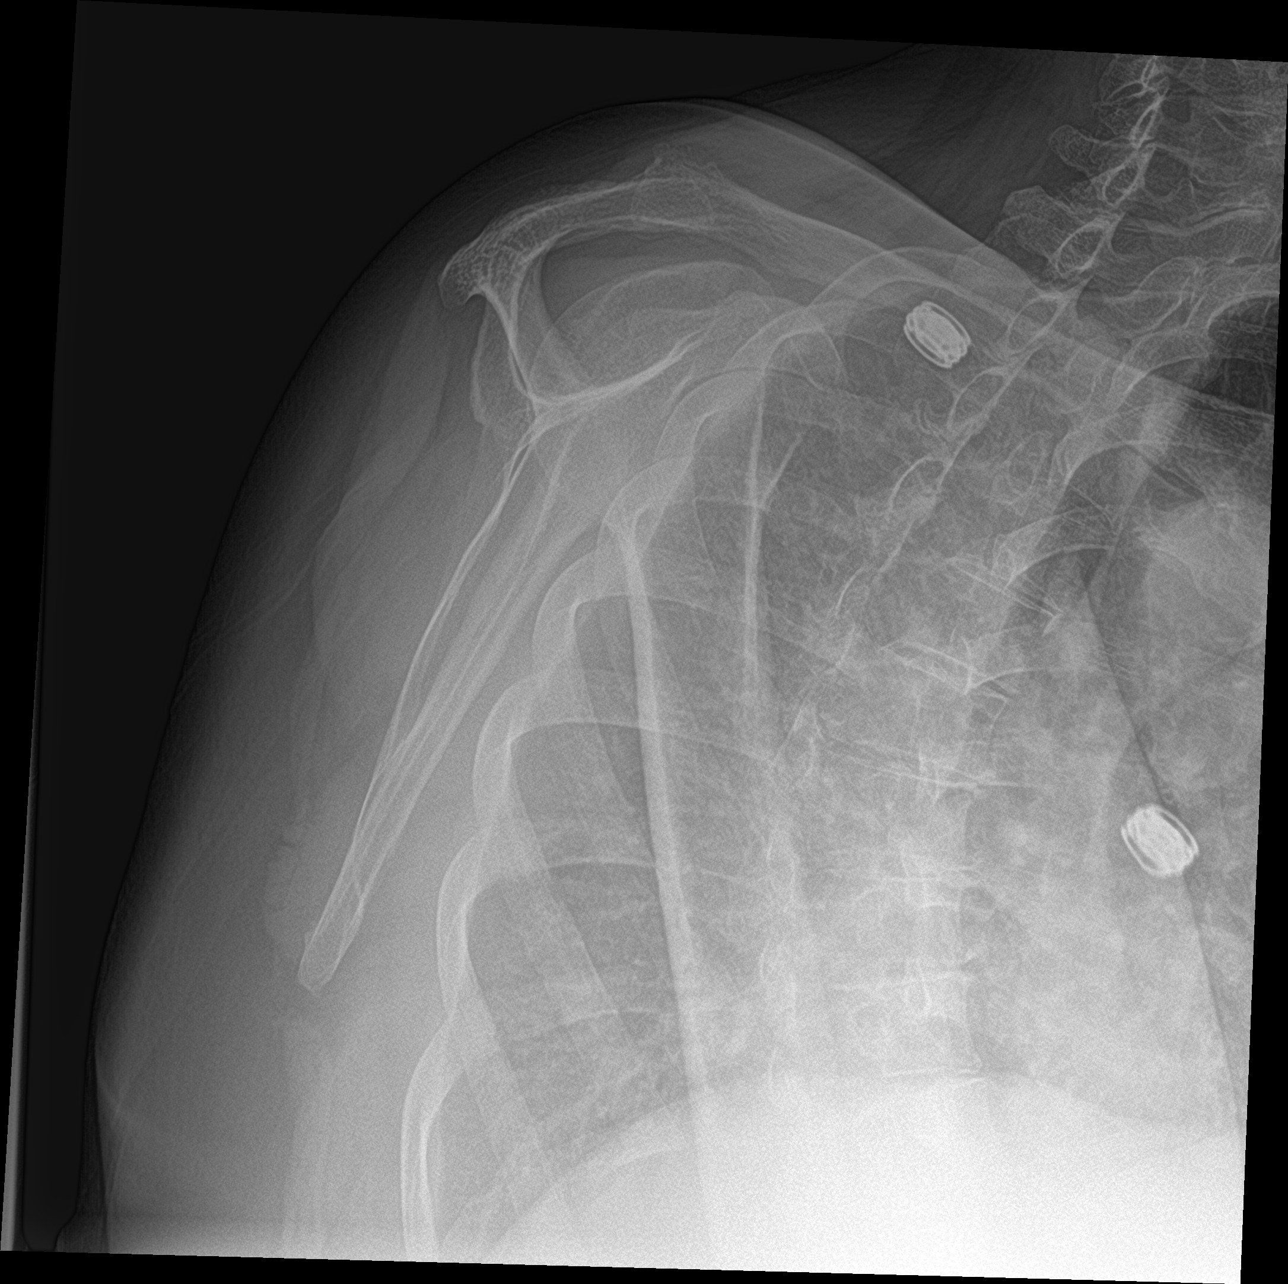

[shoulder axillary]
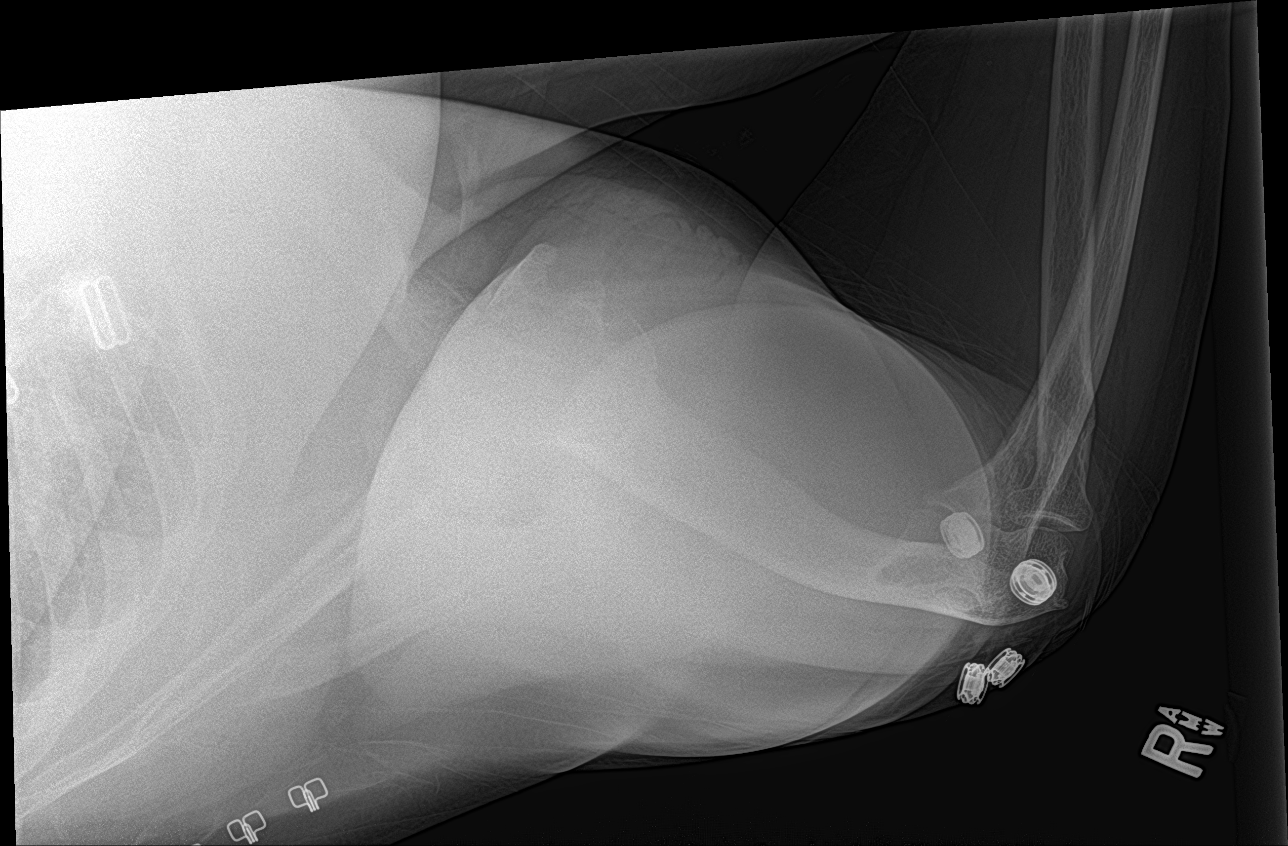

[3 of 3 positions shown; findings below may reference images not displayed]

FINDINGS: No glenohumeral joint dislocation. The proximal right humerus
appears intact. Relative osteopenia. The right clavicle in scapula
appear intact. Negative visible right ribs. Right lung atelectasis
suspected.
IMPRESSION: No acute fracture or dislocation identified about the right
shoulder.

## 2019-07-20 ENCOUNTER — Other Ambulatory Visit: Payer: Self-pay | Admitting: *Deleted

## 2019-07-20 MED ORDER — SECUKINUMAB 150 MG/ML ~~LOC~~ SOAJ: 300.0000 mg | SUBCUTANEOUS | 0 refills | Status: DC

## 2019-07-20 NOTE — Telephone Encounter (Signed)
Refill request received via fax  Last Visit: 05/27/19 Next Visit: 09/27/19  Labs: 04/25/20 CBC WNL LFT's mildly elevated  TB Gold: 08/20/18 Neg   Patient reminded she is due to update labs this month. Patient will update this week or next.   Okay to refill per Dr. Estanislado Pandy

## 2019-07-22 ENCOUNTER — Other Ambulatory Visit: Payer: Self-pay

## 2019-07-22 ENCOUNTER — Ambulatory Visit: Payer: Self-pay

## 2019-07-22 ENCOUNTER — Other Ambulatory Visit: Payer: Self-pay | Admitting: Occupational Medicine

## 2019-07-22 DIAGNOSIS — Z0000 Encounter for general adult medical examination without abnormal findings: Secondary | ICD-10-CM

## 2019-07-22 DIAGNOSIS — Z79899 Other long term (current) drug therapy: Secondary | ICD-10-CM

## 2019-07-22 DIAGNOSIS — E559 Vitamin D deficiency, unspecified: Secondary | ICD-10-CM

## 2019-07-23 LAB — COMPLETE METABOLIC PANEL WITH GFR
AG Ratio: 1.5 (calc) (ref 1.0–2.5)
ALT: 22 U/L (ref 6–29)
AST: 20 U/L (ref 10–35)
Albumin: 4.2 g/dL (ref 3.6–5.1)
Alkaline phosphatase (APISO): 80 U/L (ref 37–153)
BUN: 14 mg/dL (ref 7–25)
CO2: 27 mmol/L (ref 20–32)
Calcium: 9.2 mg/dL (ref 8.6–10.4)
Chloride: 105 mmol/L (ref 98–110)
Creat: 0.63 mg/dL (ref 0.50–0.99)
GFR, Est African American: 110 mL/min/{1.73_m2} (ref >=60)
GFR, Est Non African American: 95 mL/min/{1.73_m2} (ref >=60)
Globulin: 2.8 g/dL (calc) (ref 1.9–3.7)
Glucose, Bld: 75 mg/dL (ref 65–99)
Potassium: 4.1 mmol/L (ref 3.5–5.3)
Sodium: 139 mmol/L (ref 135–146)
Total Bilirubin: 0.3 mg/dL (ref 0.2–1.2)
Total Protein: 7 g/dL (ref 6.1–8.1)

## 2019-07-23 LAB — CBC WITH DIFFERENTIAL/PLATELET
Absolute Monocytes: 773 cells/uL (ref 200–950)
Basophils Absolute: 104 cells/uL (ref 0–200)
Basophils Relative: 1.5 %
Eosinophils Absolute: 324 cells/uL (ref 15–500)
Eosinophils Relative: 4.7 %
HCT: 40.9 % (ref 35.0–45.0)
Hemoglobin: 13.9 g/dL (ref 11.7–15.5)
Lymphs Abs: 2677 cells/uL (ref 850–3900)
MCH: 29.4 pg (ref 27.0–33.0)
MCHC: 34 g/dL (ref 32.0–36.0)
MCV: 86.5 fL (ref 80.0–100.0)
MPV: 10.8 fL (ref 7.5–12.5)
Monocytes Relative: 11.2 %
Neutro Abs: 3022 cells/uL (ref 1500–7800)
Neutrophils Relative %: 43.8 %
Platelets: 299 10*3/uL (ref 140–400)
RBC: 4.73 10*6/uL (ref 3.80–5.10)
RDW: 12.8 % (ref 11.0–15.0)
Total Lymphocyte: 38.8 %
WBC: 6.9 10*3/uL (ref 3.8–10.8)

## 2019-07-23 LAB — VITAMIN D 25 HYDROXY (VIT D DEFICIENCY, FRACTURES): Vit D, 25-Hydroxy: 30 ng/mL (ref 30–100)

## 2019-09-20 ENCOUNTER — Other Ambulatory Visit: Payer: Self-pay

## 2019-09-20 ENCOUNTER — Telehealth: Payer: Self-pay | Admitting: Rheumatology

## 2019-09-20 DIAGNOSIS — Z79899 Other long term (current) drug therapy: Secondary | ICD-10-CM

## 2019-09-20 MED ORDER — SECUKINUMAB 150 MG/ML ~~LOC~~ SOAJ: 300.0000 mg | SUBCUTANEOUS | 0 refills | Status: DC

## 2019-09-20 NOTE — Telephone Encounter (Signed)
Was able to run a successful claim at [facility] for patient's Cosentyx. Patient can fill with [facility] as long as there are no plan changes. Plan only allows 30 days supply at a time. Called and spoke to patient. Please send prescription to [facility]. Will schedule 1st shipment in 3 weeks.

## 2019-09-20 NOTE — Telephone Encounter (Signed)
Patient called stating she is still having difficulty with the specialty pharmacy refilling her prescription of Cosentyx.  Patient is requesting a return call to let her know if she can stop by the office today and pick up a sample.

## 2019-09-20 NOTE — Telephone Encounter (Signed)
Last Visit: 05/27/2019 telemedicine  Next Visit: 10/06/2019 Labs: 07/22/2019 CBC and CMP WNL.  TB Gold: 08/20/2018 negative   Patient will come by the office today to pick up a sample and update Tb gold.   Okay to refill per Dr. Estanislado Pandy.     Patient states [facility] will only dispense a 1 month supply at a time, patient has had a lot of difficulty with [facility] and would like to have cosentyx filled at [facility] outpatient pharmacy. Can you please check into this? Per patient, the issues with [facility] have been ongoing and she has spoken to numerous people at [employer].

## 2019-09-20 NOTE — Telephone Encounter (Signed)
Prescription sent to [facility] long outpatient pharmacy.

## 2019-09-20 NOTE — Addendum Note (Signed)
Addended by: Earnestine Mealing on: 09/20/2019 04:05 PM   Modules accepted: Orders

## 2019-09-20 NOTE — Telephone Encounter (Signed)
Medication Samples have been provided to the patient.  Drug name: cosentyx   Strength: 300mg  dose    Qty: 1   LOT: [id number]  Exp.Date: 09/2020  Dosing instructions: Inject 300mg  into the skin every 28 days.   The patient has been instructed regarding the correct time, dose, and frequency of taking this medication, including desired effects and most common side effects.   Cassidy Reeves 2:38 PM 09/20/2019

## 2019-09-22 ENCOUNTER — Encounter: Payer: Self-pay | Admitting: Rheumatology

## 2019-09-22 LAB — COMPLETE METABOLIC PANEL WITH GFR
AG Ratio: 1.4 (calc) (ref 1.0–2.5)
ALT: 23 U/L (ref 6–29)
AST: 21 U/L (ref 10–35)
Albumin: 4.2 g/dL (ref 3.6–5.1)
Alkaline phosphatase (APISO): 83 U/L (ref 37–153)
BUN: 15 mg/dL (ref 7–25)
CO2: 27 mmol/L (ref 20–32)
Calcium: 9.5 mg/dL (ref 8.6–10.4)
Chloride: 102 mmol/L (ref 98–110)
Creat: 0.74 mg/dL (ref 0.50–0.99)
GFR, Est African American: 99 mL/min/{1.73_m2} (ref >=60)
GFR, Est Non African American: 86 mL/min/{1.73_m2} (ref >=60)
Globulin: 2.9 g/dL (calc) (ref 1.9–3.7)
Glucose, Bld: 76 mg/dL (ref 65–99)
Potassium: 4.6 mmol/L (ref 3.5–5.3)
Sodium: 140 mmol/L (ref 135–146)
Total Bilirubin: 0.3 mg/dL (ref 0.2–1.2)
Total Protein: 7.1 g/dL (ref 6.1–8.1)

## 2019-09-22 LAB — CBC WITH DIFFERENTIAL/PLATELET
Absolute Monocytes: 767 cells/uL (ref 200–950)
Basophils Absolute: 110 cells/uL (ref 0–200)
Basophils Relative: 1.5 %
Eosinophils Absolute: 321 cells/uL (ref 15–500)
Eosinophils Relative: 4.4 %
HCT: 42.1 % (ref 35.0–45.0)
Hemoglobin: 13.8 g/dL (ref 11.7–15.5)
Lymphs Abs: 2869 cells/uL (ref 850–3900)
MCH: 28.8 pg (ref 27.0–33.0)
MCHC: 32.8 g/dL (ref 32.0–36.0)
MCV: 87.7 fL (ref 80.0–100.0)
MPV: 10.9 fL (ref 7.5–12.5)
Monocytes Relative: 10.5 %
Neutro Abs: 3234 cells/uL (ref 1500–7800)
Neutrophils Relative %: 44.3 %
Platelets: 323 10*3/uL (ref 140–400)
RBC: 4.8 10*6/uL (ref 3.80–5.10)
RDW: 12.1 % (ref 11.0–15.0)
Total Lymphocyte: 39.3 %
WBC: 7.3 10*3/uL (ref 3.8–10.8)

## 2019-09-22 LAB — QUANTIFERON-TB GOLD PLUS
Mitogen-NIL: >10 IU/mL
NIL: 0.04 IU/mL
QuantiFERON-TB Gold Plus: NEGATIVE
TB1-NIL: 0.01 IU/mL
TB2-NIL: 0 IU/mL

## 2019-09-23 NOTE — Progress Notes (Signed)
TB gold is negative.

## 2019-09-27 ENCOUNTER — Ambulatory Visit: Payer: Managed Care, Other (non HMO) | Admitting: Physician Assistant

## 2019-09-28 NOTE — Progress Notes (Signed)
Office Visit Note  Patient: Cassidy Reeves             Date of Birth: 03/10/1956           MRN: [id number]             PCP: Haywood Pao, MD Referring: Haywood Pao, MD Visit Date: 10/06/2019 Occupation: @GUAROCC @  Subjective:  Left knee joint pain   History of Present Illness: Cassidy Reeves is a 64 y.o. female with history of psoriatic arthritis and osteoarthritis.  Patient is on Cosentyx 300 mg subcutaneous injections once monthly.  She has not missed any doses of Cosentyx recently.  She has not had any recent psoriatic arthritis flares.  She continues to experience pain and intermittent swelling in her left knee joint.  She has difficulty climbing steps and getting up from a seated position due to the discomfort and stiffness.  She denies any SI joint pain at this time.  She does experience stiffness in her lower back intermittently.  She denies any Achilles tendinitis or plantar fasciitis.  She denies any active psoriasis at this time. According to the patient she had a recent bone density which was normal.  She continues to take a vitamin D supplement.     Activities of Daily Living:  Patient reports morning stiffness for 2 hours.   Patient Denies nocturnal pain.  Difficulty dressing/grooming: Denies Difficulty climbing stairs: Reports Difficulty getting out of chair: Denies Difficulty using hands for taps, buttons, cutlery, and/or writing: Denies  Review of Systems  Constitutional: Positive for fatigue.  HENT: Negative for mouth sores, mouth dryness and nose dryness.   Eyes: Negative for pain, visual disturbance and dryness.  Respiratory: Negative for cough, hemoptysis, shortness of breath and difficulty breathing.   Cardiovascular: Negative for chest pain, palpitations, hypertension and swelling in legs/feet.  Gastrointestinal: Negative for blood in stool, constipation and diarrhea.  Endocrine: Negative for increased urination.  Genitourinary: Negative for  difficulty urinating and painful urination.  Musculoskeletal: Positive for arthralgias, joint pain, joint swelling, morning stiffness and muscle tenderness. Negative for myalgias, muscle weakness and myalgias.  Skin: Negative for color change, pallor, rash, hair loss, nodules/bumps, skin tightness, ulcers and sensitivity to sunlight.  Allergic/Immunologic: Negative for susceptible to infections.  Neurological: Negative for dizziness, numbness, headaches and weakness.  Hematological: Negative for bruising/bleeding tendency and swollen glands.  Psychiatric/Behavioral: Positive for sleep disturbance. Negative for depressed mood. The patient is not nervous/anxious.     PMFS History:  Patient Active Problem List   Diagnosis Date Noted  . UTI (urinary tract infection) 11/09/2018  . History of depression 05/22/2017  . Primary osteoarthritis of both hands 07/22/2016  . DDD lumbar spine 07/22/2016  . Trigger finger 07/10/2016  . Psoriatic arthropathy ([place]) 07/10/2016  . High risk medication use 06/13/2016  . Primary osteoarthritis of both knees 06/13/2016  . History of gastric bypass 06/13/2016  . Vitamin D deficiency 06/13/2016  . Psoriasis 02/18/2013  . 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 Anxiety   . Arthritis   . GERD (gastroesophageal reflux disease)   . History of kidney stones   . Pelvic mass   . UTI (urinary tract infection) 11/09/2018    Family History  Problem Relation Age of Onset  . Heart disease Father   . Arthritis Father   . Psoriasis Father   . Parkinsonism Father   . Other Father 6  pacemaker  . Colon cancer Paternal Grandfather   . Heart failure Paternal Grandmother   . Stroke Maternal Grandmother   . Esophageal cancer Neg Hx   . Rectal cancer Neg Hx   . Stomach cancer Neg Hx    Past Surgical History:  Procedure Laterality Date  . ABDOMINAL HYSTERECTOMY    . APPENDECTOMY    . BACK SURGERY    .  CHOLECYSTECTOMY    . EXPLORATORY LAPAROTOMY    . GASTRIC BYPASS    . KNEE ARTHROSCOPY    . LITHOTRIPSY     Social History   Social History Narrative   Daily caffeine     There is no immunization history on file for this patient.   Objective: Vital Signs: BP 132/71 (BP Location: Left Arm, Patient Position: Sitting, Cuff Size: Normal)   Pulse 74   Resp 16   Ht 5\' 6"  (1.676 m)   Wt 251 lb 12.8 oz (114.2 kg)   BMI 40.64 kg/m    Physical Exam Vitals and nursing note reviewed.  Constitutional:      Appearance: She is well-developed.  HENT:     Head: Normocephalic and atraumatic.  Eyes:     Conjunctiva/sclera: Conjunctivae normal.  Cardiovascular:     Rate and Rhythm: Normal rate.  Pulmonary:     Effort: Pulmonary effort is normal.  Abdominal:     General: Bowel sounds are normal.     Palpations: Abdomen is soft.  Musculoskeletal:     Cervical back: Normal range of motion.  Lymphadenopathy:     Cervical: No cervical adenopathy.  Skin:    General: Skin is warm and dry.     Capillary Refill: Capillary refill takes less than 2 seconds.  Neurological:     Mental Status: She is alert and oriented to person, place, and time.  Psychiatric:        Behavior: Behavior normal.      Musculoskeletal Exam: C-spine limited range of motion with lateral rotation.  Good flexion and extension of the C-spine.  Thoracic and lumbar spine have good range of motion.  Shoulder joints, elbow joints, wrist joints, MCPs, PIPs, DIPs good range of motion with no synovitis.  She has tenderness of bilateral CMC joints.  PIP and DIP thickening consistent with osteoarthritis of both hands noted.  She has complete fist formation bilaterally.  Hip joints have good range of motion with no discomfort.  She has painful and slightly limited extension of the left knee joint.  Warmth of the left knee noted on exam.  Right knee has good range of motion with no warmth or effusion.  Ankle joints have good range of  motion with no tenderness or inflammation.  No achilles tendonitis or plantar fasciitis.   CDAI Exam: CDAI Score: -- Patient Global: --; Provider Global: -- Swollen: --; Tender: -- Joint Exam 10/06/2019   No joint exam has been documented for this visit   There is currently no information documented on the homunculus. Go to the Rheumatology activity and complete the homunculus joint exam.  Investigation: No additional findings.  Imaging: No results found.  Recent Labs: Lab Results  Component Value Date   WBC 7.3 09/20/2019   HGB 13.8 09/20/2019   PLT 323 09/20/2019   NA 140 09/20/2019   K 4.6 09/20/2019   CL 102 09/20/2019   CO2 27 09/20/2019   GLUCOSE 76 09/20/2019   BUN 15 09/20/2019   CREATININE 0.74 09/20/2019   BILITOT 0.3 09/20/2019  ALKPHOS 94 11/09/2018   AST 21 09/20/2019   ALT 23 09/20/2019   PROT 7.1 09/20/2019   ALBUMIN 3.0 (L) 11/09/2018   CALCIUM 9.5 09/20/2019   GFRAA 99 09/20/2019   QFTBGOLDPLUS NEGATIVE 09/20/2019    Speciality Comments: No specialty comments available.  Procedures:  Large Joint Inj: L knee on 10/06/2019 3:57 PM Indications: pain Details: 27 G 1.5 in needle, medial approach  Arthrogram: No  Medications: 1.5 mL lidocaine 1 %; 40 mg triamcinolone acetonide 40 MG/ML Aspirate: 0 mL Outcome: tolerated well, no immediate complications Procedure, treatment alternatives, risks and benefits explained, specific risks discussed. Consent was given by the patient. Immediately prior to procedure a time out was called to verify the correct patient, procedure, equipment, support staff and site/side marked as required. Patient was prepped and draped in the usual sterile fashion.     Allergies: Dilaudid [hydromorphone hcl] and Penicillins   Assessment / Plan:     Visit Diagnoses: Psoriatic arthropathy ([place]): She has no synovitis or dactylitis on exam.  She presents today with acute on chronic pain in the left knee joint.  She has warmth and  painful range of motion of the left knee on examination.  A cortisone injection was performed today.  She is not having any SI joint pain at this time.  No Achilles tendinitis or plantar fasciitis.  Her psoriasis has completely cleared well on Cosentyx.  Overall she is clinically been doing well on Cosentyx 300 mg subcutaneous injections every 28 days.  She has not missed any doses of Cosentyx recently.  She has not had any side effects or recent infections.  She will continue on Cosentyx as prescribed.  She does not need a refill at this time.  She was advised to notify us if she develops increased joint pain or joint swelling.  She will follow-up in the office in 5 months.  Psoriasis: She has no active psoriasis at this time.  She has a prescription for clobetasol to use as needed.  High risk medication use - Cosentyx 300 mg subcutaneous injections every 28 days. Tb gold negative on 09/20/2019.  CBC and CMP were within normal limits on 09/20/2019.  She will return for lab work in August and every 3 months to monitor for drug toxicity.  She has not had any recent infections.  She was advised to hold Cosentyx if she develops signs or symptoms of an infection and to resume once the infection is completely cleared.  She has received both COVID-19 vaccinations.  Primary osteoarthritis of both hands: She has PIP and DIP thickening consistent with osteoarthritis of both hands.  She has tenderness of bilateral CMC joints.  No inflammation was noted.  She has complete fist formation bilaterally.  Joint protection and muscle strengthening were discussed.  Primary osteoarthritis of both knees: Her right knee joint has good range of motion with no discomfort.  No warmth or effusion of the right knee joint noted on exam.  She presents today with acute on chronic pain in the left knee.  Warmth and inflammation of the left knee was noted on exam.  She has painful and limited extension of the left knee joint.  Updated x-rays  of the left knee were obtained which revealed progression when compared to x-rays from 06/14/2016.  She requested a left knee joint cortisone injection.  She tolerated the procedure well.  The procedure note was completed above.  Aftercare was discussed.  We also discussed the importance of weight loss and  exercise.  She was given a handout of information about the [facility] weight loss [facility].  Chronic pain of left knee -She presents today with acute on chronic pain in the left knee joint.  Warmth and inflammation was noted.  No no effusion on exam.  X-rays of the left knee joint were updated today and compared to x-rays from 06/14/2016.  She has severe osteoarthritis and chondromalacia patella.  She requested a left knee joint cortisone injection today.  She tolerated the procedure well.  The procedure note was completed above.  We discussed the need for a knee replacement in the future.  Plan: XR KNEE 3 VIEW LEFT  DDD lumbar spine: She experiences stiffness in her lower back first thing in the morning.  Her discomfort usually improves with activity.  She is not having any SI joint pain currently.  Vitamin D deficiency: Her vitamin D was 30 on 07/22/2019.  She continues take a vitamin D supplement.  Other medical conditions are listed as follows:  History of anxiety: She is taking Cymbalta 60 mg 1 capsule daily.  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  History of gastroesophageal reflux (GERD)     Orders: Orders Placed This Encounter  Procedures  . Large Joint Inj  . XR KNEE 3 VIEW LEFT   No orders of the defined types were placed in this encounter.   Face-to-face time spent with patient was 30 minutes. Greater than 50% of time was spent in counseling and coordination of care.  Follow-Up Instructions: Return in about 5 months (around 03/07/2020) for Psoriatic arthritis.   Ofilia Neas, PA-C  Note - This record has been  created using Dragon software.  Chart creation errors have been sought, but may not always  have been located. Such creation errors do not reflect on  the standard of medical care.

## 2019-09-29 ENCOUNTER — Telehealth: Payer: Self-pay | Admitting: *Deleted

## 2019-09-29 NOTE — Telephone Encounter (Signed)
Received labs from [facility] on 09/22/2019 Reviewed by Hazel Sams, PA-C  CBC/CMP, Lipid Panel. UA  Baso% 2.6, LDL/HDL Ratio 2.5 Vitamin D 25.7 UA: Nitrite postive, Bacteria Many  Patient on Cosentyx 300 mg monthly.   Per Hazel Sams, PA-C, Vitamin D is low. IS she taking a vitamin D supplement? UA is consistent with a UT. Please notify patient to follow up with PCP. Hold Cosentyx while on antibiotics.    Attempted to contact the patient and left message for patient to call the office.

## 2019-09-29 NOTE — Telephone Encounter (Signed)
Received DEXA results from [facility].  Date of Scan: 09/22/2019 Lowest T-score and site measured: -0.9 Left hip Significant changes in BMD and site measured (5% and above):n/a   Current Regimen: Takes Vitamin D supplement  Recommendation:Continue  Vitamin D supplement.   Reviewed by Hazel Sams, PA-C

## 2019-10-06 ENCOUNTER — Other Ambulatory Visit: Payer: Self-pay

## 2019-10-06 ENCOUNTER — Ambulatory Visit: Payer: Managed Care, Other (non HMO) | Admitting: Physician Assistant

## 2019-10-06 ENCOUNTER — Encounter: Payer: Self-pay | Admitting: Physician Assistant

## 2019-10-06 ENCOUNTER — Ambulatory Visit: Payer: Self-pay

## 2019-10-06 VITALS — BP 132/71 | HR 74 | Resp 16 | Ht 66.0 in | Wt 251.8 lb

## 2019-10-06 DIAGNOSIS — Z79899 Other long term (current) drug therapy: Secondary | ICD-10-CM | POA: Diagnosis not present

## 2019-10-06 DIAGNOSIS — Z8659 Personal history of other mental and behavioral disorders: Secondary | ICD-10-CM

## 2019-10-06 DIAGNOSIS — M25562 Pain in left knee: Secondary | ICD-10-CM | POA: Diagnosis not present

## 2019-10-06 DIAGNOSIS — L4050 Arthropathic psoriasis, unspecified: Secondary | ICD-10-CM

## 2019-10-06 DIAGNOSIS — E559 Vitamin D deficiency, unspecified: Secondary | ICD-10-CM

## 2019-10-06 DIAGNOSIS — Z9884 Bariatric surgery status: Secondary | ICD-10-CM

## 2019-10-06 DIAGNOSIS — M19042 Primary osteoarthritis, left hand: Secondary | ICD-10-CM

## 2019-10-06 DIAGNOSIS — L409 Psoriasis, unspecified: Secondary | ICD-10-CM

## 2019-10-06 DIAGNOSIS — G8929 Other chronic pain: Secondary | ICD-10-CM | POA: Diagnosis not present

## 2019-10-06 DIAGNOSIS — Z8719 Personal history of other diseases of the digestive system: Secondary | ICD-10-CM

## 2019-10-06 DIAGNOSIS — M19041 Primary osteoarthritis, right hand: Secondary | ICD-10-CM

## 2019-10-06 DIAGNOSIS — M47816 Spondylosis without myelopathy or radiculopathy, lumbar region: Secondary | ICD-10-CM

## 2019-10-06 DIAGNOSIS — M170 Bilateral primary osteoarthritis of knee: Secondary | ICD-10-CM

## 2019-10-06 NOTE — Patient Instructions (Signed)
Standing Labs We placed an order today for your standing lab work.    Please come back and get your standing labs in August and every 3 months   We have open lab daily Monday through Thursday from 8:30-12:30 PM and 1:30-4:30 PM and Friday from 8:30-12:30 PM and 1:30-4:00 PM at the office of Dr. Shaili Deveshwar.   You may experience shorter wait times on Monday and Friday afternoons. The office is located at [address], [address], [place], [place] [address] No appointment is necessary.   Labs are drawn by [facility].  You may receive a bill from [facility] for your lab work.  If you wish to have your labs drawn at another location, please call the office 24 hours in advance to send orders.  If you have any questions regarding directions or hours of operation,  please call [phone].   Just as a reminder please drink plenty of water prior to coming for your lab work. Thanks!   

## 2019-10-20 MED FILL — COSENTYX 300 MG DOSE-2 PENS: 150 | 28 days supply | Qty: 2 | Fill #0

## 2019-11-10 ENCOUNTER — Other Ambulatory Visit: Payer: Self-pay | Admitting: Rheumatology

## 2019-11-10 NOTE — Telephone Encounter (Signed)
Last visit: 10/06/2019 Next visit: 03/08/2020 Labs: 09/20/2019 CBC and CMP WNL TB gold: 09/20/2019 Neg   Current Dose per office note on 10/06/2019: Cosentyx 300 mg subcutaneous injections every 28 days DX: Psoriatic arthropathy   Okay to refill per Dr. Estanislado Pandy

## 2019-11-11 MED FILL — COSENTYX 300 MG DOSE-2 PENS: 150 | 28 days supply | Qty: 2 | Fill #0

## 2019-12-08 ENCOUNTER — Other Ambulatory Visit: Payer: Self-pay | Admitting: Rheumatology

## 2019-12-08 NOTE — Telephone Encounter (Signed)
Last visit: 10/06/2019 Next visit: 03/08/2020 Labs: 09/20/2019 CBC and CMP WNL TB gold: 09/20/2019 Neg   Current Dose per office note on 10/06/2019: Cosentyx 300 mg subcutaneous injections every 28 days DX: Psoriatic arthropathy   Okay to refill per Dr. Estanislado Pandy

## 2019-12-14 MED FILL — COSENTYX 300 MG DOSE-2 PENS: 150 | 28 days supply | Qty: 2 | Fill #0

## 2020-01-07 ENCOUNTER — Other Ambulatory Visit: Payer: Self-pay | Admitting: Rheumatology

## 2020-01-07 MED FILL — COSENTYX 300 MG DOSE-2 PENS: 150 | 28 days supply | Qty: 2 | Fill #0

## 2020-01-07 NOTE — Telephone Encounter (Addendum)
Last visit: 10/06/2019 Next visit: 03/08/2020 Labs: 09/20/2019 CBC and CMP WNL TB gold: 09/20/2019 Neg   Current Dose per office note on 10/06/2019: Cosentyx 300 mg subcutaneous injections every 28 days DX: Psoriatic arthropathy   Patient advised she is due to update labs. Patient states she will come to get them as soon as she gets back into town. Patient states she is being sent out of town for diaster relief.   Okay to refill 30 day supply Cosentyx?

## 2020-02-07 ENCOUNTER — Other Ambulatory Visit: Payer: Self-pay | Admitting: Rheumatology

## 2020-02-17 ENCOUNTER — Other Ambulatory Visit: Payer: Self-pay | Admitting: *Deleted

## 2020-02-17 ENCOUNTER — Telehealth: Payer: Self-pay

## 2020-02-17 DIAGNOSIS — Z79899 Other long term (current) drug therapy: Secondary | ICD-10-CM

## 2020-02-17 NOTE — Telephone Encounter (Signed)
Medication Samples have been provided to the patient.  Drug name: Cosentyx    Strength: 300 mg       Qty: 1 LOT: [id number]  Exp.Date: 08/2021  Dosing instructions: Inject 2 pens into skin every 28 days.  The patient has been instructed regarding the correct time, dose, and frequency of taking this medication, including desired effects and most common side effects.   Gwenlyn Perking 4:01 PM 02/17/2020

## 2020-02-17 NOTE — Telephone Encounter (Signed)
Sample reserve in Fridge for patient.

## 2020-02-17 NOTE — Telephone Encounter (Signed)
Patient called stating she is coming to the office today for labwork.  Patient would like to pick up a sample of Cosentyx since she was unable to refill her prescription.

## 2020-02-17 NOTE — Telephone Encounter (Signed)
Ok to provide sample of cosentyx. Refill will be sent to the pharmacy once lab work has resulted.

## 2020-02-18 LAB — COMPLETE METABOLIC PANEL WITH GFR
AG Ratio: 1.6 (calc) (ref 1.0–2.5)
ALT: 23 U/L (ref 6–29)
AST: 22 U/L (ref 10–35)
Albumin: 4.1 g/dL (ref 3.6–5.1)
Alkaline phosphatase (APISO): 86 U/L (ref 37–153)
BUN: 13 mg/dL (ref 7–25)
CO2: 27 mmol/L (ref 20–32)
Calcium: 9.1 mg/dL (ref 8.6–10.4)
Chloride: 104 mmol/L (ref 98–110)
Creat: 0.76 mg/dL (ref 0.50–0.99)
GFR, Est African American: 95 mL/min/{1.73_m2} (ref >=60)
GFR, Est Non African American: 82 mL/min/{1.73_m2} (ref >=60)
Globulin: 2.6 g/dL (calc) (ref 1.9–3.7)
Glucose, Bld: 90 mg/dL (ref 65–99)
Potassium: 3.8 mmol/L (ref 3.5–5.3)
Sodium: 141 mmol/L (ref 135–146)
Total Bilirubin: 0.3 mg/dL (ref 0.2–1.2)
Total Protein: 6.7 g/dL (ref 6.1–8.1)

## 2020-02-18 LAB — CBC WITH DIFFERENTIAL/PLATELET
Absolute Monocytes: 669 cells/uL (ref 200–950)
Basophils Absolute: 106 cells/uL (ref 0–200)
Basophils Relative: 1.4 %
Eosinophils Absolute: 319 cells/uL (ref 15–500)
Eosinophils Relative: 4.2 %
HCT: 39.1 % (ref 35.0–45.0)
Hemoglobin: 13.2 g/dL (ref 11.7–15.5)
Lymphs Abs: 2652 cells/uL (ref 850–3900)
MCH: 29.2 pg (ref 27.0–33.0)
MCHC: 33.8 g/dL (ref 32.0–36.0)
MCV: 86.5 fL (ref 80.0–100.0)
MPV: 11.1 fL (ref 7.5–12.5)
Monocytes Relative: 8.8 %
Neutro Abs: 3853 cells/uL (ref 1500–7800)
Neutrophils Relative %: 50.7 %
Platelets: 300 10*3/uL (ref 140–400)
RBC: 4.52 10*6/uL (ref 3.80–5.10)
RDW: 12.3 % (ref 11.0–15.0)
Total Lymphocyte: 34.9 %
WBC: 7.6 10*3/uL (ref 3.8–10.8)

## 2020-02-18 NOTE — Progress Notes (Signed)
CBC and CMP are normal.

## 2020-02-25 NOTE — Progress Notes (Deleted)
Office Visit Note  Patient: Cassidy Reeves             Date of Birth: [birth year]           MRN: [id number]             PCP: Haywood Pao, MD Referring: Haywood Pao, MD Visit Date: 03/08/2020 Occupation: @GUAROCC @  Subjective:  No chief complaint on file.   History of Present Illness: Cassidy Reeves is a 64 y.o. female ***   Activities of Daily Living:  Patient reports morning stiffness for *** {minute/hour:19697}.   Patient {ACTIONS;DENIES/REPORTS:21021675::"Denies"} nocturnal pain.  Difficulty dressing/grooming: {ACTIONS;DENIES/REPORTS:21021675::"Denies"} Difficulty climbing stairs: {ACTIONS;DENIES/REPORTS:21021675::"Denies"} Difficulty getting out of chair: {ACTIONS;DENIES/REPORTS:21021675::"Denies"} Difficulty using hands for taps, buttons, cutlery, and/or writing: {ACTIONS;DENIES/REPORTS:21021675::"Denies"}  No Rheumatology ROS completed.   PMFS History:  Patient Active Problem List   Diagnosis Date Noted  . UTI (urinary tract infection) 11/09/2018  . History of depression 05/22/2017  . Primary osteoarthritis of both hands 07/22/2016  . DDD lumbar spine 07/22/2016  . Trigger finger 07/10/2016  . Psoriatic arthropathy ([place]) 07/10/2016  . High risk medication use 06/13/2016  . Primary osteoarthritis of both knees 06/13/2016  . History of gastric bypass 06/13/2016  . Vitamin D deficiency 06/13/2016  . Psoriasis 02/18/2013  . 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 Anxiety   . Arthritis   . GERD (gastroesophageal reflux disease)   . History of kidney stones   . Pelvic mass   . UTI (urinary tract infection) 11/09/2018    Family History  Problem Relation Age of Onset  . Heart disease Father   . Arthritis Father   . Psoriasis Father   . Parkinsonism Father   . Other Father 67       pacemaker  . Colon cancer Paternal Grandfather   . Heart failure Paternal Grandmother   . Stroke Maternal Grandmother   .  Esophageal cancer Neg Hx   . Rectal cancer Neg Hx   . Stomach cancer Neg Hx    Past Surgical History:  Procedure Laterality Date  . ABDOMINAL HYSTERECTOMY    . APPENDECTOMY    . BACK SURGERY    . CHOLECYSTECTOMY    . EXPLORATORY LAPAROTOMY    . GASTRIC BYPASS    . KNEE ARTHROSCOPY    . LITHOTRIPSY     Social History   Social History Narrative   Daily caffeine     There is no immunization history on file for this patient.   Objective: Vital Signs: There were no vitals taken for this visit.   Physical Exam   Musculoskeletal Exam: ***  CDAI Exam: CDAI Score: -- Patient Global: --; Provider Global: -- Swollen: --; Tender: -- Joint Exam 03/08/2020   No joint exam has been documented for this visit   There is currently no information documented on the homunculus. Go to the Rheumatology activity and complete the homunculus joint exam.  Investigation: No additional findings.  Imaging: No results found.  Recent Labs: Lab Results  Component Value Date   WBC 7.6 02/17/2020   HGB 13.2 02/17/2020   PLT 300 02/17/2020   NA 141 02/17/2020   K 3.8 02/17/2020   CL 104 02/17/2020   CO2 27 02/17/2020   GLUCOSE 90 02/17/2020   BUN 13 02/17/2020   CREATININE 0.76 02/17/2020   BILITOT 0.3 02/17/2020   ALKPHOS 94 11/09/2018   AST 22 02/17/2020   ALT 23 02/17/2020  PROT 6.7 02/17/2020   ALBUMIN 3.0 (L) 11/09/2018   CALCIUM 9.1 02/17/2020   GFRAA 95 02/17/2020   QFTBGOLDPLUS NEGATIVE 09/20/2019    Speciality Comments: No specialty comments available.  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  No orders of the defined types were placed in this encounter.   Face-to-face time spent with patient was *** minutes. Greater than 50% of time was spent in counseling and coordination of care.  Follow-Up Instructions: No follow-ups on file.   Ofilia Neas, PA-C  Note - This record has been created using Dragon software.  Chart creation errors have been sought, but may not always  have been located. Such creation errors do not reflect on  the standard of medical care.

## 2020-02-28 ENCOUNTER — Other Ambulatory Visit: Payer: Self-pay | Admitting: Rheumatology

## 2020-02-28 NOTE — Telephone Encounter (Signed)
Last Visit: 10/06/2019 Next Visit: 03/08/2020  Labs: 02/17/2020 CBC and CMP are normal. TB Gold: 09/20/2019 Neg   Current Dose per office note 10/06/2019: Cosentyx 300 mg subcutaneous injections every 28 days  EC:[id number] arthropathy  Okay to refill per Dr. Estanislado Pandy

## 2020-03-06 MED FILL — COSENTYX 300 MG DOSE-2 PENS: 150 | 28 days supply | Qty: 2 | Fill #0

## 2020-03-07 ENCOUNTER — Telehealth: Payer: Self-pay

## 2020-03-07 NOTE — Telephone Encounter (Signed)
Called patient and discussed [employer] patient assistance for Medicare patients. Emailed patient application. She will reach out with any additional questions.

## 2020-03-07 NOTE — Telephone Encounter (Signed)
Patient called stating she is signing up for Medicare and has a couple of questions regarding her Cosentyx and how it is administered.  Patient's financial advisor states Medicare will pay for the injection if it is coded through the office, but not if the patient uses autoinjector at home.  Patient requested a return call.

## 2020-03-07 NOTE — Progress Notes (Signed)
Office Visit Note  Patient: Cassidy Reeves             Date of Birth: 10-15-1955           MRN: [id number]             PCP: Haywood Pao, MD Referring: Haywood Pao, MD Visit Date: 03/13/2020 Occupation: @GUAROCC @  Subjective:  Left knee joint pain   History of Present Illness: Cassidy Reeves is a 64 y.o. female with history of psoriatic arthritis, osteoarthritis, and DDD.  She is on cosentyx 300 mg sq injections once every 28 days.  She denies any recent psoriatic arthritis flares.  She denies any breakthrough pain prior to her monthly Cosentyx injections.  Her next Cosentyx dose is due on 03/16/2020.  She continues to have chronic pain in her left knee joint.  She uses Voltaren gel topically and uses ice and heat as needed for pain relief.  She was originally planning on having her left knee replaced in early 2022 but she plans on postponing it further.  She has been experiencing intermittent stiffness in both hands but denies any joint swelling.  She has been experiencing more muscle cramps in her lower extremities at night.  She denies any SI joint pain, Achilles tendinitis, or plantar fasciitis.  She denies any active psoriasis.     Activities of Daily Living:  Patient reports morning stiffness for 1-1.5 hours.   Patient Denies nocturnal pain.  Difficulty dressing/grooming: Denies Difficulty climbing stairs: Reports Difficulty getting out of chair: Denies Difficulty using hands for taps, buttons, cutlery, and/or writing: Denies  Review of Systems  Constitutional: Positive for fatigue.  HENT: Negative for mouth sores, mouth dryness and nose dryness.   Eyes: Negative for pain, visual disturbance and dryness.  Respiratory: Negative for cough, hemoptysis, shortness of breath and difficulty breathing.   Cardiovascular: Negative for chest pain, palpitations, hypertension and swelling in legs/feet.  Gastrointestinal: Negative for blood in stool, constipation and diarrhea.   Endocrine: Negative for increased urination.  Genitourinary: Negative for painful urination.  Musculoskeletal: Positive for arthralgias, joint pain, joint swelling, myalgias, morning stiffness and myalgias. Negative for muscle weakness and muscle tenderness.  Skin: Negative for color change, pallor, rash, hair loss, nodules/bumps, skin tightness, ulcers and sensitivity to sunlight.  Allergic/Immunologic: Negative for susceptible to infections.  Neurological: Negative for dizziness, numbness, headaches and weakness.  Hematological: Negative for swollen glands.  Psychiatric/Behavioral: Negative for depressed mood and sleep disturbance. The patient is not nervous/anxious.     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    Past Medical History:  Diagnosis Date   Anxiety    Arthritis    GERD (gastroesophageal reflux disease)    History of kidney stones    Pelvic mass    UTI (urinary tract infection) 11/09/2018    Family History  Problem Relation Age of Onset   Heart disease Father    Arthritis Father    Psoriasis Father    Parkinsonism Father  Other Father 24       pacemaker   Colon cancer Paternal Grandfather    Heart failure Paternal Grandmother    Stroke Maternal Grandmother    Esophageal cancer Neg Hx    Rectal cancer Neg Hx    Stomach cancer Neg Hx    Past Surgical History:  Procedure Laterality Date   ABDOMINAL HYSTERECTOMY     APPENDECTOMY     BACK SURGERY      CHOLECYSTECTOMY     EXPLORATORY LAPAROTOMY     GASTRIC BYPASS     KNEE ARTHROSCOPY     LITHOTRIPSY     Social History   Social History Narrative   Daily caffeine    Immunization History  Administered Date(s) Administered   PFIZER SARS-COV-2 Vaccination 07/27/2019, 08/17/2019, 02/02/2020     Objective: Vital Signs: BP 124/74 (BP Location: Left Arm, Patient Position: Sitting, Cuff Size: Small)    Pulse 69    Ht 5\' 6"  (1.676 m)    Wt 253 lb (114.8 kg)    BMI 40.84 kg/m    Physical Exam Vitals and nursing note reviewed.  Constitutional:      Appearance: She is well-developed.  HENT:     Head: Normocephalic and atraumatic.  Eyes:     Conjunctiva/sclera: Conjunctivae normal.  Pulmonary:     Effort: Pulmonary effort is normal.  Abdominal:     Palpations: Abdomen is soft.  Musculoskeletal:     Cervical back: Normal range of motion.  Skin:    General: Skin is warm and dry.     Capillary Refill: Capillary refill takes less than 2 seconds.  Neurological:     Mental Status: She is alert and oriented to person, place, and time.  Psychiatric:        Behavior: Behavior normal.      Musculoskeletal Exam:  C-spine, thoracic spine, and lumbar spine good ROM.  Shoulder joints, elbow joints, wrist joints, MCPs, PIPs, and DIPs good ROM with no synovitis.  Complete fist formation bilaterally.  Left CMC joint tenderness.  Hip joints good ROM with no discomfort.  Left knee has slightly limited extension.  Right knee has good ROM with no warmth or effusion.  Ankle joints good ROM with no discomfort.  No tenderness or inflammation of ankle joints.  No tenderness of MTP joints.  No tenderness over achilles tendons.   CDAI Exam: CDAI Score: -- Patient Global: --; Provider Global: -- Swollen: --; Tender: -- Joint Exam 03/13/2020   No joint exam has been documented for this visit   There is currently no information documented on the homunculus. Go to the Rheumatology activity and  complete the homunculus joint exam.  Investigation: No additional findings.  Imaging: No results found.  Recent Labs: Lab Results  Component Value Date   WBC 7.6 02/17/2020   HGB 13.2 02/17/2020   PLT 300 02/17/2020   NA 141 02/17/2020   K 3.8 02/17/2020   CL 104 02/17/2020   CO2 27 02/17/2020   GLUCOSE 90 02/17/2020   BUN 13 02/17/2020   CREATININE 0.76 02/17/2020   BILITOT 0.3 02/17/2020   ALKPHOS 94 11/09/2018   AST 22 02/17/2020   ALT 23 02/17/2020   PROT 6.7 02/17/2020   ALBUMIN 3.0 (L) 11/09/2018   CALCIUM 9.1 02/17/2020   GFRAA 95 02/17/2020   QFTBGOLDPLUS NEGATIVE 09/20/2019    Speciality Comments: No specialty comments available.  Procedures:  No procedures performed Allergies: Dilaudid [hydromorphone hcl] and Penicillins   Assessment / Plan:  Visit Diagnoses: Psoriatic arthropathy ([place]): He has no synovitis or dactylitis on exam.  She has not had any recent psoriatic arthritis flares.  She is clinically doing well on Cosentyx 300 mg subcutaneous injections once every 28 days.  She has not missed any doses of Cosentyx recently.  She has not had any recent infections.  She has no Achilles tendinitis or plantar fasciitis.  No SI joint tenderness palpation on examination today.  She continues to experience chronic pain in the left knee joint but is not ready to proceed with a knee replacement at this time.  She is been experiencing some intermittent stiffness in both hands but has not noticed any joint tenderness or swelling.  She will continue on Cosentyx 300 mg 7 days injections once every 28 days.  She does not need a refill at this time.  She was advised to notify us if she develops increased joint pain or joint swelling.  She will follow-up in the office in 5 months.  Psoriasis: She has no active psoriasis at this time.  High risk medication use - Cosentyx 300 mg subcutaneous injections every 28 days.   CBC and CMP WNL on 02/17/20.  She will be due to update  lab work in December and every 3 months to monitor for drug toxicity.  TB gold negative on 09/20/19 and will continue to be monitored yearly.  She has not had any recent infections.  We discussed the importance of holding Cosentyx if she develops signs or symptoms of an infection and to resume once the infection has completely cleared. She is planning on receiving the annual influenza vaccines. She has received all 3 covid-19 vaccine doses.  She was advised to notify us or her PCP if she contracts the covid-19 infection in order to receive the monoclonal antibody infusion.  She voiced understanding.   Primary osteoarthritis of both hands: She has PIP and DIP thickening consistent with osteoarthritis of both hands.  She has left CMC joint tenderness but no inflammation was noted.  She is able to make a complete fist bilaterally.  Joint protection and muscle strengthening were discussed.  She was given a handout of hand exercises to perform.  She was given a prescription for a left CMC joint brace.  Primary osteoarthritis of both knees: She has chronic pain in the left knee joint.  She has good range of motion in both knee joints on examination today with no discomfort.  No warmth or effusion was noted.  She plans on postponing the left knee replacement for now.  DDD lumbar spine: She has no discomfort at this time.  No symptoms of radiculopathy.  No midline spinal tenderness or SI joint tenderness.  Vitamin D deficiency: DEXA on 09/22/19: T-score -0.9 with BMD 0.826.   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  No orders of the defined types were placed in this encounter.    Follow-Up Instructions: Return in about 5 months (around 08/11/2020) for Psoriatic arthritis, Osteoarthritis.   Ofilia Neas, PA-C  Note - This record has been created using Dragon software.  Chart creation errors have been sought, but may not always  have been located. Such creation errors do not reflect on  the standard of medical care.

## 2020-03-08 ENCOUNTER — Ambulatory Visit: Payer: Managed Care, Other (non HMO) | Admitting: Physician Assistant

## 2020-03-08 DIAGNOSIS — L4050 Arthropathic psoriasis, unspecified: Secondary | ICD-10-CM

## 2020-03-08 DIAGNOSIS — E559 Vitamin D deficiency, unspecified: Secondary | ICD-10-CM

## 2020-03-08 DIAGNOSIS — M47816 Spondylosis without myelopathy or radiculopathy, lumbar region: Secondary | ICD-10-CM

## 2020-03-08 DIAGNOSIS — Z79899 Other long term (current) drug therapy: Secondary | ICD-10-CM

## 2020-03-08 DIAGNOSIS — Z8719 Personal history of other diseases of the digestive system: Secondary | ICD-10-CM

## 2020-03-08 DIAGNOSIS — Z8659 Personal history of other mental and behavioral disorders: Secondary | ICD-10-CM

## 2020-03-08 DIAGNOSIS — L409 Psoriasis, unspecified: Secondary | ICD-10-CM

## 2020-03-08 DIAGNOSIS — Z9884 Bariatric surgery status: Secondary | ICD-10-CM

## 2020-03-08 DIAGNOSIS — M170 Bilateral primary osteoarthritis of knee: Secondary | ICD-10-CM

## 2020-03-08 DIAGNOSIS — M19041 Primary osteoarthritis, right hand: Secondary | ICD-10-CM

## 2020-03-13 ENCOUNTER — Ambulatory Visit: Payer: Managed Care, Other (non HMO) | Admitting: Physician Assistant

## 2020-03-13 ENCOUNTER — Telehealth: Payer: Self-pay

## 2020-03-13 ENCOUNTER — Encounter: Payer: Self-pay | Admitting: Physician Assistant

## 2020-03-13 ENCOUNTER — Other Ambulatory Visit: Payer: Self-pay

## 2020-03-13 VITALS — BP 124/74 | HR 69 | Ht 66.0 in | Wt 253.0 lb

## 2020-03-13 DIAGNOSIS — Z8719 Personal history of other diseases of the digestive system: Secondary | ICD-10-CM

## 2020-03-13 DIAGNOSIS — E559 Vitamin D deficiency, unspecified: Secondary | ICD-10-CM

## 2020-03-13 DIAGNOSIS — Z79899 Other long term (current) drug therapy: Secondary | ICD-10-CM

## 2020-03-13 DIAGNOSIS — Z8659 Personal history of other mental and behavioral disorders: Secondary | ICD-10-CM

## 2020-03-13 DIAGNOSIS — Z9884 Bariatric surgery status: Secondary | ICD-10-CM

## 2020-03-13 DIAGNOSIS — M19041 Primary osteoarthritis, right hand: Secondary | ICD-10-CM

## 2020-03-13 DIAGNOSIS — L409 Psoriasis, unspecified: Secondary | ICD-10-CM

## 2020-03-13 DIAGNOSIS — M170 Bilateral primary osteoarthritis of knee: Secondary | ICD-10-CM

## 2020-03-13 DIAGNOSIS — L4050 Arthropathic psoriasis, unspecified: Secondary | ICD-10-CM | POA: Diagnosis not present

## 2020-03-13 DIAGNOSIS — M19042 Primary osteoarthritis, left hand: Secondary | ICD-10-CM

## 2020-03-13 DIAGNOSIS — M47816 Spondylosis without myelopathy or radiculopathy, lumbar region: Secondary | ICD-10-CM

## 2020-03-13 NOTE — Telephone Encounter (Deleted)
Please apply for Rasuvo or otrexup, per Hazel Sams, PA-C.   (patient has new insurance since the last time we applied).

## 2020-03-13 NOTE — Patient Instructions (Addendum)
Standing Labs We placed an order today for your standing lab work.   Please have your standing labs drawn in December and every 3 months   If possible, please have your labs drawn 2 weeks prior to your appointment so that the provider can discuss your results at your appointment.  We have open lab daily Monday through Thursday from 8:30-12:30 PM and 1:30-4:30 PM and Friday from 8:30-12:30 PM and 1:30-4:00 PM at the office of Dr. Bo Merino, [facility].   Please be advised, patients with office appointments requiring lab work will take precedents over walk-in lab work.  If possible, please come for your lab work on Monday and Friday afternoons, as you may experience shorter wait times. The office is located at [address], [place], [place], [place] [address] No appointment is necessary.   Labs are drawn by [facility]. Please bring your co-pay at the time of your lab draw.  You may receive a bill from [facility] for your lab work.  If you wish to have your labs drawn at another location, please call the office 24 hours in advance to send orders.  If you have any questions regarding directions or hours of operation,  please call [phone].   As a reminder, please drink plenty of water prior to coming for your lab work. Thanks!   Hand Exercises Hand exercises can be helpful for almost anyone. These exercises can strengthen the hands, improve flexibility and movement, and increase blood flow to the hands. These results can make work and daily tasks easier. Hand exercises can be especially helpful for people who have joint pain from arthritis or have nerve damage from overuse (carpal tunnel syndrome). These exercises can also help people who have injured a hand. Exercises Most of these hand exercises are gentle stretching and motion exercises. It is usually safe to do them often throughout the day. Warming up your hands before exercise may help to reduce stiffness. You can do  this with gentle massage or by placing your hands in warm water for 10-15 minutes. It is normal to feel some stretching, pulling, tightness, or mild discomfort as you begin new exercises. This will gradually improve. Stop an exercise right away if you feel sudden, severe pain or your pain gets worse. Ask your health care provider which exercises are best for you. Knuckle bend or "claw" fist 1. Stand or sit with your arm, hand, and all five fingers pointed straight up. Make sure to keep your wrist straight during the exercise. 2. Gently bend your fingers down toward your palm until the tips of your fingers are touching the top of your palm. Keep your big knuckle straight and just bend the small knuckles in your fingers. 3. Hold this position for __________ seconds. 4. Straighten (extend) your fingers back to the starting position. Repeat this exercise 5-10 times with each hand. Full finger fist 1. Stand or sit with your arm, hand, and all five fingers pointed straight up. Make sure to keep your wrist straight during the exercise. 2. Gently bend your fingers into your palm until the tips of your fingers are touching the middle of your palm. 3. Hold this position for __________ seconds. 4. Extend your fingers back to the starting position, stretching every joint fully. Repeat this exercise 5-10 times with each hand. Straight fist 1. Stand or sit with your arm, hand, and all five fingers pointed straight up. Make sure to keep your wrist straight during the exercise. 2. Gently bend  your fingers at the big knuckle, where your fingers meet your hand, and the middle knuckle. Keep the knuckle at the tips of your fingers straight and try to touch the bottom of your palm. 3. Hold this position for __________ seconds. 4. Extend your fingers back to the starting position, stretching every joint fully. Repeat this exercise 5-10 times with each hand. Tabletop 1. Stand or sit with your arm, hand, and all five  fingers pointed straight up. Make sure to keep your wrist straight during the exercise. 2. Gently bend your fingers at the big knuckle, where your fingers meet your hand, as far down as you can while keeping the small knuckles in your fingers straight. Think of forming a tabletop with your fingers. 3. Hold this position for __________ seconds. 4. Extend your fingers back to the starting position, stretching every joint fully. Repeat this exercise 5-10 times with each hand. Finger spread 1. Place your hand flat on a table with your palm facing down. Make sure your wrist stays straight as you do this exercise. 2. Spread your fingers and thumb apart from each other as far as you can until you feel a gentle stretch. Hold this position for __________ seconds. 3. Bring your fingers and thumb tight together again. Hold this position for __________ seconds. Repeat this exercise 5-10 times with each hand. Making circles 1. Stand or sit with your arm, hand, and all five fingers pointed straight up. Make sure to keep your wrist straight during the exercise. 2. Make a circle by touching the tip of your thumb to the tip of your index finger. 3. Hold for __________ seconds. Then open your hand wide. 4. Repeat this motion with your thumb and each finger on your hand. Repeat this exercise 5-10 times with each hand. Thumb motion 1. Sit with your forearm resting on a table and your wrist straight. Your thumb should be facing up toward the ceiling. Keep your fingers relaxed as you move your thumb. 2. Lift your thumb up as high as you can toward the ceiling. Hold for __________ seconds. 3. Bend your thumb across your palm as far as you can, reaching the tip of your thumb for the small finger (pinkie) side of your palm. Hold for __________ seconds. Repeat this exercise 5-10 times with each hand. Grip strengthening  1. Hold a stress ball or other soft ball in the middle of your hand. 2. Slowly increase the  pressure, squeezing the ball as much as you can without causing pain. Think of bringing the tips of your fingers into the middle of your palm. All of your finger joints should bend when doing this exercise. 3. Hold your squeeze for __________ seconds, then relax. Repeat this exercise 5-10 times with each hand. Contact a health care provider if:  Your hand pain or discomfort gets much worse when you do an exercise.  Your hand pain or discomfort does not improve within 2 hours after you exercise. If you have any of these problems, stop doing these exercises right away. Do not do them again unless your health care provider says that you can. Get help right away if:  You develop sudden, severe hand pain or swelling. If this happens, stop doing these exercises right away. Do not do them again unless your health care provider says that you can. This information is not intended to replace advice given to you by your health care provider. Make sure you discuss any questions you have with your health  care provider. Document Revised: 08/27/2018 Document Reviewed: 05/07/2018 Elsevier Patient Education  [place].

## 2020-03-14 NOTE — Telephone Encounter (Signed)
Submitted Patient Assistance Application to [employer] for [employer] along with provider portion. Will update patient when we receive a response.  Patient has not yet received Medicare cards to submit. Advised on cover letter to program  Fax# [phone] Phone# [phone]

## 2020-03-14 NOTE — Telephone Encounter (Signed)
Not yet finding active Medicare coverage for patient. Called and left message to see if she knows when plan should go live.   Will mail patient [employer] patient assistance application for Rasuvo.

## 2020-03-14 NOTE — Telephone Encounter (Signed)
Error. Not needed. Closing encounter

## 2020-03-22 NOTE — Telephone Encounter (Signed)
Patient's Medicare is now active.  Received notification from [facility] regarding a prior authorization for [place]. Authorization has been APPROVED from 03/22/20 to 05/19/21.   Authorization # ([employer]ari[employer]an Heath) - [id number]

## 2020-03-28 ENCOUNTER — Telehealth: Payer: Self-pay

## 2020-03-28 NOTE — Telephone Encounter (Signed)
Derrek Gu, Gaffer with [employer] Patient Assistance Program called requesting prior authorization approval letter for Cosentyx be faxed to [phone]   If you have any questions, please call [phone]

## 2020-03-29 NOTE — Telephone Encounter (Signed)
Refaxed Cosentyx approval letter. All updates will be in previous encounter.

## 2020-04-04 NOTE — Telephone Encounter (Signed)
Received fax from [employer], patient's renewal application has been APPROVED. Coverage dates are from 04/03/20 to 05/19/20.  Program will re-evaluate patient's eligibility for 2022.     Phone# [phone] Fax# [phone]

## 2020-06-20 ENCOUNTER — Telehealth: Payer: Self-pay | Admitting: *Deleted

## 2020-06-20 NOTE — Telephone Encounter (Signed)
Received a refill request from patient assistance. Patient is overdue for labs. Left message to advise patient she will need labs prior to refilling medication.

## 2020-06-26 ENCOUNTER — Other Ambulatory Visit: Payer: Self-pay

## 2020-06-26 DIAGNOSIS — Z79899 Other long term (current) drug therapy: Secondary | ICD-10-CM

## 2020-06-27 LAB — CBC WITH DIFFERENTIAL/PLATELET
Absolute Monocytes: 748 cells/uL (ref 200–950)
Basophils Absolute: 111 cells/uL (ref 0–200)
Basophils Relative: 1.3 %
Eosinophils Absolute: 315 cells/uL (ref 15–500)
Eosinophils Relative: 3.7 %
HCT: 43.2 % (ref 35.0–45.0)
Hemoglobin: 14.5 g/dL (ref 11.7–15.5)
Lymphs Abs: 2907 cells/uL (ref 850–3900)
MCH: 28.5 pg (ref 27.0–33.0)
MCHC: 33.6 g/dL (ref 32.0–36.0)
MCV: 85 fL (ref 80.0–100.0)
MPV: 11.2 fL (ref 7.5–12.5)
Monocytes Relative: 8.8 %
Neutro Abs: 4420 cells/uL (ref 1500–7800)
Neutrophils Relative %: 52 %
Platelets: 293 10*3/uL (ref 140–400)
RBC: 5.08 10*6/uL (ref 3.80–5.10)
RDW: 13 % (ref 11.0–15.0)
Total Lymphocyte: 34.2 %
WBC: 8.5 10*3/uL (ref 3.8–10.8)

## 2020-06-27 LAB — COMPLETE METABOLIC PANEL WITH GFR
AG Ratio: 1.6 (calc) (ref 1.0–2.5)
ALT: 28 U/L (ref 6–29)
AST: 26 U/L (ref 10–35)
Albumin: 4.5 g/dL (ref 3.6–5.1)
Alkaline phosphatase (APISO): 82 U/L (ref 37–153)
BUN: 12 mg/dL (ref 7–25)
CO2: 30 mmol/L (ref 20–32)
Calcium: 9.8 mg/dL (ref 8.6–10.4)
Chloride: 103 mmol/L (ref 98–110)
Creat: 0.76 mg/dL (ref 0.50–0.99)
GFR, Est African American: 95 mL/min/{1.73_m2} (ref >=60)
GFR, Est Non African American: 82 mL/min/{1.73_m2} (ref >=60)
Globulin: 2.8 g/dL (calc) (ref 1.9–3.7)
Glucose, Bld: 84 mg/dL (ref 65–99)
Potassium: 4.9 mmol/L (ref 3.5–5.3)
Sodium: 141 mmol/L (ref 135–146)
Total Bilirubin: 0.4 mg/dL (ref 0.2–1.2)
Total Protein: 7.3 g/dL (ref 6.1–8.1)

## 2020-07-14 ENCOUNTER — Telehealth: Payer: Self-pay

## 2020-07-14 MED ORDER — COSENTYX SENSOREADY (300 MG) 150 MG/ML ~~LOC~~ SOAJ: SUBCUTANEOUS | 0 refills | Status: DC

## 2020-07-14 NOTE — Telephone Encounter (Signed)
Patient called requesting prescription refill of Cosentyx to be sent to [employer] patient assistance.

## 2020-07-14 NOTE — Telephone Encounter (Signed)
Last Visit: 03/13/2020 Next Visit: 08/10/2020 Labs: 06/26/2020 CBC and CMP WNL TB Gold: 09/20/2019 Neg   Current Dose per office note 03/13/2020: Cosentyx 300 mg 7 days injections once every 28 days  DX: Psoriatic arthropathy   Last Fill: 02/28/2020  Okay to refill per Dr. Estanislado Pandy

## 2020-07-17 ENCOUNTER — Telehealth: Payer: Self-pay

## 2020-07-17 NOTE — Telephone Encounter (Signed)
Please advise 

## 2020-07-17 NOTE — Telephone Encounter (Signed)
Patient left a voicemail stating [employer] patient assistance still doesn't have new prescription for Cosentyx.  Patient states she called last week and was told it had been sent in.  Patient requested a return call.

## 2020-07-18 NOTE — Telephone Encounter (Signed)
Called [employer] regarding Cosentyx PAP. Patient is approved through PAP until 05/19/21. Rep advised that they have 2 refills in their system and no notes that the patient called.   Called patient and provided with phone number to schedule shipment [phone]. Patient advised that pharmacy has 2 refills and she should be able to schedule shipment. Advised she call clinic back if she runs into the same issue again.  Knox Saliva, PharmD, MPH Clinical Pharmacist (Rheumatology and Pulmonology)

## 2020-07-27 NOTE — Progress Notes (Deleted)
Office Visit Note  Patient: Cassidy Reeves             Date of Birth: December 12, 1955           MRN: [id number]             PCP: Haywood Pao, MD Referring: Haywood Pao, MD Visit Date: 08/10/2020 Occupation: @GUAROCC @  Subjective:  No chief complaint on file.   History of Present Illness: Cassidy Reeves is a 65 y.o. female ***   Activities of Daily Living:  Patient reports morning stiffness for *** {minute/hour:19697}.   Patient {ACTIONS;DENIES/REPORTS:21021675::"Denies"} nocturnal pain.  Difficulty dressing/grooming: {ACTIONS;DENIES/REPORTS:21021675::"Denies"} Difficulty climbing stairs: {ACTIONS;DENIES/REPORTS:21021675::"Denies"} Difficulty getting out of chair: {ACTIONS;DENIES/REPORTS:21021675::"Denies"} Difficulty using hands for taps, buttons, cutlery, and/or writing: {ACTIONS;DENIES/REPORTS:21021675::"Denies"}  No Rheumatology ROS completed.   PMFS History:  Patient Active Problem List   Diagnosis Date Noted  . UTI (urinary tract infection) 11/09/2018  . History of depression 05/22/2017  . Primary osteoarthritis of both hands 07/22/2016  . DDD lumbar spine 07/22/2016  . Trigger finger 07/10/2016  . 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 High risk medication use 06/13/2016  . Primary osteoarthritis of both knees 06/13/2016  . History of gastric bypass 06/13/2016  . Vitamin D deficiency 06/13/2016  . Psoriasis 02/18/2013  . 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 Anxiety   . Arthritis   . GERD (gastroesophageal reflux disease)   . History of kidney stones   . Pelvic mass   . UTI (urinary tract infection) 11/09/2018    Family History  Problem Relation Age of Onset  . Heart disease Father   . Arthritis Father   . Psoriasis Father   . Parkinsonism Father   . Other Father 2       pacemaker  . Colon cancer Paternal Grandfather   . Heart failure Paternal Grandmother   . Stroke Maternal Grandmother   .  Esophageal cancer Neg Hx   . Rectal cancer Neg Hx   . Stomach cancer Neg Hx    Past Surgical History:  Procedure Laterality Date  . ABDOMINAL HYSTERECTOMY    . APPENDECTOMY    . BACK SURGERY    . CHOLECYSTECTOMY    . EXPLORATORY LAPAROTOMY    . GASTRIC BYPASS    . KNEE ARTHROSCOPY    . LITHOTRIPSY     Social History   Social History Narrative   Daily caffeine    Immunization History  Administered Date(s) Administered  . PFIZER(Purple Top)SARS-COV-2 Vaccination 07/27/2019, 08/17/2019, 02/02/2020     Objective: Vital Signs: There were no vitals taken for this visit.   Physical Exam   Musculoskeletal Exam: ***  CDAI Exam: CDAI Score: -- Patient Global: --; Provider Global: -- Swollen: --; Tender: -- Joint Exam 08/10/2020   No joint exam has been documented for this visit   There is currently no information documented on the homunculus. Go to the Rheumatology activity and complete the homunculus joint exam.  Investigation: No additional findings.  Imaging: No results found.  Recent Labs: Lab Results  Component Value Date   WBC 8.5 06/26/2020   HGB 14.5 06/26/2020   PLT 293 06/26/2020   NA 141 06/26/2020   K 4.9 06/26/2020   CL 103 06/26/2020   CO2 30 06/26/2020   GLUCOSE 84 06/26/2020   BUN 12 06/26/2020   CREATININE 0.76 06/26/2020   BILITOT 0.4 06/26/2020   ALKPHOS 94 11/09/2018   AST 26 06/26/2020  ALT 28 06/26/2020   PROT 7.3 06/26/2020   ALBUMIN 3.0 (L) 11/09/2018   CALCIUM 9.8 06/26/2020   GFRAA 95 06/26/2020   QFTBGOLDPLUS NEGATIVE 09/20/2019    Speciality Comments: No specialty comments available.  Procedures:  No procedures performed Allergies: Dilaudid [hydromorphone hcl] and Penicillins   Assessment / Plan:     Visit Diagnoses: No diagnosis found.  Orders: No orders of the defined types were placed in this encounter.  No orders of the defined types were placed in this encounter.   Face-to-face time spent with patient was  *** minutes. Greater than 50% of time was spent in counseling and coordination of care.  Follow-Up Instructions: No follow-ups on file.   Earnestine Mealing, CMA  Note - This record has been created using Editor, commissioning.  Chart creation errors have been sought, but may not always  have been located. Such creation errors do not reflect on  the standard of medical care.

## 2020-08-10 ENCOUNTER — Ambulatory Visit: Payer: Medicare Other | Admitting: Rheumatology

## 2020-08-10 DIAGNOSIS — Z9884 Bariatric surgery status: Secondary | ICD-10-CM

## 2020-08-10 DIAGNOSIS — Z8659 Personal history of other mental and behavioral disorders: Secondary | ICD-10-CM

## 2020-08-10 DIAGNOSIS — Z8719 Personal history of other diseases of the digestive system: Secondary | ICD-10-CM

## 2020-08-10 DIAGNOSIS — M47816 Spondylosis without myelopathy or radiculopathy, lumbar region: Secondary | ICD-10-CM

## 2020-08-10 DIAGNOSIS — L4050 Arthropathic psoriasis, unspecified: Secondary | ICD-10-CM

## 2020-08-10 DIAGNOSIS — Z79899 Other long term (current) drug therapy: Secondary | ICD-10-CM

## 2020-08-10 DIAGNOSIS — E559 Vitamin D deficiency, unspecified: Secondary | ICD-10-CM

## 2020-08-10 DIAGNOSIS — M19041 Primary osteoarthritis, right hand: Secondary | ICD-10-CM

## 2020-08-10 DIAGNOSIS — M19042 Primary osteoarthritis, left hand: Secondary | ICD-10-CM

## 2020-08-10 DIAGNOSIS — L409 Psoriasis, unspecified: Secondary | ICD-10-CM

## 2020-08-10 DIAGNOSIS — M170 Bilateral primary osteoarthritis of knee: Secondary | ICD-10-CM

## 2020-08-11 NOTE — Progress Notes (Deleted)
Office Visit Note  Patient: Cassidy Reeves             Date of Birth: Oct 08, 1955           MRN: [id number]             PCP: Haywood Pao, MD Referring: Haywood Pao, MD Visit Date: 08/14/2020 Occupation: @GUAROCC @  Subjective:  No chief complaint on file.   History of Present Illness: Cassidy Reeves is a 65 y.o. female ***   Activities of Daily Living:  Patient reports morning stiffness for *** {minute/hour:19697}.   Patient {ACTIONS;DENIES/REPORTS:21021675::"Denies"} nocturnal pain.  Difficulty dressing/grooming: {ACTIONS;DENIES/REPORTS:21021675::"Denies"} Difficulty climbing stairs: {ACTIONS;DENIES/REPORTS:21021675::"Denies"} Difficulty getting out of chair: {ACTIONS;DENIES/REPORTS:21021675::"Denies"} Difficulty using hands for taps, buttons, cutlery, and/or writing: {ACTIONS;DENIES/REPORTS:21021675::"Denies"}  No Rheumatology ROS completed.   PMFS History:  Patient Active Problem List   Diagnosis Date Noted  . UTI (urinary tract infection) 11/09/2018  . History of depression 05/22/2017  . Primary osteoarthritis of both hands 07/22/2016  . DDD lumbar spine 07/22/2016  . Trigger finger 07/10/2016  . 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 High risk medication use 06/13/2016  . Primary osteoarthritis of both knees 06/13/2016  . History of gastric bypass 06/13/2016  . Vitamin D deficiency 06/13/2016  . Psoriasis 02/18/2013  . 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 Anxiety   . Arthritis   . GERD (gastroesophageal reflux disease)   . History of kidney stones   . Pelvic mass   . UTI (urinary tract infection) 11/09/2018    Family History  Problem Relation Age of Onset  . Heart disease Father   . Arthritis Father   . Psoriasis Father   . Parkinsonism Father   . Other Father 19       pacemaker  . Colon cancer Paternal Grandfather   . Heart failure Paternal Grandmother   . Stroke Maternal Grandmother   .  Esophageal cancer Neg Hx   . Rectal cancer Neg Hx   . Stomach cancer Neg Hx    Past Surgical History:  Procedure Laterality Date  . ABDOMINAL HYSTERECTOMY    . APPENDECTOMY    . BACK SURGERY    . CHOLECYSTECTOMY    . EXPLORATORY LAPAROTOMY    . GASTRIC BYPASS    . KNEE ARTHROSCOPY    . LITHOTRIPSY     Social History   Social History Narrative   Daily caffeine    Immunization History  Administered Date(s) Administered  . PFIZER(Purple Top)SARS-COV-2 Vaccination 07/27/2019, 08/17/2019, 02/02/2020     Objective: Vital Signs: There were no vitals taken for this visit.   Physical Exam   Musculoskeletal Exam: ***  CDAI Exam: CDAI Score: -- Patient Global: --; Provider Global: -- Swollen: --; Tender: -- Joint Exam 08/14/2020   No joint exam has been documented for this visit   There is currently no information documented on the homunculus. Go to the Rheumatology activity and complete the homunculus joint exam.  Investigation: No additional findings.  Imaging: No results found.  Recent Labs: Lab Results  Component Value Date   WBC 8.5 06/26/2020   HGB 14.5 06/26/2020   PLT 293 06/26/2020   NA 141 06/26/2020   K 4.9 06/26/2020   CL 103 06/26/2020   CO2 30 06/26/2020   GLUCOSE 84 06/26/2020   BUN 12 06/26/2020   CREATININE 0.76 06/26/2020   BILITOT 0.4 06/26/2020   ALKPHOS 94 11/09/2018   AST 26 06/26/2020  ALT 28 06/26/2020   PROT 7.3 06/26/2020   ALBUMIN 3.0 (L) 11/09/2018   CALCIUM 9.8 06/26/2020   GFRAA 95 06/26/2020   QFTBGOLDPLUS NEGATIVE 09/20/2019    Speciality Comments: No specialty comments available.  Procedures:  No procedures performed Allergies: Dilaudid [hydromorphone hcl] and Penicillins   Assessment / Plan:     Visit Diagnoses: No diagnosis found.  Orders: No orders of the defined types were placed in this encounter.  No orders of the defined types were placed in this encounter.   Face-to-face time spent with patient was  *** minutes. Greater than 50% of time was spent in counseling and coordination of care.  Follow-Up Instructions: No follow-ups on file.   Earnestine Mealing, CMA  Note - This record has been created using Editor, commissioning.  Chart creation errors have been sought, but may not always  have been located. Such creation errors do not reflect on  the standard of medical care.

## 2020-08-14 ENCOUNTER — Ambulatory Visit: Payer: Medicare Other | Admitting: Rheumatology

## 2020-08-14 DIAGNOSIS — M47816 Spondylosis without myelopathy or radiculopathy, lumbar region: Secondary | ICD-10-CM

## 2020-08-14 DIAGNOSIS — M19042 Primary osteoarthritis, left hand: Secondary | ICD-10-CM

## 2020-08-14 DIAGNOSIS — L4050 Arthropathic psoriasis, unspecified: Secondary | ICD-10-CM

## 2020-08-14 DIAGNOSIS — E559 Vitamin D deficiency, unspecified: Secondary | ICD-10-CM

## 2020-08-14 DIAGNOSIS — Z8719 Personal history of other diseases of the digestive system: Secondary | ICD-10-CM

## 2020-08-14 DIAGNOSIS — Z79899 Other long term (current) drug therapy: Secondary | ICD-10-CM

## 2020-08-14 DIAGNOSIS — Z8659 Personal history of other mental and behavioral disorders: Secondary | ICD-10-CM

## 2020-08-14 DIAGNOSIS — L409 Psoriasis, unspecified: Secondary | ICD-10-CM

## 2020-08-14 DIAGNOSIS — M170 Bilateral primary osteoarthritis of knee: Secondary | ICD-10-CM

## 2020-08-14 DIAGNOSIS — Z9884 Bariatric surgery status: Secondary | ICD-10-CM

## 2020-08-14 NOTE — Progress Notes (Signed)
Office Visit Note  Patient: Cassidy Reeves             Date of Birth: 1956-05-05           MRN: [id number]             PCP: Haywood Pao, MD Referring: Haywood Pao, MD Visit Date: 08/22/2020 Occupation: @GUAROCC @  Subjective:  Left knee joint pain    History of Present Illness: Cassidy Reeves is a 65 y.o. female has radiculitis, osteoarthritis, and DDD.  Patient is on Cosentyx 300 mg sq injections every 28 days.  She is due for her next cosentyx dose and should be receiving the shipment today.  She continues to have chronic pain in the left knee joint.  She states that she has had increased instability and buckling at times.  She is also noticed increased crepitus as well as swelling in the left knee.  She would like to proceed with a referral to Dr. Maureen Ralphs to discuss proceeding with a left knee replacement.  She has ongoing pain in the left [facility] joint and has been wearing a left CMC joint brace on a regular basis.  She is also noticed some increased aching weakness and both ankle joints and both feet.  She denies any joint swelling in her feet at this time.  She denies any Achilles tendinitis or plantar fasciitis.  She experiences intermittent pain in the right SI joint but is unsure if it is due to gait changes due to the severity of pain in her left knee joint. She has no active psoriasis at this time.    Activities of Daily Living:  Patient reports morning stiffness for 1.5-2 hours.   Patient Reports nocturnal pain.  Difficulty dressing/grooming: Denies Difficulty climbing stairs: Reports Difficulty getting out of chair: Denies Difficulty using hands for taps, buttons, cutlery, and/or writing: Denies  Review of Systems  Constitutional: Positive for fatigue.  HENT: Negative for mouth sores, mouth dryness and nose dryness.   Eyes: Negative for pain, itching and dryness.  Respiratory: Positive for shortness of breath. Negative for difficulty breathing.   Cardiovascular:  Negative for chest pain and palpitations.  Gastrointestinal: Negative for blood in stool, constipation and diarrhea.  Endocrine: Negative for increased urination.  Genitourinary: Negative for difficulty urinating.  Musculoskeletal: Positive for arthralgias, joint pain, joint swelling, myalgias, morning stiffness, muscle tenderness and myalgias.  Skin: Negative for color change, rash and redness.  Allergic/Immunologic: Negative for susceptible to infections.  Neurological: Positive for numbness. Negative for dizziness, headaches, memory loss and weakness.  Hematological: Negative for bruising/bleeding tendency.  Psychiatric/Behavioral: Negative for confusion.    PMFS History:  Patient Active Problem List   Diagnosis Date Noted  . UTI (urinary tract infection) 11/09/2018  . History of depression 05/22/2017  . Primary osteoarthritis of both hands 07/22/2016  . DDD lumbar spine 07/22/2016  . Trigger finger 07/10/2016  . 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 High risk medication use 06/13/2016  . Primary osteoarthritis of both knees 06/13/2016  . History of gastric bypass 06/13/2016  . Vitamin D deficiency 06/13/2016  . Psoriasis 02/18/2013  . 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 Anxiety   . Arthritis   . GERD (gastroesophageal reflux disease)   . History of kidney stones   . Pelvic mass   . UTI (urinary tract infection) 11/09/2018    Family History  Problem Relation Age of Onset  . Heart  disease Father   . Arthritis Father   . Psoriasis Father   . Parkinsonism Father   . Other Father 40       pacemaker  . Colon cancer Paternal Grandfather   . Heart failure Paternal Grandmother   . Stroke Maternal Grandmother   . Esophageal cancer Neg Hx   . Rectal cancer Neg Hx   . Stomach cancer Neg Hx    Past Surgical History:  Procedure Laterality Date  . ABDOMINAL HYSTERECTOMY    . APPENDECTOMY    . BACK SURGERY    .  CHOLECYSTECTOMY    . EXPLORATORY LAPAROTOMY    . GASTRIC BYPASS    . KNEE ARTHROSCOPY    . LITHOTRIPSY     Social History   Social History Narrative   Daily caffeine    Immunization History  Administered Date(s) Administered  . PFIZER(Purple Top)SARS-COV-2 Vaccination 07/27/2019, 08/17/2019, 02/02/2020     Objective: Vital Signs: BP (!) 158/84 (BP Location: Left Arm, Patient Position: Sitting, Cuff Size: Normal)   Pulse 86   Ht 5\' 6"  (1.676 m)   Wt 243 lb (110.2 kg)   BMI 39.22 kg/m    Physical Exam Vitals and nursing note reviewed.  Constitutional:      Appearance: She is well-developed.  HENT:     Head: Normocephalic and atraumatic.  Eyes:     Conjunctiva/sclera: Conjunctivae normal.  Pulmonary:     Effort: Pulmonary effort is normal.  Abdominal:     Palpations: Abdomen is soft.  Musculoskeletal:     Cervical back: Normal range of motion.  Skin:    General: Skin is warm and dry.     Capillary Refill: Capillary refill takes less than 2 seconds.  Neurological:     Mental Status: She is alert and oriented to person, place, and time.  Psychiatric:        Behavior: Behavior normal.      Musculoskeletal Exam: C-spine, thoracic spine, and lumbar spine good range of motion with no discomfort.  She has tenderness over the right SI joint.  No midline spinal tenderness was noted.  Shoulder joints, elbow joints, wrist joints, MCPs, PIPs, DIPs have good range of motion with no synovitis.  She has complete fist formation bilaterally.  Hip joints have good range of motion with no discomfort.  Painful range of motion with extension of the left knee joint noted on examination today.  No warmth or effusion was noted in either knee joint.  Ankle joints have good range of motion with no tenderness or inflammation.  No tenderness over MTP joints.  No achilles tendonitis or plantar fasciitis.    CDAI Exam: CDAI Score: -- Patient Global: --; Provider Global: -- Swollen: --; Tender:  -- Joint Exam 08/22/2020   No joint exam has been documented for this visit   There is currently no information documented on the homunculus. Go to the Rheumatology activity and complete the homunculus joint exam.  Investigation: No additional findings.  Imaging: No results found.  Recent Labs: Lab Results  Component Value Date   WBC 8.5 06/26/2020   HGB 14.5 06/26/2020   PLT 293 06/26/2020   NA 141 06/26/2020   K 4.9 06/26/2020   CL 103 06/26/2020   CO2 30 06/26/2020   GLUCOSE 84 06/26/2020   BUN 12 06/26/2020   CREATININE 0.76 06/26/2020   BILITOT 0.4 06/26/2020   ALKPHOS 94 11/09/2018   AST 26 06/26/2020   ALT 28 06/26/2020   PROT 7.3 06/26/2020  ALBUMIN 3.0 (L) 11/09/2018   CALCIUM 9.8 06/26/2020   GFRAA 95 06/26/2020   QFTBGOLDPLUS NEGATIVE 09/20/2019    Speciality Comments: No specialty comments available.  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  She has not had any recent psoriatic arthritis flares.  She is clinically doing well on Cosentyx 300 mg subcutaneous injections every 28 days.  She is due for her next Cosentyx injection and is expecting a shipment of medication to arrive today.  She has been experiencing some increased aching and weakness in both ankle joints but has no inflammation on examination today.  She has no Achilles tendinitis or plantar fasciitis.  She continues to have chronic pain in the left knee joint but no warmth or effusion was noted.  She has tenderness palpation over the right SI joint on exam.  X-rays of both hands and feet were obtained today to assess for radiographic progression.  She has no active psoriasis at this time.  She will continue on Cosentyx as prescribed.  She was advised to notify us if she develops increased joint pain or joint swelling.  She will  follow-up in the office in 5 months.  Plan: XR Hand 2 View Right, XR Hand 2 View Left, XR Foot 2 Views Right, XR Foot 2 Views Left  Psoriasis: She has no active psoriasis at this time.  She has not needed to use clobetasol cream topically recently.  High risk medication use - Cosentyx 300 mg subcutaneous injections every 28 days. CBC and CMP were within normal limits on 06/26/2020.  Her lab work is due in May and every 3 months to monitor for drug toxicity.  TB gold negative on 09/20/2019.  Future order for TB gold placed today. - Plan: QuantiFERON-TB Gold Plus She has had 3 pfzier covid-19 vaccines.  Discussed that she would be eligible to receive the fourth vaccine. She has not had any recent infections.  She was advised to hold Cosentyx if she develops signs or symptoms of an infection and to resume once infection has completely cleared. We discussed that she will need to plan to schedule the left total knee arthroplasty surgery 5 weeks after her last Cosentyx dose.  She will need clearance by Dr. Maureen Ralphs prior to resuming Cosentyx during the postoperative period.   Screening for tuberculosis -Future order for TB gold was placed today.  Plan: QuantiFERON-TB Gold Plus  Primary osteoarthritis of both hands: She has PIP and DIP thickening consistent with osteoarthritis of both hands.  She has tenderness and thickening over the left CMC joint.  She has been wearing a left CMC joint brace as needed.  We discussed the importance of joint protection and muscle strengthening.  Primary osteoarthritis of both knees: She is not experiencing any discomfort in her right knee joint at this time.  She has good range of motion of the right knee with no warmth or effusion.  She has chronic pain in the left knee joint.  She has been experiencing increased instability and buckling in the left knee joint intermittently.  She has also noticed intermittent joint swelling and nocturnal pain.  She would like a referral to Dr.  Wynelle Link to discuss proceeding with a left total knee arthroplasty.  We discussed the importance of lower extremity muscle strengthening.  DDD lumbar spine: She experiences intermittent lower back pain.  She  has no midline spinal tenderness at this time.  She has some tenderness over the right SI joint.  She has not had any symptoms of radiculopathy.  Other medical conditions are listed as follows:  Vitamin D deficiency: She is taking a vitamin D supplement on a daily basis.  History of anxiety: She discontinued Cymbalta.  She takes Xanax as needed for anxiety.  History of depression: She discontinued Cymbalta since her last office visit.  She has not noticed any increased depression.  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 XR Hand 2 View Right  . XR Hand 2 View Left  . XR Foot 2 Views Right  . XR Foot 2 Views Left  . QuantiFERON-TB Gold Plus   No orders of the defined types were placed in this encounter.    Follow-Up Instructions: Return in about 5 months (around 01/22/2021) for Psoriatic arthritis, Osteoarthritis, DDD.   Ofilia Neas, PA-C  Note - This record has been created using Dragon software.  Chart creation errors have been sought, but may not always  have been located. Such creation errors do not reflect on  the standard of medical care.

## 2020-08-17 ENCOUNTER — Other Ambulatory Visit (HOSPITAL_COMMUNITY): Payer: Self-pay

## 2020-08-22 ENCOUNTER — Ambulatory Visit: Payer: Self-pay

## 2020-08-22 ENCOUNTER — Other Ambulatory Visit: Payer: Self-pay

## 2020-08-22 ENCOUNTER — Encounter: Payer: Self-pay | Admitting: Physician Assistant

## 2020-08-22 ENCOUNTER — Ambulatory Visit (INDEPENDENT_AMBULATORY_CARE_PROVIDER_SITE_OTHER): Payer: Medicare Other | Admitting: Physician Assistant

## 2020-08-22 VITALS — BP 158/84 | HR 86 | Ht 66.0 in | Wt 243.0 lb

## 2020-08-22 DIAGNOSIS — Z8659 Personal history of other mental and behavioral disorders: Secondary | ICD-10-CM

## 2020-08-22 DIAGNOSIS — M79671 Pain in right foot: Secondary | ICD-10-CM

## 2020-08-22 DIAGNOSIS — E559 Vitamin D deficiency, unspecified: Secondary | ICD-10-CM

## 2020-08-22 DIAGNOSIS — Z8719 Personal history of other diseases of the digestive system: Secondary | ICD-10-CM

## 2020-08-22 DIAGNOSIS — M19041 Primary osteoarthritis, right hand: Secondary | ICD-10-CM

## 2020-08-22 DIAGNOSIS — L4050 Arthropathic psoriasis, unspecified: Secondary | ICD-10-CM

## 2020-08-22 DIAGNOSIS — Z79899 Other long term (current) drug therapy: Secondary | ICD-10-CM

## 2020-08-22 DIAGNOSIS — M47816 Spondylosis without myelopathy or radiculopathy, lumbar region: Secondary | ICD-10-CM

## 2020-08-22 DIAGNOSIS — M19042 Primary osteoarthritis, left hand: Secondary | ICD-10-CM | POA: Diagnosis not present

## 2020-08-22 DIAGNOSIS — L409 Psoriasis, unspecified: Secondary | ICD-10-CM | POA: Diagnosis not present

## 2020-08-22 DIAGNOSIS — Z111 Encounter for screening for respiratory tuberculosis: Secondary | ICD-10-CM

## 2020-08-22 DIAGNOSIS — Z9884 Bariatric surgery status: Secondary | ICD-10-CM

## 2020-08-22 DIAGNOSIS — M79672 Pain in left foot: Secondary | ICD-10-CM | POA: Diagnosis not present

## 2020-08-22 DIAGNOSIS — M170 Bilateral primary osteoarthritis of knee: Secondary | ICD-10-CM

## 2020-08-22 NOTE — Patient Instructions (Signed)
Standing Labs We placed an order today for your standing lab work.   Please have your standing labs drawn in May and every 3 months   If possible, please have your labs drawn 2 weeks prior to your appointment so that the provider can discuss your results at your appointment.  We have open lab daily Monday through Thursday from 1:30-4:30 PM and Friday from 1:30-4:00 PM at the office of Dr. Bo Merino, [facility].   Please be advised, all patients with office appointments requiring lab work will take precedents over walk-in lab work.  If possible, please come for your lab work on Monday and Friday afternoons, as you may experience shorter wait times. The office is located at [address] [place], [place], [place] [address] No appointment is necessary.   Labs are drawn by [facility]. Please bring your co-pay at the time of your lab draw.  You may receive a bill from [facility] for your lab work.  If you wish to have your labs drawn at another location, please call the office 24 hours in advance to send orders.  If you have any questions regarding directions or hours of operation,  please call [phone].   As a reminder, please drink plenty of water prior to coming for your lab work. Thanks!

## 2020-08-22 NOTE — Progress Notes (Signed)
X-rays are consistent with osteoarthritic changes in both hands and both feet.   No erosive changes noted. Recommend repeating x-rays in 2-3 years to assess for radiographic progression.

## 2020-08-22 NOTE — Addendum Note (Signed)
Addended by: Earnestine Mealing on: 08/22/2020 04:54 PM   Modules accepted: Orders

## 2020-09-13 ENCOUNTER — Other Ambulatory Visit: Payer: Self-pay | Admitting: *Deleted

## 2020-09-13 MED ORDER — COSENTYX SENSOREADY (300 MG) 150 MG/ML ~~LOC~~ SOAJ: SUBCUTANEOUS | 0 refills | Status: DC

## 2020-09-13 NOTE — Telephone Encounter (Signed)
RX faxed from [facility] by [employer]  Next Visit: 01/30/2021  Last Visit: 08/22/2020  Last Fill: 07/14/2020  DX: Psoriatic arthropathy   Current Dose per office note 08/22/2020, Cosentyx 300 mg subcutaneous injections every 28 days  Labs: 06/26/2020, CBC and CMP WNL  TB Gold: 09/20/2019, negative  Okay to refill Cosentyx?

## 2020-10-06 ENCOUNTER — Telehealth: Payer: Self-pay

## 2020-10-06 NOTE — Telephone Encounter (Signed)
Patient called requesting prescription refill of Cosentyx to be sent to [facility].  Patient also requested a return call to let her know if she is due for labwork.

## 2020-10-06 NOTE — Telephone Encounter (Addendum)
I called patient, RX for Cosentyx sent 08/2020 for 90 day supply to [facility], too soon for refill, labs due now.

## 2020-10-09 ENCOUNTER — Encounter: Payer: Self-pay | Admitting: Physician Assistant

## 2020-11-02 ENCOUNTER — Other Ambulatory Visit: Payer: Self-pay

## 2020-11-02 ENCOUNTER — Ambulatory Visit (INDEPENDENT_AMBULATORY_CARE_PROVIDER_SITE_OTHER): Payer: Medicare Other | Admitting: Physician Assistant

## 2020-11-02 ENCOUNTER — Encounter: Payer: Self-pay | Admitting: Physician Assistant

## 2020-11-02 VITALS — BP 122/72 | HR 70 | Ht 66.0 in | Wt 242.0 lb

## 2020-11-02 DIAGNOSIS — R195 Other fecal abnormalities: Secondary | ICD-10-CM | POA: Diagnosis not present

## 2020-11-02 DIAGNOSIS — K219 Gastro-esophageal reflux disease without esophagitis: Secondary | ICD-10-CM | POA: Diagnosis not present

## 2020-11-02 MED ORDER — NA SULFATE-K SULFATE-MG SULF 17.5-3.13-1.6 GM/177ML PO SOLN: 1.0000 | Freq: Once | ORAL | 0 refills | Status: AC

## 2020-11-02 NOTE — Patient Instructions (Signed)
If you are age 66 or older, your body mass index should be between 23-30. Your Body mass index is 39.06 kg/m. If this is out of the aforementioned range listed, please consider follow up with your Primary Care Provider.  If you are age 24 or younger, your body mass index should be between 19-25. Your Body mass index is 39.06 kg/m. If this is out of the aformentioned range listed, please consider follow up with your Primary Care Provider.   You have been scheduled for a colonoscopy. Please follow written instructions given to you at your visit today.  Please pick up your prep supplies at the pharmacy within the next 1-3 days. If you use inhalers (even only as needed), please bring them with you on the day of your procedure.   The [facility] providers would like to encourage you to use [facility] to communicate with providers for non-urgent requests or questions.  Due to long hold times on the telephone, sending your provider a message by [facility] may be a faster and more efficient way to get a response.  Please allow 48 business hours for a response.  Please remember that this is for non-urgent requests.   Thank you for choosing me and [facility].  Ellouise Newer, PA-C

## 2020-11-02 NOTE — Progress Notes (Signed)
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 dyspepsia, significant belching August 2013: EGD Dr. Ardis Hughs  August 2013 showed normal Roux-en-Y gastric bypass anatomy, the gastric remnant contained a single small erosion. Biopsies of the stomach were negative for H. pylori. She was started on Carafate twice daily  HPI:    Cassidy Reeves is a 65 year old female with a past medical history of anxiety and reflux as well as others listed below, known to Dr. Ardis Hughs, who was referred to me by Tisovec, Fransico Him, MD for a complaint of positive Hemoccult.      02/10/2012 office visit Dr. Ardis Hughs for bothersome belching.  She had recently had an EGD as above.  That time discussed Gas-X with every meal and a PPI once daily.  Carafate stopped.  Also ordered upper GI with small bowel follow-through.    Today, the patient presents to clinic and tells me she was having her annual visit with her PCP and they checked her stool for blood and it was positive.  He told her it was time to have a colonoscopy.  Tells me she has no GI complaints or concerns.    Does have reflux about 1 time a month regardless of her Nexium 40 mg once daily but for the most part this is under good control.    Denies fever, chills, weight loss, seeing blood in her stool, change in bowel habits or abdominal pain.     Past Medical History:  Diagnosis Date   Anxiety    Arthritis    GERD (gastroesophageal reflux disease)    History of kidney stones    Pelvic mass    UTI (urinary tract infection) 11/09/2018    Past Surgical History:  Procedure Laterality Date   ABDOMINAL HYSTERECTOMY     APPENDECTOMY     BACK SURGERY     CHOLECYSTECTOMY     EXPLORATORY LAPAROTOMY     GASTRIC BYPASS     KNEE ARTHROSCOPY     LITHOTRIPSY      Current Outpatient Medications  Medication Sig Dispense Refill   ALPRAZolam (XANAX) 0.5 MG tablet Take 0.5 mg by mouth  daily as needed for anxiety.      diclofenac sodium (VOLTAREN) 1 % GEL Apply 2 to 4 g to affected area up to 4 times daily as needed. 350 g 4   esomeprazole (NEXIUM) 40 MG capsule Take 40 mg by mouth daily.     hydrocortisone 2.5 % cream as needed.      hydrOXYzine (ATARAX/VISTARIL) 25 MG tablet Take 25 mg by mouth 3 (three) times daily as needed for itching.     IBUPROFEN PO Take by mouth as needed.     Secukinumab, 300 MG Dose, (COSENTYX SENSOREADY, 300 MG,) 150 MG/ML SOAJ INJECT 300 MG INTO THE SKIN EVERY 28 DAYS 6 mL 0   VITAMIN D PO Take 1 tablet by mouth daily.     zolpidem (AMBIEN) 10 MG tablet Take 10 mg by mouth at bedtime as needed for sleep. At bedtime as needed      No current facility-administered medications for this visit.    Allergies as of 11/02/2020 - Review Complete 08/22/2020  Allergen Reaction Noted   Dilaudid [hydromorphone hcl]  11/26/2011   Penicillins  11/26/2011    Family History  Problem Relation Age of Onset   Heart disease Father    Arthritis Father    Psoriasis Father  Parkinsonism Father    Other Father 51       pacemaker   Colon cancer Paternal Grandfather    Heart failure Paternal Grandmother    Stroke Maternal Grandmother    Esophageal cancer Neg Hx    Rectal cancer Neg Hx    Stomach cancer Neg Hx     Social History   Socioeconomic History   Marital status: Married    Spouse name: Not on file   Number of children: 2   Years of education: Not on file   Highest education level: Not on file  Occupational History    Employer: [employer]  Tobacco Use   Smoking status: Never   Smokeless tobacco: Never  Vaping Use   Vaping Use: Never used  Substance and Sexual Activity   Alcohol use: Yes    Comment: occasionally   Drug use: Never   Sexual activity: Not on file  Other Topics Concern   Not on file  Social History Narrative   Daily caffeine    Social Determinants of Health   Financial Resource Strain: Not on file  Food  Insecurity: Not on file  Transportation Needs: Not on file  Physical Activity: Not on file  Stress: Not on file  Social Connections: Not on file  Intimate Partner Violence: Not on file    Review of Systems:    Constitutional: No weight loss, fever or chills Skin: No rash  Cardiovascular: No chest pain Respiratory: No SOB  Gastrointestinal: See HPI and otherwise negative Genitourinary: No dysuria Neurological: No headache, dizziness or syncope Musculoskeletal: No new muscle or joint pain Hematologic: No bleeding Psychiatric: No history of depression or anxiety   Physical Exam:  Vital signs: BP 122/72   Pulse 70   Ht 5\' 6"  (1.676 m)   Wt 242 lb (109.8 kg)   SpO2 98%   BMI 39.06 kg/m    Constitutional:   Pleasant obese Caucasian female appears to be in NAD, Well developed, Well nourished, alert and cooperative Head:  Normocephalic and atraumatic. Eyes:   PEERL, EOMI. No icterus. Conjunctiva pink. Ears:  Normal auditory acuity. Neck:  Supple Throat: Oral cavity and pharynx without inflammation, swelling or lesion.  Respiratory: Respirations even and unlabored. Lungs clear to auscultation bilaterally.   No wheezes, crackles, or rhonchi.  Cardiovascular: Normal S1, S2. No MRG. Regular rate and rhythm. No peripheral edema, cyanosis or pallor.  Gastrointestinal:  Soft, nondistended, nontender. No rebound or guarding. Normal bowel sounds. No appreciable masses or hepatomegaly. Rectal:  Not performed.  Msk:  Symmetrical without gross deformities. Without edema, no deformity or joint abnormality.  Neurologic:  Alert and  oriented x4;  grossly normal neurologically.  Skin:   Dry and intact without significant lesions or rashes. Psychiatric:  Demonstrates good judgement and reason without abnormal affect or behaviors.  RELEVANT LABS AND IMAGING: CBC    Component Value Date/Time   WBC 8.5 06/26/2020 1605   RBC 5.08 06/26/2020 1605   HGB 14.5 06/26/2020 1605   HCT 43.2 06/26/2020  1605   PLT 293 06/26/2020 1605   MCV 85.0 06/26/2020 1605   MCH 28.5 06/26/2020 1605   MCHC 33.6 06/26/2020 1605   RDW 13.0 06/26/2020 1605   LYMPHSABS 2,907 06/26/2020 1605   MONOABS 0.9 11/12/2018 0514   EOSABS 315 06/26/2020 1605   BASOSABS 111 06/26/2020 1605    CMP     Component Value Date/Time   NA 141 06/26/2020 1605   K 4.9 06/26/2020 1605  CL 103 06/26/2020 1605   CO2 30 06/26/2020 1605   GLUCOSE 84 06/26/2020 1605   BUN 12 06/26/2020 1605   CREATININE 0.76 06/26/2020 1605   CALCIUM 9.8 06/26/2020 1605   PROT 7.3 06/26/2020 1605   ALBUMIN 3.0 (L) 11/09/2018 1050   AST 26 06/26/2020 1605   ALT 28 06/26/2020 1605   ALKPHOS 94 11/09/2018 1050   BILITOT 0.4 06/26/2020 1605   GFRNONAA 82 06/26/2020 1605   GFRAA 95 06/26/2020 1605    Assessment: 1.  Positive Hemoccult: At her annual visit (we do not have records), last Cologuard within the past 5 years negative per patient, colonoscopy when she turned 5 (which she thinks was normal) 2.  GERD: Under moderate control with Nexium 40 mg daily  Plan: 1.  Scheduled patient for diagnostic colonoscopy in the [place] with Dr. Ardis Hughs for positive Hemoccult.  Did provide the patient with a detailed list of risks for the procedure and she agrees to proceed. 2.  We will try to request recent records including positive Hemoccult from PCP. 3.  Patient to continue Nexium 40 mg daily 4.  Patient to follow in clinic per recommendations from Dr. Ardis Hughs after time of procedure.  Cassidy Newer, PA-C [facility] 11/02/2020, 10:30 AM  Cc: Haywood Pao, MD

## 2020-11-05 NOTE — Progress Notes (Signed)
I agree with the above note, plan 

## 2020-11-07 ENCOUNTER — Encounter: Payer: Self-pay | Admitting: Gastroenterology

## 2020-11-07 ENCOUNTER — Ambulatory Visit (AMBULATORY_SURGERY_CENTER): Payer: Medicare Other | Admitting: Gastroenterology

## 2020-11-07 ENCOUNTER — Other Ambulatory Visit: Payer: Self-pay

## 2020-11-07 VITALS — BP 118/63 | HR 65 | Temp 97.3°F | Resp 14 | Ht 67.0 in | Wt 242.0 lb

## 2020-11-07 DIAGNOSIS — K648 Other hemorrhoids: Secondary | ICD-10-CM | POA: Diagnosis not present

## 2020-11-07 DIAGNOSIS — K635 Polyp of colon: Secondary | ICD-10-CM | POA: Diagnosis not present

## 2020-11-07 DIAGNOSIS — R195 Other fecal abnormalities: Secondary | ICD-10-CM | POA: Diagnosis present

## 2020-11-07 DIAGNOSIS — D125 Benign neoplasm of sigmoid colon: Secondary | ICD-10-CM

## 2020-11-07 MED ORDER — SODIUM CHLORIDE 0.9 % IV SOLN: 500.0000 mL | Freq: Once | INTRAVENOUS | Status: DC

## 2020-11-07 NOTE — Op Note (Signed)
[facility] Patient Name: Cassidy Reeves Procedure Date: 11/07/2020 7:52 AM MRN: [id number] Endoscopist: Milus Banister , MD Age: 65 Referring MD:  Date of Birth: 11-15-1955 Gender: Female Account #: [id number] Procedure:                Colonoscopy Indications:              Heme positive stool Medicines:                Monitored Anesthesia Care Procedure:                Pre-Anesthesia Assessment:                           - Prior to the procedure, a History and Physical                            was performed, and patient medications and                            allergies were reviewed. [name] tolerance of                            previous anesthesia was also reviewed. The risks                            and benefits of the procedure and the sedation                            options and risks were discussed with the patient.                            All questions were answered, and informed consent                            was obtained. Prior Anticoagulants: The patient has                            taken no previous anticoagulant or antiplatelet                            agents. ASA Grade Assessment: II - A patient with                            mild systemic disease. After reviewing the risks                            and benefits, the patient was deemed in                            satisfactory condition to undergo the procedure.                           After obtaining informed consent, the colonoscope  was passed under direct vision. Throughout the                            procedure, the patient's blood pressure, pulse, and                            oxygen saturations were monitored continuously. The                            Olympus CF-HQ190 [id number]) Colonoscope was                            introduced through the anus and advanced to the the                            cecum, identified by appendiceal orifice and                             ileocecal valve. The colonoscopy was performed                            without difficulty. The patient tolerated the                            procedure well. The quality of the bowel                            preparation was good. The ileocecal valve,                            appendiceal orifice, and rectum were photographed. Scope In: 8:04:33 AM Scope Out: 8:19:53 AM Scope Withdrawal Time: 0 hours 10 minutes 23 seconds  Total Procedure Duration: 0 hours 15 minutes 20 seconds  Findings:                 Two sessile polyps were found in the sigmoid colon.                            The polyps were 4 to 5 mm in size. These polyps                            were removed with a cold snare. Resection and                            retrieval were complete.                           Internal hemorrhoids were found. The hemorrhoids                            were small.                           The exam was otherwise without abnormality on  direct and retroflexion views. Complications:            No immediate complications. Estimated blood loss:                            None. Estimated Blood Loss:     Estimated blood loss: none. Impression:               - Two 4 to 5 mm polyps in the sigmoid colon,                            removed with a cold snare. Resected and retrieved.                           - Internal hemorrhoids.                           - The examination was otherwise normal on direct                            and retroflexion views. Recommendation:           - Patient has a contact number available for                            emergencies. The signs and symptoms of potential                            delayed complications were discussed with the                            patient. Return to normal activities tomorrow.                            Written discharge instructions were provided to the                             patient.                           - Resume previous diet.                           - Continue present medications.                           - Await pathology results. Milus Banister, MD 11/07/2020 8:21:47 AM This report has been signed electronically.

## 2020-11-07 NOTE — Progress Notes (Signed)
PT taken to PACU. Monitors in place. VSS. Report given to RN. 

## 2020-11-07 NOTE — Patient Instructions (Signed)
Handout on polyps and hemorrhoids given.    YOU HAD AN ENDOSCOPIC PROCEDURE TODAY AT [facility]:   Refer to the procedure report that was given to you for any specific questions about what was found during the examination.  If the procedure report does not answer your questions, please call your gastroenterologist to clarify.  If you requested that your care partner not be given the details of your procedure findings, then the procedure report has been included in a sealed envelope for you to review at your convenience later.  YOU SHOULD EXPECT: Some feelings of bloating in the abdomen. Passage of more gas than usual.  Walking can help get rid of the air that was put into your GI tract during the procedure and reduce the bloating. If you had a lower endoscopy (such as a colonoscopy or flexible sigmoidoscopy) you may notice spotting of blood in your stool or on the toilet paper. If you underwent a bowel prep for your procedure, you may not have a normal bowel movement for a few days.  Please Note:  You might notice some irritation and congestion in your nose or some drainage.  This is from the oxygen used during your procedure.  There is no need for concern and it should clear up in a day or so.  SYMPTOMS TO REPORT IMMEDIATELY:  Following lower endoscopy (colonoscopy or flexible sigmoidoscopy):  Excessive amounts of blood in the stool  Significant tenderness or worsening of abdominal pains  Swelling of the abdomen that is new, acute  Fever of 100F or higher   For urgent or emergent issues, a gastroenterologist can be reached at any hour by calling [phone]. Do not use MyChart messaging for urgent concerns.    DIET:  We do recommend a small meal at first, but then you may proceed to your regular diet.  Drink plenty of fluids but you should avoid alcoholic beverages for 24 hours.  ACTIVITY:  You should plan to take it easy for the rest of today and you should NOT DRIVE  or use heavy machinery until tomorrow (because of the sedation medicines used during the test).    FOLLOW UP: Our staff will call the number listed on your records 48-72 hours following your procedure to check on you and address any questions or concerns that you may have regarding the information given to you following your procedure. If we do not reach you, we will leave a message.  We will attempt to reach you two times.  During this call, we will ask if you have developed any symptoms of COVID 19. If you develop any symptoms (ie: fever, flu-like symptoms, shortness of breath, cough etc.) before then, please call [phone].  If you test positive for Covid 19 in the 2 weeks post procedure, please call and report this information to [place].    If any biopsies were taken you will be contacted by phone or by letter within the next 1-3 weeks.  Please call us at [phone] if you have not heard about the biopsies in 3 weeks.    SIGNATURES/CONFIDENTIALITY: You and/or your care partner have signed paperwork which will be entered into your electronic medical record.  These signatures attest to the fact that that the information above on your After Visit Summary has been reviewed and is understood.  Full responsibility of the confidentiality of this discharge information lies with you and/or your care-partner.

## 2020-11-07 NOTE — Progress Notes (Signed)
Pt's states no medical or surgical changes since previsit or office visit. 

## 2020-11-07 NOTE — Progress Notes (Signed)
Called to room to assist during endoscopic procedure.  Patient ID and intended procedure confirmed with present staff. Received instructions for my participation in the procedure from the performing physician.  

## 2020-11-07 NOTE — Progress Notes (Signed)
C.W. vital signs. 

## 2020-11-09 ENCOUNTER — Telehealth: Payer: Self-pay | Admitting: *Deleted

## 2020-11-09 NOTE — Telephone Encounter (Signed)
  Follow up Call-  Call back number 11/07/2020  Post procedure Call Back phone  # [phone]  Permission to leave phone message Yes  Some recent data might be hidden     Patient questions:  Do you have a fever, pain , or abdominal swelling? No. Pain Score  0 *  Have you tolerated food without any problems? Yes.    Have you been able to return to your normal activities? Yes.    Do you have any questions about your discharge instructions: Diet   No. Medications  No. Follow up visit  No.  Do you have questions or concerns about your Care? No.  Actions: * If pain score is 4 or above: No action needed, pain <4.Have you developed a fever since your procedure? no  2.   Have you had an respiratory symptoms (SOB or cough) since your procedure? no  3.   Have you tested positive for COVID 19 since your procedure no  4.   Have you had any family members/close contacts diagnosed with the COVID 19 since your procedure?  no   If yes to any of these questions please route to Joylene John, RN and Joella Prince, RN

## 2020-11-14 ENCOUNTER — Encounter: Payer: Self-pay | Admitting: Gastroenterology

## 2020-11-17 ENCOUNTER — Telehealth: Payer: Self-pay

## 2020-11-17 NOTE — Telephone Encounter (Signed)
Patient left a voicemail stating she is scheduled for knee replacement surgery on 12/26/20 and is checking if she should take her Cosentyx injection in July.  Patient requested a return call.

## 2020-11-17 NOTE — Telephone Encounter (Signed)
Dr. Estanislado Pandy completed surgery clearance and advised "discontinue Cosentyx 1 month prior to the surgery. May restart cosentyx 2 weeks after the surgery if no infection" advised patient and she verbalized understanding.   Surgery clearance was faxed to Dr. Aluiscio's office and copy has been sent to the scan center.

## 2020-11-22 NOTE — Telephone Encounter (Signed)
Follow up call made. 

## 2020-12-05 ENCOUNTER — Other Ambulatory Visit: Payer: Self-pay | Admitting: *Deleted

## 2020-12-05 DIAGNOSIS — Z111 Encounter for screening for respiratory tuberculosis: Secondary | ICD-10-CM

## 2020-12-05 DIAGNOSIS — Z0189 Encounter for other specified special examinations: Secondary | ICD-10-CM

## 2020-12-05 DIAGNOSIS — Z79899 Other long term (current) drug therapy: Secondary | ICD-10-CM

## 2020-12-06 NOTE — Progress Notes (Signed)
INR 1.0.  Please notify the patient and forward results to requested provider.

## 2020-12-08 LAB — CBC WITH DIFFERENTIAL/PLATELET
Absolute Monocytes: 714 cells/uL (ref 200–950)
Basophils Absolute: 100 cells/uL (ref 0–200)
Basophils Relative: 1.2 %
Eosinophils Absolute: 141 cells/uL (ref 15–500)
Eosinophils Relative: 1.7 %
HCT: 40.9 % (ref 35.0–45.0)
Hemoglobin: 13.6 g/dL (ref 11.7–15.5)
Lymphs Abs: 3021 cells/uL (ref 850–3900)
MCH: 27.9 pg (ref 27.0–33.0)
MCHC: 33.3 g/dL (ref 32.0–36.0)
MCV: 83.8 fL (ref 80.0–100.0)
MPV: 10.8 fL (ref 7.5–12.5)
Monocytes Relative: 8.6 %
Neutro Abs: 4324 cells/uL (ref 1500–7800)
Neutrophils Relative %: 52.1 %
Platelets: 313 10*3/uL (ref 140–400)
RBC: 4.88 10*6/uL (ref 3.80–5.10)
RDW: 13.2 % (ref 11.0–15.0)
Total Lymphocyte: 36.4 %
WBC: 8.3 10*3/uL (ref 3.8–10.8)

## 2020-12-08 LAB — PROTIME-INR
INR: 1
Prothrombin Time: 10.1 s (ref 9.0–11.5)

## 2020-12-08 LAB — COMPLETE METABOLIC PANEL WITH GFR
AG Ratio: 1.4 (calc) (ref 1.0–2.5)
ALT: 23 U/L (ref 6–29)
AST: 24 U/L (ref 10–35)
Albumin: 4.4 g/dL (ref 3.6–5.1)
Alkaline phosphatase (APISO): 87 U/L (ref 37–153)
BUN: 14 mg/dL (ref 7–25)
CO2: 26 mmol/L (ref 20–32)
Calcium: 9.8 mg/dL (ref 8.6–10.4)
Chloride: 101 mmol/L (ref 98–110)
Creat: 0.7 mg/dL (ref 0.50–1.05)
Globulin: 3.2 g/dL (calc) (ref 1.9–3.7)
Glucose, Bld: 82 mg/dL (ref 65–99)
Potassium: 3.9 mmol/L (ref 3.5–5.3)
Sodium: 137 mmol/L (ref 135–146)
Total Bilirubin: 0.4 mg/dL (ref 0.2–1.2)
Total Protein: 7.6 g/dL (ref 6.1–8.1)
eGFR: 96 mL/min/{1.73_m2} (ref >=60)

## 2020-12-08 LAB — QUANTIFERON-TB GOLD PLUS
Mitogen-NIL: >10 IU/mL
NIL: 0.02 IU/mL
QuantiFERON-TB Gold Plus: NEGATIVE
TB1-NIL: 0.01 IU/mL
TB2-NIL: 0.01 IU/mL

## 2020-12-11 NOTE — Progress Notes (Signed)
TB gold negative

## 2020-12-20 ENCOUNTER — Other Ambulatory Visit: Payer: Self-pay | Admitting: *Deleted

## 2020-12-20 MED ORDER — COSENTYX SENSOREADY (300 MG) 150 MG/ML ~~LOC~~ SOAJ: SUBCUTANEOUS | 0 refills | Status: DC

## 2020-12-20 NOTE — Telephone Encounter (Signed)
RX faxed from [facility] by [employer]   Next Visit: 01/30/2021   Last Visit: 08/22/2020   Last Fill: 09/13/2020   DX: Psoriatic arthropathy    Current Dose per office note 08/22/2020, Cosentyx 300 mg subcutaneous injections every 28 days   Labs: 12/05/2020, CBC and CMP WNL   TB Gold: 12/05/2020, Negative  Okay to refill Cosentyx?

## 2020-12-26 HISTORY — PX: REPLACEMENT TOTAL KNEE: SUR1224

## 2021-01-08 ENCOUNTER — Telehealth: Payer: Self-pay

## 2021-01-08 NOTE — Telephone Encounter (Signed)
Patient left a voicemail requesting prescription refill of Cosentyx.

## 2021-01-08 NOTE — Telephone Encounter (Signed)
A refill was sent to the pharmacy on 12/20/2020 for a 90 day supply. Advised patient to contact [facility] to schedule shipment and she verbalized understanding.

## 2021-01-16 NOTE — Progress Notes (Signed)
Office Visit Note  Patient: Cassidy Reeves             Date of Birth: 1956/02/02           MRN: [id number]             PCP: Haywood Pao, MD Referring: Haywood Pao, MD Visit Date: 01/30/2021 Occupation: '@GUAROCC'$ @  Subjective:  Medication management.   History of Present Illness: Cassidy Reeves is a 65 y.o. female with a history of psoriatic arthritis, osteoarthritis and degenerative disc disease.  She states she had left total knee replacement on December 26, 2020 by Dr.Alusio.  She continues to do well.  She states she also has some discomfort in her left thumb.  She came off Cosentyx for 1 month prior to the surgery and restarted 2 weeks after.  She did not have a flare during that time.  She has no active psoriasis lesions currently.  Activities of Daily Living:  Patient reports morning stiffness for 1-1.5 hours.   Patient Reports nocturnal pain.  Difficulty dressing/grooming: Denies Difficulty climbing stairs: Reports Difficulty getting out of chair: Reports Difficulty using hands for taps, buttons, cutlery, and/or writing: Denies  Review of Systems  Constitutional:  Positive for fatigue. Negative for night sweats, weight gain and weight loss.  HENT:  Negative for mouth sores, trouble swallowing, trouble swallowing, mouth dryness and nose dryness.   Eyes:  Negative for pain, redness, itching, visual disturbance and dryness.  Respiratory:  Negative for cough, shortness of breath and difficulty breathing.   Cardiovascular:  Negative for chest pain, palpitations, hypertension, irregular heartbeat and swelling in legs/feet.  Gastrointestinal:  Negative for blood in stool, constipation and diarrhea.  Endocrine: Negative for increased urination.  Genitourinary:  Negative for difficulty urinating and vaginal dryness.  Musculoskeletal:  Positive for joint pain, joint pain, joint swelling and morning stiffness. Negative for myalgias, muscle weakness, muscle tenderness and myalgias.   Skin:  Negative for color change, rash, hair loss, redness, skin tightness, ulcers and sensitivity to sunlight.  Allergic/Immunologic: Negative for susceptible to infections.  Neurological:  Negative for dizziness, numbness, headaches, memory loss, night sweats and weakness.  Hematological:  Negative for bruising/bleeding tendency and swollen glands.  Psychiatric/Behavioral:  Negative for depressed mood, confusion and sleep disturbance. The patient is not nervous/anxious.    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    Past Medical History:  Diagnosis Date   Anxiety    Arthritis    GERD (gastroesophageal reflux disease)    History of kidney stones    Pelvic mass    RA (rheumatoid arthritis) ([place])    UTI (urinary tract infection) 11/09/2018    Family History  Problem Relation Age of Onset   Heart disease Father    Arthritis Father    Psoriasis Father    Parkinsonism Father    Other Father 11       pacemaker   Kidney disease Maternal Uncle    Stroke Maternal Grandmother    Heart failure  Paternal Grandmother    Colon cancer Paternal Grandfather    Esophageal cancer Neg Hx    Rectal cancer Neg Hx    Stomach cancer Neg Hx    Past Surgical History:  Procedure Laterality Date   ABDOMINAL HYSTERECTOMY     APPENDECTOMY     BACK SURGERY     CHOLECYSTECTOMY     EXPLORATORY LAPAROTOMY     GASTRIC BYPASS     KNEE ARTHROSCOPY     LITHOTRIPSY     REPLACEMENT TOTAL KNEE Left 12/26/2020   Social  History   Social History Narrative   Daily caffeine    Immunization History  Administered Date(s) Administered   PFIZER(Purple Top)SARS-COV-2 Vaccination 07/27/2019, 08/17/2019, 02/02/2020     Objective: Vital Signs: BP 121/78 (BP Location: Left Arm, Patient Position: Sitting, Cuff Size: Normal)   Pulse 80   Ht '5\' 6"'$  (1.676 m)   Wt 227 lb (103 kg)   BMI 36.64 kg/m    Physical Exam Vitals and nursing note reviewed.  Constitutional:      Appearance: She is well-developed.  HENT:     Head: Normocephalic and atraumatic.  Eyes:     Conjunctiva/sclera: Conjunctivae normal.  Cardiovascular:     Rate and Rhythm: Normal rate and regular rhythm.     Heart sounds: Normal heart sounds.  Pulmonary:     Effort: Pulmonary effort is normal.     Breath sounds: Normal breath sounds.  Abdominal:     General: Bowel sounds are normal.     Palpations: Abdomen is soft.  Musculoskeletal:     Cervical back: Normal range of motion.  Lymphadenopathy:     Cervical: No cervical adenopathy.  Skin:    General: Skin is warm and dry.     Capillary Refill: Capillary refill takes less than 2 seconds.  Neurological:     Mental Status: She is alert and oriented to person, place, and time.  Psychiatric:        Behavior: Behavior normal.     Musculoskeletal Exam: C-spine with good range of motion.  Shoulder joints, elbow joints, wrist joints with good range of motion.  She had bilateral PIP and DIP thickening.  Hip joints are in good range of motion.  Right knee joint was in good range of motion without any warmth swelling or effusion.  Left knee joint was replaced and warm and swollen.  There is no tenderness over ankles or MTPs.  CDAI Exam: CDAI Score: -- Patient Global: --; Provider Global: -- Swollen: --; Tender: -- Joint Exam 01/30/2021   No joint exam has been documented for this visit   There is currently no information documented on the homunculus. Go to the Rheumatology activity and  complete the homunculus joint exam.  Investigation: No additional findings.  Imaging: No results found.  Recent Labs: Lab Results  Component Value Date   WBC 8.3 12/05/2020   HGB 13.6 12/05/2020   PLT 313 12/05/2020   NA 137 12/05/2020   K 3.9 12/05/2020   CL 101 12/05/2020   CO2 26 12/05/2020   GLUCOSE 82 12/05/2020   BUN 14 12/05/2020   CREATININE 0.70 12/05/2020   BILITOT 0.4 12/05/2020   ALKPHOS 94 11/09/2018   AST 24 12/05/2020   ALT 23 12/05/2020   PROT 7.6 12/05/2020   ALBUMIN 3.0 (L) 11/09/2018   CALCIUM 9.8 12/05/2020   GFRAA 95 06/26/2020   QFTBGOLDPLUS NEGATIVE 12/05/2020    Speciality Comments: No specialty comments available.  Procedures:  No  procedures performed Allergies: Dilaudid [hydromorphone hcl] and Penicillins   Assessment / Plan:     Visit Diagnoses: Psoriatic arthropathy (HCC)-patient had no synovitis on examination.  She had warmth and swelling in her left knee joint which has recently been replaced.  She has been tolerant tolerating Cosentyx well.  She was off Cosentyx for a month prior to the knee replacement surgery.  She restarted Cosentyx 2 weeks after the surgery.  She did not have a flare.  Psoriasis-she denies having any active psoriasis lesions.  High risk medication use - Cosentyx 300 mg subcutaneous injections every 28 days.  Her labs are due in October and then every 3 months to monitor for drug toxicity.  TB gold was negative in July 2022.  She will need TB Gold on yearly basis.  Information regarding realization was placed in the AVS.  She was advised to stop Cosentyx and she states she develops an infection and restart after the infection is resolves.  Primary osteoarthritis of both hands-joint protection muscle strengthening was discussed.  Primary osteoarthritis of both knees-she has osteoarthritis in her knee joints.  She underwent left total knee replacement.  S/p left total knee replacement-December 26, 2020 by Dr. Maureen Ralphs.  She  is going to physical therapy.  She is recovering gradually.  She still has warmth and swelling on examination.  DDD lumbar spine-core strengthening exercises were discussed.  Increased risk of heart disease with psoriatic arthritis was discussed.  Dietary modifications and exercises were discussed and handout was given.  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  No orders of the defined types were placed in this encounter.   Follow-Up Instructions: Return in about 5 months (around 07/02/2021) for Psoriatic arthritis, Osteoarthritis.   Bo Merino, MD  Note - This record has been created using Editor, commissioning.  Chart creation errors have been sought, but may not always  have been located. Such creation errors do not reflect on  the standard of medical care.

## 2021-01-30 ENCOUNTER — Ambulatory Visit (INDEPENDENT_AMBULATORY_CARE_PROVIDER_SITE_OTHER): Payer: Medicare Other | Admitting: Rheumatology

## 2021-01-30 ENCOUNTER — Other Ambulatory Visit: Payer: Self-pay

## 2021-01-30 ENCOUNTER — Encounter: Payer: Self-pay | Admitting: Rheumatology

## 2021-01-30 VITALS — BP 121/78 | HR 80 | Ht 66.0 in | Wt 227.0 lb

## 2021-01-30 DIAGNOSIS — L4050 Arthropathic psoriasis, unspecified: Secondary | ICD-10-CM

## 2021-01-30 DIAGNOSIS — E559 Vitamin D deficiency, unspecified: Secondary | ICD-10-CM

## 2021-01-30 DIAGNOSIS — M170 Bilateral primary osteoarthritis of knee: Secondary | ICD-10-CM

## 2021-01-30 DIAGNOSIS — Z9884 Bariatric surgery status: Secondary | ICD-10-CM

## 2021-01-30 DIAGNOSIS — L409 Psoriasis, unspecified: Secondary | ICD-10-CM

## 2021-01-30 DIAGNOSIS — M19042 Primary osteoarthritis, left hand: Secondary | ICD-10-CM

## 2021-01-30 DIAGNOSIS — M19041 Primary osteoarthritis, right hand: Secondary | ICD-10-CM | POA: Diagnosis not present

## 2021-01-30 DIAGNOSIS — Z79899 Other long term (current) drug therapy: Secondary | ICD-10-CM | POA: Diagnosis not present

## 2021-01-30 DIAGNOSIS — M47816 Spondylosis without myelopathy or radiculopathy, lumbar region: Secondary | ICD-10-CM

## 2021-01-30 DIAGNOSIS — Z96652 Presence of left artificial knee joint: Secondary | ICD-10-CM

## 2021-01-30 DIAGNOSIS — Z8659 Personal history of other mental and behavioral disorders: Secondary | ICD-10-CM

## 2021-01-30 DIAGNOSIS — Z8719 Personal history of other diseases of the digestive system: Secondary | ICD-10-CM

## 2021-01-30 NOTE — Patient Instructions (Signed)
Standing Labs We placed an order today for your standing lab work.   Please have your standing labs drawn in October and every 3 months  If possible, please have your labs drawn 2 weeks prior to your appointment so that the provider can discuss your results at your appointment.  Please note that you may see your imaging and lab results in [place] before we have reviewed them. We may be awaiting multiple results to interpret others before contacting you. Please allow our office up to 72 hours to thoroughly review all of the results before contacting the office for clarification of your results.  We have open lab daily: Monday through Thursday from 1:30-4:30 PM and Friday from 1:30-4:00 PM at the office of Dr. Bo Merino, [facility].   Please be advised, all patients with office appointments requiring lab work will take precedent over walk-in lab work.  If possible, please come for your lab work on Monday and Friday afternoons, as you may experience shorter wait times. The office is located at [address], [place], [place], [place] [address] No appointment is necessary.   Labs are drawn by [facility]. Please bring your co-pay at the time of your lab draw.  You may receive a bill from [facility] for your lab work.  If you wish to have your labs drawn at another location, please call the office 24 hours in advance to send orders.  If you have any questions regarding directions or hours of operation,  please call [phone].   As a reminder, please drink plenty of water prior to coming for your lab work. Thanks!   Vaccines You are taking a medication(s) that can suppress your immune system.  The following immunizations are recommended: Flu annually Covid-19  Td/Tdap (tetanus, diphtheria, pertussis) every 10 years Pneumonia (Prevnar 15 then Pneumovax 23 at least 1 year apart.  Alternatively, can take Prevnar 20 without needing additional dose) Shingrix: 2 doses from 4 weeks  to 6 months apart  Please check with your PCP to make sure you are up to date.   If you test POSITIVE for COVID19 and have MILD to MODERATE symptoms: First, call your PCP if you would like to receive COVID19 treatment AND Hold your medications during the infection and for at least 1 week after your symptoms have resolved: Injectable medication (Benlysta, Cimzia, Cosentyx, Enbrel, Humira, Orencia, Remicade, Simponi, Stelara, Taltz, Tremfya) Methotrexate Leflunomide (Arava) Azathioprine Mycophenolate (Cellcept) Roma Kayser, or Rinvoq Otezla If you take Actemra or Kevzara, you DO NOT need to hold these for COVID19 infection.  If you test POSITIVE for COVID19 and have NO symptoms: First, call your PCP if you would like to receive COVID19 treatment AND Hold your medications for at least 10 days after the day that you tested positive Injectable medication (Benlysta, Cimzia, Cosentyx, Enbrel, Humira, Orencia, Remicade, Simponi, Stelara, Taltz, Tremfya) Methotrexate Leflunomide (Arava) Azathioprine Mycophenolate (Cellcept) Roma Kayser, or Rinvoq Otezla If you take Actemra or Kevzara, you DO NOT need to hold these for COVID19 infection.  If you have signs or symptoms of an infection or start antibiotics: First, call your PCP for workup of your infection. Hold your medication through the infection, until you complete your antibiotics, and until symptoms resolve if you take the following: Injectable medication (Actemra, Benlysta, Cimzia, Cosentyx, Enbrel, Humira, Kevzara, Orencia, Remicade, Simponi, Stelara, Taltz, Tremfya) Methotrexate Leflunomide (Arava) Mycophenolate (Cellcept) Roma Kayser, or Rinvoq  Heart Disease Prevention   Your inflammatory disease increases your risk of heart disease which  includes heart attack, stroke, atrial fibrillation (irregular heartbeats), high blood pressure, heart failure and atherosclerosis (plaque in the arteries).  It is important to  reduce your risk by:   Keep blood pressure, cholesterol, and blood sugar at healthy levels   Smoking Cessation   Maintain a healthy weight  BMI 20-25   Eat a healthy diet  Plenty of fresh fruit, vegetables, and whole grains  Limit saturated fats, foods high in sodium, and added sugars  DASH and Mediterranean diet   Increase physical activity  Recommend moderate physically activity for 150 minutes per week/ 30 minutes a day for five days a week These can be broken up into three separate ten-minute sessions during the day.   Reduce Stress  Meditation, slow breathing exercises, yoga, coloring books  Dental visits twice a year

## 2021-03-22 ENCOUNTER — Telehealth: Payer: Self-pay | Admitting: Pharmacist

## 2021-03-22 DIAGNOSIS — L4050 Arthropathic psoriasis, unspecified: Secondary | ICD-10-CM

## 2021-03-22 DIAGNOSIS — L409 Psoriasis, unspecified: Secondary | ICD-10-CM

## 2021-03-22 NOTE — Telephone Encounter (Signed)
Received Cosentyx PAP re-enrollment application from [employer]   Will place provider portion in Dr. Arlean Hopping folder to be signed (along with med list, insurance card copy, PA approval letter).   Mailed patient her portion today to be completed and returned to pharmacy team with letter noting that income docs are now required which is change from prior years.   Will need FAXED prescription sent with renewal application as well.     Knox Saliva, PharmD, MPH, BCPS Clinical Pharmacist (Rheumatology and Pulmonology)

## 2021-03-26 NOTE — Telephone Encounter (Signed)
Received signed provider portion of [employer] Cosentyx PAP application. Will place in "PAP pending info" folder in pharmacy office while we wait for patient's portion  Dajon Lazar, PharmD, MPH, BCPS Clinical Pharmacist (Rheumatology and Pulmonology) 

## 2021-04-11 NOTE — Telephone Encounter (Signed)
Patient states she received [employer] PAP application in mail and will plan to return to clinic next week with income documents  Knox Saliva, PharmD, MPH, BCPS Clinical Pharmacist (Rheumatology and Pulmonology)

## 2021-04-17 NOTE — Telephone Encounter (Signed)
Received signed patient form with income documents.  Submitted Patient Assistance RENEWAL Application to [employer] for [employer] along with provider portion, patient portion, insurance card copy, med list, PA, signed rx, income documents. Will update patient when we receive a response.  Fax# [phone] Phone# [phone]

## 2021-04-18 MED ORDER — COSENTYX SENSOREADY (300 MG) 150 MG/ML ~~LOC~~ SOAJ: SUBCUTANEOUS | 0 refills | Status: DC

## 2021-04-26 ENCOUNTER — Other Ambulatory Visit (HOSPITAL_COMMUNITY): Payer: Self-pay

## 2021-04-26 NOTE — Telephone Encounter (Signed)
Received fax stating that patient's enrollment application was missing the patient's signature. Verified that patient's signature is in fact on the patient portion of the application. Reached out to [employer] and spoke with rep, who had to contact a member of leadership in order to view the most recent faxed application. Per rep, the most recent application they had on file was from February of 2022 and the patient signature section was blank. Refaxing application signed on 02/54/8628.

## 2021-05-01 ENCOUNTER — Telehealth: Payer: Self-pay

## 2021-05-01 NOTE — Telephone Encounter (Signed)
Received notification from [facility] regarding a prior authorization for [place]. Authorization has been APPROVED from 05/20/2020 to 05/19/2022.   Authorization # [id number]

## 2021-05-01 NOTE — Telephone Encounter (Signed)
PA renewal initiated automatically by CoverMyMeds.  Submitted a Prior Authorization request to [facility] for [place] via CoverMyMeds. Will update once we receive a response.   Key: [id number]

## 2021-05-07 ENCOUNTER — Telehealth: Payer: Self-pay | Admitting: Rheumatology

## 2021-05-07 DIAGNOSIS — Z79899 Other long term (current) drug therapy: Secondary | ICD-10-CM

## 2021-05-07 NOTE — Telephone Encounter (Signed)
Patient called the office requesting a refill of Cosentyx 300 mg to be sent to [facility] pharmacy.

## 2021-05-07 NOTE — Telephone Encounter (Signed)
Patient advised she is due to update labs. Patient advised we will be unable to refill prescription until her labs have been updated. Patient states she will come tomorrow to update.

## 2021-05-08 ENCOUNTER — Other Ambulatory Visit: Payer: Self-pay | Admitting: *Deleted

## 2021-05-08 DIAGNOSIS — Z79899 Other long term (current) drug therapy: Secondary | ICD-10-CM

## 2021-05-08 LAB — COMPLETE METABOLIC PANEL WITH GFR
AG Ratio: 1.6 (calc) (ref 1.0–2.5)
ALT: 18 U/L (ref 6–29)
AST: 19 U/L (ref 10–35)
Albumin: 4.5 g/dL (ref 3.6–5.1)
Alkaline phosphatase (APISO): 96 U/L (ref 37–153)
BUN: 15 mg/dL (ref 7–25)
CO2: 29 mmol/L (ref 20–32)
Calcium: 9.7 mg/dL (ref 8.6–10.4)
Chloride: 102 mmol/L (ref 98–110)
Creat: 0.68 mg/dL (ref 0.50–1.05)
Globulin: 2.9 g/dL (calc) (ref 1.9–3.7)
Glucose, Bld: 77 mg/dL (ref 65–99)
Potassium: 4.4 mmol/L (ref 3.5–5.3)
Sodium: 140 mmol/L (ref 135–146)
Total Bilirubin: 0.3 mg/dL (ref 0.2–1.2)
Total Protein: 7.4 g/dL (ref 6.1–8.1)
eGFR: 96 mL/min/{1.73_m2} (ref >=60)

## 2021-05-08 LAB — CBC WITH DIFFERENTIAL/PLATELET
Absolute Monocytes: 710 cells/uL (ref 200–950)
Basophils Absolute: 78 cells/uL (ref 0–200)
Basophils Relative: 1.1 %
Eosinophils Absolute: 163 cells/uL (ref 15–500)
Eosinophils Relative: 2.3 %
HCT: 41.5 % (ref 35.0–45.0)
Hemoglobin: 13.5 g/dL (ref 11.7–15.5)
Lymphs Abs: 2478 cells/uL (ref 850–3900)
MCH: 26.7 pg — ABNORMAL LOW (ref 27.0–33.0)
MCHC: 32.5 g/dL (ref 32.0–36.0)
MCV: 82.2 fL (ref 80.0–100.0)
MPV: 11.1 fL (ref 7.5–12.5)
Monocytes Relative: 10 %
Neutro Abs: 3671 cells/uL (ref 1500–7800)
Neutrophils Relative %: 51.7 %
Platelets: 302 10*3/uL (ref 140–400)
RBC: 5.05 10*6/uL (ref 3.80–5.10)
RDW: 14.1 % (ref 11.0–15.0)
Total Lymphocyte: 34.9 %
WBC: 7.1 10*3/uL (ref 3.8–10.8)

## 2021-05-16 ENCOUNTER — Telehealth: Payer: Self-pay | Admitting: Rheumatology

## 2021-05-16 DIAGNOSIS — L409 Psoriasis, unspecified: Secondary | ICD-10-CM

## 2021-05-16 DIAGNOSIS — L4050 Arthropathic psoriasis, unspecified: Secondary | ICD-10-CM

## 2021-05-16 MED ORDER — COSENTYX SENSOREADY (300 MG) 150 MG/ML ~~LOC~~ SOAJ: SUBCUTANEOUS | 0 refills | Status: DC

## 2021-05-16 NOTE — Telephone Encounter (Signed)
Next Visit: 07/03/2021  Last Visit: 01/30/2021  Last Fill: 12/20/2020  DX: Psoriatic arthropathy   Current Dose per office note 01/30/2021: Cosentyx 300 mg subcutaneous injections every 28 days  Labs: 05/08/2021 CBC and CMP WNL  TB Gold: 12/05/2020 Neg   Okay to refill Cosentyx?

## 2021-05-16 NOTE — Telephone Encounter (Signed)
Patient called the office stating she was checking up on a refill request for Cosentyx. Patient stated she was required to update labs before she would be able to refill the medication. Labs done on 05/08/21.

## 2021-05-22 ENCOUNTER — Encounter: Payer: Self-pay | Admitting: Rheumatology

## 2021-05-28 NOTE — Telephone Encounter (Signed)
Received a fax from  [employer] regarding an approval for [place] patient assistance from 05/27/21 to 05/19/22.   Phone number: [phone]  Knox Saliva, PharmD, MPH, BCPS Clinical Pharmacist (Rheumatology and Pulmonology)

## 2021-06-20 NOTE — Progress Notes (Signed)
Office Visit Note  Patient: Cassidy Reeves             Date of Birth: Nov 23, 1955           MRN: [id number]             PCP: Haywood Pao, MD Referring: Haywood Pao, MD Visit Date: 07/03/2021 Occupation: @GUAROCC @  Subjective:  Medication management  History of Present Illness: Cassidy Reeves is a 66 y.o. female with a history of psoriatic arthritis psoriasis and osteoarthritis.  She underwent left total knee replacement in August 2022 by Dr. Maureen Ralphs which is doing well.  She states that she notices some discomfort in her hands and feet just prior to the next Cosentyx injection.  She has not noticed any joint swelling.  She denies any recent episodes of psoriasis flare.  She denies any episodes of Planter fasciitis, Achilles tendinitis or uveitis.  Has been tolerating Cosentyx without any side effects.  Activities of Daily Living:  Patient reports morning stiffness for 1 hour.   Patient Reports nocturnal pain.  Difficulty dressing/grooming: Denies Difficulty climbing stairs: Denies Difficulty getting out of chair: Denies Difficulty using hands for taps, buttons, cutlery, and/or writing: Denies  Review of Systems  Constitutional:  Positive for fatigue.  HENT:  Negative for mouth dryness.   Eyes:  Negative for dryness.  Respiratory:  Negative for shortness of breath.   Cardiovascular:  Negative for swelling in legs/feet.  Gastrointestinal:  Negative for constipation.  Endocrine: Negative for excessive thirst.  Genitourinary:  Negative for difficulty urinating.  Musculoskeletal:  Positive for joint pain, joint pain, morning stiffness and muscle tenderness.  Skin:  Negative for color change, rash and sensitivity to sunlight.  Allergic/Immunologic: Negative for susceptible to infections.  Neurological:  Negative for weakness.  Hematological:  Negative for bruising/bleeding tendency and swollen glands.  Psychiatric/Behavioral:  Positive for sleep disturbance.    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    Past Medical History:  Diagnosis Date   Anxiety    Arthritis    GERD (gastroesophageal reflux disease)    History of kidney stones    Pelvic mass    RA (rheumatoid arthritis) ([place])    UTI (urinary tract infection) 11/09/2018    Family History  Problem Relation Age of Onset   Heart disease Father    Arthritis Father    Psoriasis Father    Parkinsonism Father    Other Father 33       pacemaker   Kidney disease Maternal Uncle    Stroke Maternal Grandmother    Heart failure Paternal Grandmother    Colon cancer Paternal Grandfather    Esophageal cancer Neg Hx    Rectal cancer Neg Hx    Stomach cancer Neg Hx    Past Surgical History:  Procedure Laterality Date   ABDOMINAL HYSTERECTOMY     APPENDECTOMY     BACK SURGERY     CHOLECYSTECTOMY     EXPLORATORY LAPAROTOMY     GASTRIC  BYPASS     KNEE ARTHROSCOPY     LITHOTRIPSY     REPLACEMENT TOTAL KNEE Left 12/26/2020   Social History   Social History Narrative   Daily caffeine    Immunization History  Administered Date(s) Administered   PFIZER(Purple Top)SARS-COV-2 Vaccination 07/27/2019, 08/17/2019, 02/02/2020     Objective: Vital Signs: BP 132/84 (BP Location: Left Arm, Patient Position: Sitting, Cuff Size: Normal)    Pulse 77    Resp 16    Ht 5\' 6"  (1.676 m)    Wt 235 lb (106.6 kg)    BMI 37.93 kg/m    Physical Exam Vitals and nursing note reviewed.  Constitutional:      Appearance: She is well-developed.   HENT:     Head: Normocephalic and atraumatic.  Eyes:     Conjunctiva/sclera: Conjunctivae normal.  Cardiovascular:     Rate and Rhythm: Normal rate and regular rhythm.     Heart sounds: Normal heart sounds.  Pulmonary:     Effort: Pulmonary effort is normal.     Breath sounds: Normal breath sounds.  Abdominal:     General: Bowel sounds are normal.     Palpations: Abdomen is soft.  Musculoskeletal:     Cervical back: Normal range of motion.  Lymphadenopathy:     Cervical: No cervical adenopathy.  Skin:    General: Skin is warm and dry.     Capillary Refill: Capillary refill takes less than 2 seconds.  Neurological:     Mental Status: She is alert and oriented to person, place, and time.  Psychiatric:        Behavior: Behavior normal.     Musculoskeletal Exam: C-spine was in good range of motion.  Shoulder joints, elbow joints, wrist joints with good range of motion.  She had bilateral CMC PIP and DIP thickening with no synovitis.  Hip joints were in good range of motion.  Left knee joint was replaced with some warmth and swelling, no effusion and limited extension.  Right knee joint was in good range of motion without any warmth swelling or effusion.  There was no tenderness over ankles or MTPs.  There was no evidence of Achilles tendinitis of Plan[place] fasciitis.  CDAI Exam: CDAI Score: -- Patient Global: --; Provider Global: -- Swollen: --; Tender: -- Joint Exam 07/03/2021   No joint exam has been documented for this visit   There is currently no information documented on the homunculus. Go to the Rheumatology activity and complete the homunculus joint exam.  Investigation: No additional findings.  Imaging: No results found.  Recent Labs: Lab Results  Component Value Date   WBC 7.1 05/08/2021   HGB 13.5 05/08/2021   PLT 302 05/08/2021   NA 140 05/08/2021   K 4.4 05/08/2021   CL 102 05/08/2021   CO2 29 05/08/2021   GLUCOSE 77 05/08/2021   BUN 15 05/08/2021    CREATININE 0.68 05/08/2021   BILITOT 0.3 05/08/2021   ALKPHOS 94 11/09/2018   AST 19 05/08/2021   ALT 18 05/08/2021   PROT 7.4 05/08/2021   ALBUMIN 3.0 (L) 11/09/2018   CALCIUM 9.7 05/08/2021   GFRAA 95 06/26/2020   QFTBGOLDPLUS NEGATIVE 12/05/2020    Speciality Comments: No specialty comments available.  Procedures:  No procedures performed Allergies: Dilaudid [hydromorphone hcl], Penicillins, Morphine sulfate, Penicillin v potassium, and Sulfamethoxazole-trimethoprim   Assessment / Plan:     Visit Diagnoses: Psoriatic arthropathy (HCC)-she had no synovitis on examination.  She had some swelling in her  left knee which is replaced.  She has been tolerating Cosentyx well without any side effects.  Psoriasis-she had no active lesions from psoriasis.  High risk medication use - Cosentyx 300 mg subcutaneous injections every 28 days.  Labs obtained on May 16, 2021 were reviewed.  CBC with differential and CMP with GFR were normal.  TB gold was negative on December 05, 2020.  She has been advised to get labs in April and then every 3 months to monitor for drug toxicity.  She was advised to hold Cosentyx in case she develops an infection and resume after the infection resolves.  Information on immunization was placed in the AVS.  Primary osteoarthritis of both hands-she has discomfort mostly over her [place] joint.  Use of topical Voltaren gel and CMC brace was advised.  Hand exercises were also discussed.  Primary osteoarthritis of both knees-she continues to have some discomfort in her right knee.  Left knee joint is doing well after the replacement.  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 Maureen Ralphs.  She had warmth and swelling in her right knee joint without any effusion.  She has been trying to do stretching exercises. She had limited extension, warmth and some swelling without effusion  DDD lumbar spine-she denies any discomfort in the lumbar spine today.  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  No orders of the defined types were placed in this encounter.    Follow-Up Instructions: Return in about 5 months (around 11/30/2021) for Psoriatic arthritis.   Bo Merino, MD  Note - This record has been created using Editor, commissioning.  Chart creation errors have been sought, but may not always  have been located. Such creation errors do not reflect on  the standard of medical care.

## 2021-07-03 ENCOUNTER — Ambulatory Visit (INDEPENDENT_AMBULATORY_CARE_PROVIDER_SITE_OTHER): Payer: Medicare Other | Admitting: Rheumatology

## 2021-07-03 ENCOUNTER — Encounter: Payer: Self-pay | Admitting: Rheumatology

## 2021-07-03 ENCOUNTER — Other Ambulatory Visit: Payer: Self-pay

## 2021-07-03 VITALS — BP 132/84 | HR 77 | Resp 16 | Ht 66.0 in | Wt 235.0 lb

## 2021-07-03 DIAGNOSIS — M47816 Spondylosis without myelopathy or radiculopathy, lumbar region: Secondary | ICD-10-CM

## 2021-07-03 DIAGNOSIS — M170 Bilateral primary osteoarthritis of knee: Secondary | ICD-10-CM

## 2021-07-03 DIAGNOSIS — M19042 Primary osteoarthritis, left hand: Secondary | ICD-10-CM

## 2021-07-03 DIAGNOSIS — L409 Psoriasis, unspecified: Secondary | ICD-10-CM | POA: Diagnosis not present

## 2021-07-03 DIAGNOSIS — M19041 Primary osteoarthritis, right hand: Secondary | ICD-10-CM

## 2021-07-03 DIAGNOSIS — L4050 Arthropathic psoriasis, unspecified: Secondary | ICD-10-CM

## 2021-07-03 DIAGNOSIS — E559 Vitamin D deficiency, unspecified: Secondary | ICD-10-CM

## 2021-07-03 DIAGNOSIS — Z79899 Other long term (current) drug therapy: Secondary | ICD-10-CM

## 2021-07-03 DIAGNOSIS — Z96652 Presence of left artificial knee joint: Secondary | ICD-10-CM

## 2021-07-03 DIAGNOSIS — Z9884 Bariatric surgery status: Secondary | ICD-10-CM

## 2021-07-03 DIAGNOSIS — Z8659 Personal history of other mental and behavioral disorders: Secondary | ICD-10-CM

## 2021-07-03 DIAGNOSIS — Z8719 Personal history of other diseases of the digestive system: Secondary | ICD-10-CM

## 2021-07-03 NOTE — Patient Instructions (Signed)
Standing Labs We placed an order today for your standing lab work.   Please have your standing labs drawn in March and every 3 months  If possible, please have your labs drawn 2 weeks prior to your appointment so that the provider can discuss your results at your appointment.  Please note that you may see your imaging and lab results in [facility] before we have reviewed them. We may be awaiting multiple results to interpret others before contacting you. Please allow our office up to 72 hours to thoroughly review all of the results before contacting the office for clarification of your results.  We have open lab daily: Monday through Thursday from 1:30-4:30 PM and Friday from 1:30-4:00 PM at the office of Dr. Bo Merino, [facility].   Please be advised, all patients with office appointments requiring lab work will take precedent over walk-in lab work.  If possible, please come for your lab work on Monday and Friday afternoons, as you may experience shorter wait times. The office is located at [address] [place], [place], [place] [address] No appointment is necessary.   Labs are drawn by [facility]. Please bring your co-pay at the time of your lab draw.  You may receive a bill from [facility] for your lab work.  Please note if you are on Hydroxychloroquine and and an order has been placed for a Hydroxychloroquine level, you will need to have it drawn 4 hours or more after your last dose.  If you wish to have your labs drawn at another location, please call the office 24 hours in advance to send orders.  If you have any questions regarding directions or hours of operation,  please call [phone].   As a reminder, please drink plenty of water prior to coming for your lab work. Thanks!   Vaccines You are taking a medication(s) that can suppress your immune system.  The following immunizations are recommended: Flu annually Covid-19  Td/Tdap (tetanus, diphtheria, pertussis)  every 10 years Pneumonia (Prevnar 15 then Pneumovax 23 at least 1 year apart.  Alternatively, can take Prevnar 20 without needing additional dose) Shingrix: 2 doses from 4 weeks to 6 months apart  Please check with your PCP to make sure you are up to date.   If you have signs or symptoms of an infection or start antibiotics: First, call your PCP for workup of your infection. Hold your medication through the infection, until you complete your antibiotics, and until symptoms resolve if you take the following: Injectable medication (Actemra, Benlysta, Cimzia, Cosentyx, Enbrel, Humira, Kevzara, Orencia, Remicade, Simponi, Stelara, Taltz, Tremfya) Methotrexate Leflunomide (Arava) Mycophenolate (Cellcept) Morrie Sheldon, Olumiant, or Rinvoq

## 2021-08-09 ENCOUNTER — Other Ambulatory Visit: Payer: Self-pay | Admitting: *Deleted

## 2021-08-09 DIAGNOSIS — L409 Psoriasis, unspecified: Secondary | ICD-10-CM

## 2021-08-09 DIAGNOSIS — L4050 Arthropathic psoriasis, unspecified: Secondary | ICD-10-CM

## 2021-08-09 MED ORDER — COSENTYX SENSOREADY (300 MG) 150 MG/ML ~~LOC~~ SOAJ: SUBCUTANEOUS | 0 refills | Status: DC

## 2021-08-09 NOTE — Telephone Encounter (Signed)
Refill request received via fax ? ?Next Visit: 12/03/2021 ? ?Last Visit: 07/03/2021 ? ?Last Fill: 05/16/2021 ? ?DX: Psoriatic arthropathy  ? ?Current Dose per office note 07/03/2021: Cosentyx 300 mg subcutaneous injections every 28 days ? ?Labs: 05/08/2021 CBC and CMP WNL ? ?TB Gold: 12/05/2020 Neg   ? ?Left message to advise patient she is due to update labs.  ? ?Okay to refill Cosentyx?  ?

## 2021-09-03 ENCOUNTER — Other Ambulatory Visit: Payer: Self-pay

## 2021-09-03 DIAGNOSIS — Z79899 Other long term (current) drug therapy: Secondary | ICD-10-CM

## 2021-09-04 LAB — CBC WITH DIFFERENTIAL/PLATELET
Absolute Monocytes: 540 cells/uL (ref 200–950)
Basophils Absolute: 72 cells/uL (ref 0–200)
Basophils Relative: 1.1 %
Eosinophils Absolute: 150 cells/uL (ref 15–500)
Eosinophils Relative: 2.3 %
HCT: 41.7 % (ref 35.0–45.0)
Hemoglobin: 13.9 g/dL (ref 11.7–15.5)
Lymphs Abs: 1937 cells/uL (ref 850–3900)
MCH: 28 pg (ref 27.0–33.0)
MCHC: 33.3 g/dL (ref 32.0–36.0)
MCV: 84.1 fL (ref 80.0–100.0)
MPV: 11.1 fL (ref 7.5–12.5)
Monocytes Relative: 8.3 %
Neutro Abs: 3803 cells/uL (ref 1500–7800)
Neutrophils Relative %: 58.5 %
Platelets: 296 10*3/uL (ref 140–400)
RBC: 4.96 10*6/uL (ref 3.80–5.10)
RDW: 12.9 % (ref 11.0–15.0)
Total Lymphocyte: 29.8 %
WBC: 6.5 10*3/uL (ref 3.8–10.8)

## 2021-09-04 LAB — COMPLETE METABOLIC PANEL WITH GFR
AG Ratio: 1.4 (calc) (ref 1.0–2.5)
ALT: 19 U/L (ref 6–29)
AST: 18 U/L (ref 10–35)
Albumin: 4.3 g/dL (ref 3.6–5.1)
Alkaline phosphatase (APISO): 92 U/L (ref 37–153)
BUN: 17 mg/dL (ref 7–25)
CO2: 26 mmol/L (ref 20–32)
Calcium: 9.7 mg/dL (ref 8.6–10.4)
Chloride: 104 mmol/L (ref 98–110)
Creat: 0.78 mg/dL (ref 0.50–1.05)
Globulin: 3.1 g/dL (calc) (ref 1.9–3.7)
Glucose, Bld: 131 mg/dL — ABNORMAL HIGH (ref 65–99)
Potassium: 4.4 mmol/L (ref 3.5–5.3)
Sodium: 139 mmol/L (ref 135–146)
Total Bilirubin: 0.3 mg/dL (ref 0.2–1.2)
Total Protein: 7.4 g/dL (ref 6.1–8.1)
eGFR: 84 mL/min/{1.73_m2} (ref >=60)

## 2021-09-04 NOTE — Progress Notes (Signed)
CBC and CMP are normal.  Glucose is mildly elevated, probably not a fasting sample.

## 2021-11-08 ENCOUNTER — Other Ambulatory Visit: Payer: Self-pay | Admitting: *Deleted

## 2021-11-08 DIAGNOSIS — L4050 Arthropathic psoriasis, unspecified: Secondary | ICD-10-CM

## 2021-11-08 DIAGNOSIS — L409 Psoriasis, unspecified: Secondary | ICD-10-CM

## 2021-11-08 MED ORDER — COSENTYX SENSOREADY (300 MG) 150 MG/ML ~~LOC~~ SOAJ: SUBCUTANEOUS | 0 refills | Status: DC

## 2021-11-08 NOTE — Telephone Encounter (Signed)
Next Visit: 12/03/2021  Last Visit: 07/03/2021  Last Fill: 08/09/2021  DX: Psoriatic arthropathy   Current Dose per office note 07/03/2021: Cosentyx 300 mg subcutaneous injections every 28 days  Labs: 09/03/2021, CBC and CMP are normal.  Glucose is mildly elevated, probably not a fasting sample  TB Gold: 12/05/2021,  TB gold negative.   Okay to refill Cosentyx?

## 2021-11-21 NOTE — Progress Notes (Unsigned)
Office Visit Note  Patient: Cassidy Reeves             Date of Birth: Nov 22, 1955           MRN: [id number]             PCP: Haywood Pao, MD Referring: Haywood Pao, MD Visit Date: 12/03/2021 Occupation: '@GUAROCC'$ @  Subjective:  Medication monitoring   History of Present Illness: Cassidy Reeves is a 66 y.o. female with history of psoriatic arthritis and osteoarthritis.  She is remains on cosentyx 300 mg sq injections every 28 days. She continues to tolerate cosentyx without any side effects or injection site reactions.  She has not missed any doses of Cosentyx recently.  She has not had any recent or recurrent infections.  She denies any signs or symptoms of any sort of arthritis flare.  She states she experiences some increased arthralgias and fatigue leading up to her monthly Cosentyx injections.  She denies any joint swelling.  She has not had any Achilles tendinitis or planter fasciitis.  She experiences some stiffness in her feet intermittently.  She denies any SI joint discomfort.  She denies any active psoriasis at this time.  She states that overall her left knee replacement is doing well.    Activities of Daily Living:  Patient reports morning stiffness for 1-1.5 hours.   Patient Reports nocturnal pain.  Difficulty dressing/grooming: Denies Difficulty climbing stairs: Reports Difficulty getting out of chair: Denies Difficulty using hands for taps, buttons, cutlery, and/or writing: Reports  Review of Systems  Constitutional:  Positive for fatigue.  HENT:  Negative for mouth sores and mouth dryness.   Eyes:  Negative for dryness.  Respiratory:  Negative for shortness of breath.   Cardiovascular:  Negative for chest pain and palpitations.  Gastrointestinal:  Negative for blood in stool, constipation and diarrhea.  Endocrine: Negative for increased urination.  Genitourinary:  Negative for involuntary urination.  Musculoskeletal:  Positive for joint pain, joint pain and  morning stiffness. Negative for joint swelling, myalgias, muscle weakness, muscle tenderness and myalgias.  Skin:  Negative for color change, rash, hair loss and sensitivity to sunlight.  Allergic/Immunologic: Negative for susceptible to infections.  Neurological:  Negative for dizziness and headaches.  Hematological:  Negative for swollen glands.  Psychiatric/Behavioral:  Positive for sleep disturbance. Negative for depressed mood. The patient is not nervous/anxious.     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    Past Medical History:  Diagnosis Date   Anxiety    Arthritis    GERD (gastroesophageal reflux disease)    History of kidney stones    Pelvic mass    RA (rheumatoid arthritis) ([place])    UTI (urinary tract infection) 11/09/2018    Family History  Problem Relation Age of Onset   Heart disease Father    Arthritis Father    Psoriasis Father    Parkinsonism Father    Other Father 34       pacemaker   Kidney  disease Maternal Uncle    Stroke Maternal Grandmother    Heart failure Paternal Grandmother    Colon cancer Paternal Grandfather    Esophageal cancer Neg Hx    Rectal cancer Neg Hx    Stomach cancer Neg Hx    Past Surgical History:  Procedure Laterality Date   ABDOMINAL HYSTERECTOMY     APPENDECTOMY     BACK SURGERY     CHOLECYSTECTOMY     EXPLORATORY LAPAROTOMY     GASTRIC BYPASS     KNEE ARTHROSCOPY     LITHOTRIPSY     REPLACEMENT TOTAL KNEE Left 12/26/2020   Social History    Social History Narrative   Daily caffeine    Immunization History  Administered Date(s) Administered   PFIZER(Purple Top)SARS-COV-2 Vaccination 07/27/2019, 08/17/2019, 02/02/2020     Objective: Vital Signs: BP 135/76 (BP Location: Left Arm, Patient Position: Sitting, Cuff Size: Large)   Pulse 69   Ht '5\' 5"'$  (1.651 m)   Wt 239 lb 6.4 oz (108.6 kg)   BMI 39.84 kg/m    Physical Exam Vitals and nursing note reviewed.  Constitutional:      Appearance: She is well-developed.  HENT:     Head: Normocephalic and atraumatic.  Eyes:     Conjunctiva/sclera: Conjunctivae normal.  Cardiovascular:     Rate and Rhythm: Normal rate and regular rhythm.     Heart sounds: Normal heart sounds.  Pulmonary:     Effort: Pulmonary effort is normal.     Breath sounds: Normal breath sounds.  Abdominal:     General: Bowel sounds are normal.     Palpations: Abdomen is soft.  Musculoskeletal:     Cervical back: Normal range of motion.  Skin:    General: Skin is warm and dry.     Capillary Refill: Capillary refill takes less than 2 seconds.  Neurological:     Mental Status: She is alert and oriented to person, place, and time.  Psychiatric:        Behavior: Behavior normal.      Musculoskeletal Exam: C-spine, thoracic spine, lumbar spine have good range of motion.  No midline spinal tenderness.  No SI joint tenderness upon palpation.  Shoulder joints, elbow joints, wrist joints, MCPs, PIPs, DIPs have good range of motion with no synovitis.  She has PIP and DIP thickening consistent with osteoarthritis of both hands.  Complete fist formation bilaterally.  Hip joints have good range of motion with no groin pain.  Tenderness palpation over bilateral trochanteric bursa.  Left knee replacement has warmth but no effusion.  Right knee joint has good range of motion with no warmth or effusion.  Ankle joints have good range of motion with no tenderness or joint swelling.  No evidence of Achilles tendinitis  or planter fasciitis.  No MTP joint tenderness noted.  CDAI Exam: CDAI Score: -- Patient Global: --; Provider Global: -- Swollen: --; Tender: -- Joint Exam 12/03/2021   No joint exam has been documented for this visit   There is currently no information documented on the homunculus. Go to the Rheumatology activity and complete the homunculus joint exam.  Investigation: No additional findings.  Imaging: No results found.  Recent Labs: Lab Results  Component Value Date   WBC 6.5 09/03/2021   HGB 13.9 09/03/2021   PLT 296 09/03/2021   NA 139 09/03/2021   K 4.4 09/03/2021   CL 104 09/03/2021   CO2 26 09/03/2021   GLUCOSE 131 (H) 09/03/2021  BUN 17 09/03/2021   CREATININE 0.78 09/03/2021   BILITOT 0.3 09/03/2021   ALKPHOS 94 11/09/2018   AST 18 09/03/2021   ALT 19 09/03/2021   PROT 7.4 09/03/2021   ALBUMIN 3.0 (L) 11/09/2018   CALCIUM 9.7 09/03/2021   GFRAA 95 06/26/2020   QFTBGOLDPLUS NEGATIVE 12/05/2020    Speciality Comments: No specialty comments available.  Procedures:  No procedures performed Allergies: Dilaudid [hydromorphone hcl], Penicillins, Morphine sulfate, Penicillin v potassium, and Sulfamethoxazole-trimethoprim    Assessment / Plan:     Visit Diagnoses: Psoriatic arthropathy ([place]): She has no synovitis or dactylitis on examination today.  She has not had any signs or symptoms of a psoriatic arthritis flare.  She has clinically been doing well on Cosentyx 300 mg subcutaneous injections every 28 days.  She continues to tolerate Cosentyx without any side effects or injection site reactions.  She has not had any recent or recurrent infections. She has no evidence of Achilles tendinitis or plantar fasciitis.  No SI joint tenderness upon palpation.  No evidence of uveitis.  She has not had any active psoriasis.  She experiences increased fatigue and occasional arthralgias leading up to her monthly Cosentyx injections but overall her psoriatic arthritis remains  well controlled on the current treatment regimen.  She will remain on Cosentyx as prescribed.  She was advised to notify us if she develops increased joint pain or joint swelling.  She will follow-up in the office in 5 months or sooner if needed.  Association of heart disease with psoriatic arthritis was discussed. Need to monitor blood pressure, cholesterol, and to exercise 30-60 minutes on daily basis was discussed.   Psoriasis: No active psoriasis at this time.   High risk medication use - Cosentyx 300 mg subcutaneous injections every 28 days.  - Plan: CBC with Differential/Platelet, COMPLETE METABOLIC PANEL WITH GFR, QuantiFERON-TB Gold Plus CBC and CMP drawn on 09/03/2021.  Orders for CBC and CMP were released today.  Her next lab work will be due in October and every 3 months to monitor for drug toxicity.  Standing orders for CBC and CMP remain in place. TB Gold negative on 12/05/2020.  Orders for TB Gold released today. Discussed the importance of holding Cosentyx if she develops signs or symptoms of infection and to resume once the infection has completely cleared.  Screening for tuberculosis -Order for TB gold released today. plan: QuantiFERON-TB Gold Plus  Primary osteoarthritis of both hands: She has PIP and DIP thickening consistent with osteoarthritis of both hands.  She has some tenderness over the left CMC joint.  No inflammation was noted.  Complete fist formation bilaterally.  Discussed the importance of joint protection and muscle strengthening.  Primary osteoarthritis of right knee: She has good ROM with no discomfort.  No warmth or effusion of the right knee noted.   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 Maureen Ralphs. Doing well.  She has good ROM with no discomfort.  Warmth but no effusion noted.   DDD lumbar spine: No midline spinal tenderness.  No symptoms of radiculopathy.   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    Follow-Up Instructions: Return in about 5 months (around 05/05/2022) for Psoriatic arthritis, Osteoarthritis.   Ofilia Neas, PA-C  Note - This record has been created using Dragon software.  Chart creation errors have been sought, but may not always  have been located. Such creation errors do not reflect on  the standard of medical care.

## 2021-12-03 ENCOUNTER — Encounter: Payer: Self-pay | Admitting: Physician Assistant

## 2021-12-03 ENCOUNTER — Ambulatory Visit (INDEPENDENT_AMBULATORY_CARE_PROVIDER_SITE_OTHER): Payer: Medicare Other | Admitting: Physician Assistant

## 2021-12-03 VITALS — BP 135/76 | HR 69 | Ht 65.0 in | Wt 239.4 lb

## 2021-12-03 DIAGNOSIS — E559 Vitamin D deficiency, unspecified: Secondary | ICD-10-CM

## 2021-12-03 DIAGNOSIS — Z9884 Bariatric surgery status: Secondary | ICD-10-CM

## 2021-12-03 DIAGNOSIS — Z8719 Personal history of other diseases of the digestive system: Secondary | ICD-10-CM

## 2021-12-03 DIAGNOSIS — M47816 Spondylosis without myelopathy or radiculopathy, lumbar region: Secondary | ICD-10-CM

## 2021-12-03 DIAGNOSIS — Z79899 Other long term (current) drug therapy: Secondary | ICD-10-CM

## 2021-12-03 DIAGNOSIS — M170 Bilateral primary osteoarthritis of knee: Secondary | ICD-10-CM

## 2021-12-03 DIAGNOSIS — Z96652 Presence of left artificial knee joint: Secondary | ICD-10-CM

## 2021-12-03 DIAGNOSIS — Z8659 Personal history of other mental and behavioral disorders: Secondary | ICD-10-CM

## 2021-12-03 DIAGNOSIS — L409 Psoriasis, unspecified: Secondary | ICD-10-CM

## 2021-12-03 DIAGNOSIS — M19041 Primary osteoarthritis, right hand: Secondary | ICD-10-CM

## 2021-12-03 DIAGNOSIS — Z111 Encounter for screening for respiratory tuberculosis: Secondary | ICD-10-CM

## 2021-12-03 DIAGNOSIS — L4050 Arthropathic psoriasis, unspecified: Secondary | ICD-10-CM

## 2021-12-03 DIAGNOSIS — M1711 Unilateral primary osteoarthritis, right knee: Secondary | ICD-10-CM

## 2021-12-03 DIAGNOSIS — M19042 Primary osteoarthritis, left hand: Secondary | ICD-10-CM

## 2021-12-03 NOTE — Patient Instructions (Addendum)
Standing Labs We placed an order today for your standing lab work.   Please have your standing labs drawn in October and every 3 months   If possible, please have your labs drawn 2 weeks prior to your appointment so that the provider can discuss your results at your appointment.  Please note that you may see your imaging and lab results in MyChart before we have reviewed them. We may be awaiting multiple results to interpret others before contacting you. Please allow our office up to 72 hours to thoroughly review all of the results before contacting the office for clarification of your results.  We have open lab daily: Monday through Thursday from 1:30-4:30 PM and Friday from 1:30-4:00 PM at the office of Dr. Shaili Deveshwar, [facility].   Please be advised, all patients with office appointments requiring lab work will take precedent over walk-in lab work.  If possible, please come for your lab work on Monday and Friday afternoons, as you may experience shorter wait times. The office is located at [address], [address], [place], [place] [address] No appointment is necessary.   Labs are drawn by [facility]. Please bring your co-pay at the time of your lab draw.  You may receive a bill from [facility] for your lab work.  Please note if you are on Hydroxychloroquine and and an order has been placed for a Hydroxychloroquine level, you will need to have it drawn 4 hours or more after your last dose.  If you wish to have your labs drawn at another location, please call the office 24 hours in advance to send orders.  If you have any questions regarding directions or hours of operation,  please call [phone].   As a reminder, please drink plenty of water prior to coming for your lab work. Thanks!  If you have signs or symptoms of an infection or start antibiotics: First, call your PCP for workup of your infection. Hold your medication through the infection, until you complete your  antibiotics, and until symptoms resolve if you take the following: Injectable medication (Actemra, Benlysta, Cimzia, Cosentyx, Enbrel, Humira, Kevzara, Orencia, Remicade, Simponi, Stelara, Taltz, Tremfya) Methotrexate Leflunomide (Arava) Mycophenolate (Cellcept) Xeljanz, Olumiant, or Rinvoq   

## 2021-12-04 NOTE — Progress Notes (Signed)
CBC and CMP WNL

## 2021-12-05 LAB — COMPLETE METABOLIC PANEL WITH GFR
AG Ratio: 1.5 (calc) (ref 1.0–2.5)
ALT: 21 U/L (ref 6–29)
AST: 21 U/L (ref 10–35)
Albumin: 4.5 g/dL (ref 3.6–5.1)
Alkaline phosphatase (APISO): 87 U/L (ref 37–153)
BUN: 13 mg/dL (ref 7–25)
CO2: 29 mmol/L (ref 20–32)
Calcium: 9.8 mg/dL (ref 8.6–10.4)
Chloride: 104 mmol/L (ref 98–110)
Creat: 0.67 mg/dL (ref 0.50–1.05)
Globulin: 3 g/dL (calc) (ref 1.9–3.7)
Glucose, Bld: 85 mg/dL (ref 65–99)
Potassium: 4.5 mmol/L (ref 3.5–5.3)
Sodium: 142 mmol/L (ref 135–146)
Total Bilirubin: 0.3 mg/dL (ref 0.2–1.2)
Total Protein: 7.5 g/dL (ref 6.1–8.1)
eGFR: 96 mL/min/{1.73_m2} (ref >=60)

## 2021-12-05 LAB — CBC WITH DIFFERENTIAL/PLATELET
Absolute Monocytes: 555 cells/uL (ref 200–950)
Basophils Absolute: 71 cells/uL (ref 0–200)
Basophils Relative: 1.2 %
Eosinophils Absolute: 159 cells/uL (ref 15–500)
Eosinophils Relative: 2.7 %
HCT: 39.9 % (ref 35.0–45.0)
Hemoglobin: 13.5 g/dL (ref 11.7–15.5)
Lymphs Abs: 2077 cells/uL (ref 850–3900)
MCH: 28.5 pg (ref 27.0–33.0)
MCHC: 33.8 g/dL (ref 32.0–36.0)
MCV: 84.4 fL (ref 80.0–100.0)
MPV: 10.7 fL (ref 7.5–12.5)
Monocytes Relative: 9.4 %
Neutro Abs: 3039 cells/uL (ref 1500–7800)
Neutrophils Relative %: 51.5 %
Platelets: 319 10*3/uL (ref 140–400)
RBC: 4.73 10*6/uL (ref 3.80–5.10)
RDW: 13 % (ref 11.0–15.0)
Total Lymphocyte: 35.2 %
WBC: 5.9 10*3/uL (ref 3.8–10.8)

## 2021-12-05 LAB — QUANTIFERON-TB GOLD PLUS
Mitogen-NIL: >10 IU/mL
NIL: 0.11 IU/mL
QuantiFERON-TB Gold Plus: NEGATIVE
TB1-NIL: <0 IU/mL
TB2-NIL: <0 IU/mL

## 2021-12-06 NOTE — Progress Notes (Signed)
TB gold negative

## 2022-03-01 ENCOUNTER — Other Ambulatory Visit: Payer: Self-pay | Admitting: *Deleted

## 2022-03-01 DIAGNOSIS — L4050 Arthropathic psoriasis, unspecified: Secondary | ICD-10-CM

## 2022-03-01 DIAGNOSIS — L409 Psoriasis, unspecified: Secondary | ICD-10-CM

## 2022-03-01 MED ORDER — COSENTYX SENSOREADY (300 MG) 150 MG/ML ~~LOC~~ SOAJ: SUBCUTANEOUS | 0 refills | Status: DC

## 2022-03-01 NOTE — Telephone Encounter (Signed)
Refill request received via fax from [facility] by [employer] for Cosentyx.  Next Visit: 05/09/2022  Last Visit: 12/03/2021  Last Fill: 11/08/2021  DX: Psoriatic arthropathy   Current Dose per office note 12/03/2021:  Cosentyx 300 mg subcutaneous injections every 28 days  Labs: 12/03/2021 CBC and CMP WNL  TB Gold: 12/03/2021 Neg    Patient advised she is due to update labs. Patient states she will update in the next couple of weeks.   Okay to refill Cosentyx?

## 2022-03-19 ENCOUNTER — Other Ambulatory Visit: Payer: Self-pay

## 2022-03-19 DIAGNOSIS — Z79899 Other long term (current) drug therapy: Secondary | ICD-10-CM

## 2022-03-20 ENCOUNTER — Other Ambulatory Visit: Payer: Self-pay | Admitting: *Deleted

## 2022-03-20 ENCOUNTER — Telehealth: Payer: Self-pay | Admitting: Pharmacist

## 2022-03-20 DIAGNOSIS — L4050 Arthropathic psoriasis, unspecified: Secondary | ICD-10-CM

## 2022-03-20 DIAGNOSIS — L409 Psoriasis, unspecified: Secondary | ICD-10-CM

## 2022-03-20 LAB — COMPLETE METABOLIC PANEL WITH GFR
AG Ratio: 1.4 (calc) (ref 1.0–2.5)
ALT: 19 U/L (ref 6–29)
AST: 13 U/L (ref 10–35)
Albumin: 4.3 g/dL (ref 3.6–5.1)
Alkaline phosphatase (APISO): 101 U/L (ref 37–153)
BUN: 15 mg/dL (ref 7–25)
CO2: 29 mmol/L (ref 20–32)
Calcium: 9.6 mg/dL (ref 8.6–10.4)
Chloride: 105 mmol/L (ref 98–110)
Creat: 0.74 mg/dL (ref 0.50–1.05)
Globulin: 3.1 g/dL (calc) (ref 1.9–3.7)
Glucose, Bld: 92 mg/dL (ref 65–99)
Potassium: 4.6 mmol/L (ref 3.5–5.3)
Sodium: 142 mmol/L (ref 135–146)
Total Bilirubin: 0.3 mg/dL (ref 0.2–1.2)
Total Protein: 7.4 g/dL (ref 6.1–8.1)
eGFR: 89 mL/min/{1.73_m2} (ref >=60)

## 2022-03-20 LAB — CBC WITH DIFFERENTIAL/PLATELET
Absolute Monocytes: 650 cells/uL (ref 200–950)
Basophils Absolute: 98 cells/uL (ref 0–200)
Basophils Relative: 1.5 %
Eosinophils Absolute: 241 cells/uL (ref 15–500)
Eosinophils Relative: 3.7 %
HCT: 40.6 % (ref 35.0–45.0)
Hemoglobin: 13.8 g/dL (ref 11.7–15.5)
Lymphs Abs: 2243 cells/uL (ref 850–3900)
MCH: 28.1 pg (ref 27.0–33.0)
MCHC: 34 g/dL (ref 32.0–36.0)
MCV: 82.7 fL (ref 80.0–100.0)
MPV: 11.1 fL (ref 7.5–12.5)
Monocytes Relative: 10 %
Neutro Abs: 3270 cells/uL (ref 1500–7800)
Neutrophils Relative %: 50.3 %
Platelets: 314 10*3/uL (ref 140–400)
RBC: 4.91 10*6/uL (ref 3.80–5.10)
RDW: 12.8 % (ref 11.0–15.0)
Total Lymphocyte: 34.5 %
WBC: 6.5 10*3/uL (ref 3.8–10.8)

## 2022-03-20 MED ORDER — COSENTYX SENSOREADY (300 MG) 150 MG/ML ~~LOC~~ SOAJ: SUBCUTANEOUS | 0 refills | Status: DC

## 2022-03-20 NOTE — Progress Notes (Signed)
CBC and CMP normal

## 2022-03-20 NOTE — Telephone Encounter (Signed)
Patient left a message to request a refill on her Cosentyx to be sent to the patient assistance.  Next Visit: 05/09/2022  Last Visit: 12/03/2021  Last Fill: 03/19/2022 (03/01/2022) 30 day supply  WN:[id number] arthropathy   Current Dose per office note 12/03/2021: Cosentyx 300 mg subcutaneous injections every 28 days  Labs: 03/19/2022 CBC and CMP normal.  TB Gold: 12/03/2021 Neg    Okay to refill Cosentyx?

## 2022-04-16 NOTE — Telephone Encounter (Signed)
Confirmed with patient that she received COSENTYX renewal patient portion and she will be completing it and returning it to clinic.   [place] [facility] [employer] PharmD Candidate [phone]

## 2022-04-16 NOTE — Telephone Encounter (Signed)
Please call patient. Patient has questions regarding application.

## 2022-04-17 ENCOUNTER — Other Ambulatory Visit: Payer: Self-pay | Admitting: *Deleted

## 2022-04-17 DIAGNOSIS — L4050 Arthropathic psoriasis, unspecified: Secondary | ICD-10-CM

## 2022-04-17 DIAGNOSIS — L409 Psoriasis, unspecified: Secondary | ICD-10-CM

## 2022-04-17 MED ORDER — COSENTYX SENSOREADY (300 MG) 150 MG/ML ~~LOC~~ SOAJ: SUBCUTANEOUS | 2 refills | Status: DC

## 2022-04-17 NOTE — Telephone Encounter (Signed)
Next Visit: 05/09/2022  Last Visit: 12/03/2021  Last Fill: 03/20/2022 (30 day supply)  DX: Psoriatic arthropathy   Current Dose per office note 12/03/2021: Cosentyx 300 mg subcutaneous injections every 28 days   Labs: 03/19/2022 CBC and CMP normal.   TB Gold: 12/03/2021 Neg     Okay to refill Cosentyx?

## 2022-04-17 NOTE — Telephone Encounter (Signed)
Patient left another message stating she was trying to reach [place] in regards to her application.

## 2022-04-18 NOTE — Telephone Encounter (Signed)
Returned call to patient regarding application. She states she will drop off her portion of application with income documents so we can complete provider portion.  She also inquired about Cosentyx refill. Advised that refill was sent to pharmacy shortly before 4:30pm yesterday. She will reach back out to [employer] to schedule refill  Knox Saliva, PharmD, MPH, BCPS, CPP Clinical Pharmacist (Rheumatology and Pulmonology)

## 2022-04-23 NOTE — Telephone Encounter (Addendum)
Received patient portion of [employer] PAP application with income documents. Submission pending provider signature. Placed in Dr. Arlean Hopping folder today.  Submitted a Prior Authorization RENEWAL request to [facility] for [place] via CoverMyMeds. Will update once we receive a response.  Key: [id number]  Knox Saliva, PharmD, MPH, BCPS, CPP Clinical Pharmacist (Rheumatology and Pulmonology)

## 2022-04-23 NOTE — Telephone Encounter (Signed)
Received notification from [facility] regarding a prior authorization for [place]. Authorization has been APPROVED from 04/23/2022 to 05/20/2023. Approval letter sent to scan center.  Authorization # [id number] Phone # [phone]  Knox Saliva, PharmD, MPH, BCPS, CPP Clinical Pharmacist (Rheumatology and Pulmonology)

## 2022-04-24 NOTE — Telephone Encounter (Signed)
Submitted Patient Assistance RENEWAL Application to [employer] for COSENTYX along with provider portion, patient portion, med list, insurance card copy, PA and income documents. Will update patient when we receive a response.  Fax# [phone] Phone# [phone]  Dashanna Kinnamon, PharmD, MPH, BCPS, CPP Clinical Pharmacist (Rheumatology and Pulmonology) 

## 2022-04-26 NOTE — Progress Notes (Signed)
Office Visit Note  Patient: Cassidy Reeves             Date of Birth: 1956-04-07           MRN: [id number]             PCP: Haywood Pao, MD Referring: Haywood Pao, MD Visit Date: 05/09/2022 Occupation: '@GUAROCC'$ @  Subjective:  Left index trigger finger  History of Present Illness: Sin[place] Knights is a 66 y.o. female she of psoriatic arthritis, psoriasis and osteoarthritis.  She has been having intermittent triggering of her left index finger which has become more prominent now.  She had a cortisone injection several years ago.  Not noticed any joint swelling.  Her left knee joint which is replaced has been causing some discomfort.  She denies any history of Planter fasciitis, Achilles tendinitis or uveitis.  Patient denies any interruption in Cosentyx.  She has been taking injections on a monthly basis.  Activities of Daily Living:  Patient reports morning stiffness for 1-1.5 hours.   Patient Denies nocturnal pain.  Difficulty dressing/grooming: Denies Difficulty climbing stairs: Reports Difficulty getting out of chair: Reports Difficulty using hands for taps, buttons, cutlery, and/or writing: Reports  Review of Systems  Constitutional:  Positive for fatigue.  HENT:  Negative for mouth sores and mouth dryness.   Eyes:  Negative for dryness.  Respiratory:  Negative for shortness of breath.   Cardiovascular:  Negative for chest pain and palpitations.  Gastrointestinal:  Negative for blood in stool, constipation and diarrhea.  Endocrine: Negative for increased urination.  Genitourinary:  Negative for involuntary urination.  Musculoskeletal:  Positive for joint pain, joint pain, myalgias, muscle weakness, morning stiffness, muscle tenderness and myalgias. Negative for gait problem and joint swelling.  Skin:  Negative for color change, rash, hair loss and sensitivity to sunlight.  Allergic/Immunologic: Negative for susceptible to infections.  Neurological:  Negative for  dizziness and headaches.  Hematological:  Negative for swollen glands.  Psychiatric/Behavioral:  Positive for depressed mood. Negative for sleep disturbance. The patient is nervous/anxious.     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    Past Medical History:  Diagnosis Date   Anxiety    Arthritis    GERD (gastroesophageal reflux disease)    History of kidney stones    Pelvic mass    RA (rheumatoid arthritis) ([place])    UTI (urinary tract infection) 11/09/2018    Family History  Problem Relation Age of Onset   Heart disease Father    Arthritis Father    Psoriasis Father    Parkinsonism Father    Other Father 67       pacemaker   Kidney disease Maternal Uncle    Stroke Maternal Grandmother    Heart failure Paternal Grandmother    Colon cancer Paternal Grandfather    Esophageal cancer Neg Hx    Rectal cancer Neg Hx    Stomach cancer Neg Hx    Past Surgical History:  Procedure Laterality Date  ABDOMINAL HYSTERECTOMY     APPENDECTOMY     BACK SURGERY     CHOLECYSTECTOMY     EXPLORATORY LAPAROTOMY     GASTRIC BYPASS     KNEE ARTHROSCOPY     LITHOTRIPSY     REPLACEMENT TOTAL KNEE Left 12/26/2020   Social History   Social History Narrative   Daily caffeine    Immunization History  Administered Date(s) Administered   PFIZER(Purple Top)SARS-COV-2 Vaccination 07/27/2019, 08/17/2019, 02/02/2020     Objective: Vital Signs: BP (!) 149/81 (BP Location: Left Wrist, Patient Position: Sitting,  Cuff Size: Normal)   Pulse 81   Resp 17   Ht '5\' 6"'$  (1.676 m)   Wt 234 lb 9.6 oz (106.4 kg)   BMI 37.87 kg/m    Physical Exam Vitals and nursing note reviewed.  Constitutional:      Appearance: She is well-developed.  HENT:     Head: Normocephalic and atraumatic.  Eyes:     Conjunctiva/sclera: Conjunctivae normal.  Cardiovascular:     Rate and Rhythm: Normal rate and regular rhythm.     Heart sounds: Normal heart sounds.  Pulmonary:     Effort: Pulmonary effort is normal.     Breath sounds: Normal breath sounds.  Abdominal:     General: Bowel sounds are normal.     Palpations: Abdomen is soft.  Musculoskeletal:     Cervical back: Normal range of motion.  Lymphadenopathy:     Cervical: No cervical adenopathy.  Skin:    General: Skin is warm and dry.     Capillary Refill: Capillary refill takes less than 2 seconds.  Neurological:     Mental Status: She is alert and oriented to person, place, and time.  Psychiatric:        Behavior: Behavior normal.      Musculoskeletal Exam: Cervical spine was in good range of motion.  She had no tenderness over lumbar spine or SI joints.  Shoulder joints, elbow joints, wrist joints with good range of motion.  She had tenderness over left CMC joint.  She had thickening of left index finger flexor tendon.  PIP and DIP thickening with no synovitis was noted.  Hip joints in good range of motion.  Left knee joint was replaced.  She had mild tenderness over the lateral aspect of the left knee.  No effusion was noted.  Right knee joint was in good range of motion.  There was no tenderness over ankles or MTPs.  CDAI Exam: CDAI Score: -- Patient Global: --; Provider Global: -- Swollen: --; Tender: -- Joint Exam 05/09/2022   No joint exam has been documented for this visit   There is currently no information documented on the homunculus. Go to the Rheumatology activity and complete the homunculus joint exam.  Investigation: No additional  findings.  Imaging: No results found.  Recent Labs: Lab Results  Component Value Date   WBC 6.5 03/19/2022   HGB 13.8 03/19/2022   PLT 314 03/19/2022   NA 142 03/19/2022   K 4.6 03/19/2022   CL 105 03/19/2022   CO2 29 03/19/2022   GLUCOSE 92 03/19/2022   BUN 15 03/19/2022   CREATININE 0.74 03/19/2022   BILITOT 0.3 03/19/2022   ALKPHOS 94 11/09/2018   AST 13 03/19/2022   ALT 19 03/19/2022   PROT 7.4 03/19/2022   ALBUMIN 3.0 (L) 11/09/2018   CALCIUM 9.6 03/19/2022   GFRAA 95 06/26/2020   QFTBGOLDPLUS NEGATIVE 12/03/2021    Speciality Comments: No specialty  comments available.  Procedures:  No procedures performed Allergies: Dilaudid [hydromorphone hcl], Penicillins, Morphine sulfate, Penicillin v potassium, and Sulfamethoxazole-trimethoprim   Assessment / Plan:     Visit Diagnoses: Psoriatic arthropathy (HCC)-patient denies any episodes of psoriatic arthritis flare since last visit.  Although she continues to have some discomfort in her joints that she describes in her left [facility] joint, left trigger finger, left knee joint which has been replaced.  She has been taking Cosentyx 300 mg subcu every 28 days without interruption.  She denies any history of Planter fasciitis, Achilles tendinitis or uveitis.  She denies any history of shortness of breath.  Psoriasis-she had no lesions of psoriasis.  High risk medication use - Cosentyx 300 mg subcu every 28 days.  Labs obtained on March 19, 2022 CBC with differential and CMP with GFR were normal.  TB Gold was negative on December 03, 2021.  She was advised to get labs every 3 months to monitor for drug toxicity.  Information regarding immunization was placed in the AVS.  She was advised to hold Cosyntex if she develops an infection resume after the infection resolves.  Trigger index finger of left hand-she has been experiencing increased triggering of her left index finger.  Patient states she had a next injection in her left index finger  several years ago.  I do not have documentation of an injection in the chart.  I will schedule ultrasound guided injection.  Primary osteoarthritis of both hands-she continues to have some discomfort in her hands.  Bilateral CMC PIP and DIP thickening was noted.  She has been experiencing increased discomfort in the left CMC joint.  Use of Voltaren gel and CMC brace was advised.  Primary osteoarthritis of right knee-chronic pain.  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 Maureen Ralphs.  She has been experiencing some discomfort in the lateral aspect of her left knee.  No warmth or swelling was noted.  I advised her to schedule an appoint with Dr. Reynaldo Minium if her symptoms persist.  DDD lumbar spine-she has intermittent discomfort in her lower back.  She had no point tenderness on the examination.  She denies any radiculopathy.  Vitamin D deficiency-she takes vitamin D on a regular basis.  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  No orders of the defined types were placed in this encounter.   Follow-Up Instructions: Return in about 5 months (around 10/08/2022) for Psoriatic arthritis.   Bo Merino, MD  Note - This record has been created using Editor, commissioning.  Chart creation errors have been sought, but may not always  have been located. Such creation errors do not reflect on  the standard of medical care.

## 2022-05-01 NOTE — Telephone Encounter (Signed)
Received fax from [employer] for [place] patient assistance, patient's application has been DENIED due to exceeding financial criteria (per review for household of two- the threshold is [phone] for household of two). Patient's income documents show her application exceeding by only $200. There is process for reconsideration   ATC patient but phone rung and was cut off twice. MyChart message sent to patient   Phone# [phone]  Knox Saliva, PharmD, MPH, BCPS, CPP Clinical Pharmacist (Rheumatology and Pulmonology)

## 2022-05-02 ENCOUNTER — Telehealth: Payer: Self-pay | Admitting: Rheumatology

## 2022-05-02 NOTE — Telephone Encounter (Signed)
Patient called the office stating she missed a call from [facility] and requests a call back.

## 2022-05-06 ENCOUNTER — Other Ambulatory Visit (HOSPITAL_COMMUNITY): Payer: Self-pay

## 2022-05-06 NOTE — Telephone Encounter (Signed)
Returned call to patient. Documented in Cosentyx PAP encounte.

## 2022-05-06 NOTE — Telephone Encounter (Signed)
Returned call to patient regarding Cosentyx denial. She'd like to move forward with reconsideration since she has been on Cosentyx and is only a few hundred $$ over income threshold.  She has an appt on Thursday, 05/09/2022 and would like to complete reconsideration document at that visit. Handed to [employer] , CMA.  Knox Saliva, PharmD, MPH, BCPS, CPP Clinical Pharmacist (Rheumatology and Pulmonology)

## 2022-05-09 ENCOUNTER — Encounter: Payer: Self-pay | Admitting: Rheumatology

## 2022-05-09 ENCOUNTER — Telehealth: Payer: Self-pay | Admitting: Rheumatology

## 2022-05-09 ENCOUNTER — Ambulatory Visit: Payer: Medicare Other | Attending: Rheumatology | Admitting: Rheumatology

## 2022-05-09 VITALS — BP 149/81 | HR 81 | Resp 17 | Ht 66.0 in | Wt 234.6 lb

## 2022-05-09 DIAGNOSIS — L4050 Arthropathic psoriasis, unspecified: Secondary | ICD-10-CM | POA: Insufficient documentation

## 2022-05-09 DIAGNOSIS — M19041 Primary osteoarthritis, right hand: Secondary | ICD-10-CM | POA: Insufficient documentation

## 2022-05-09 DIAGNOSIS — Z8659 Personal history of other mental and behavioral disorders: Secondary | ICD-10-CM | POA: Insufficient documentation

## 2022-05-09 DIAGNOSIS — Z79899 Other long term (current) drug therapy: Secondary | ICD-10-CM | POA: Insufficient documentation

## 2022-05-09 DIAGNOSIS — E559 Vitamin D deficiency, unspecified: Secondary | ICD-10-CM | POA: Diagnosis present

## 2022-05-09 DIAGNOSIS — Z9884 Bariatric surgery status: Secondary | ICD-10-CM | POA: Insufficient documentation

## 2022-05-09 DIAGNOSIS — L409 Psoriasis, unspecified: Secondary | ICD-10-CM | POA: Insufficient documentation

## 2022-05-09 DIAGNOSIS — M65322 Trigger finger, left index finger: Secondary | ICD-10-CM | POA: Diagnosis present

## 2022-05-09 DIAGNOSIS — M47816 Spondylosis without myelopathy or radiculopathy, lumbar region: Secondary | ICD-10-CM | POA: Insufficient documentation

## 2022-05-09 DIAGNOSIS — M1711 Unilateral primary osteoarthritis, right knee: Secondary | ICD-10-CM | POA: Diagnosis present

## 2022-05-09 DIAGNOSIS — Z8719 Personal history of other diseases of the digestive system: Secondary | ICD-10-CM | POA: Diagnosis present

## 2022-05-09 DIAGNOSIS — M19042 Primary osteoarthritis, left hand: Secondary | ICD-10-CM | POA: Diagnosis present

## 2022-05-09 DIAGNOSIS — Z96652 Presence of left artificial knee joint: Secondary | ICD-10-CM | POA: Insufficient documentation

## 2022-05-09 NOTE — Telephone Encounter (Signed)
Please disregard message, form was found.

## 2022-05-09 NOTE — Patient Instructions (Signed)
Standing Labs We placed an order today for your standing lab work.   Please have your standing labs drawn in January and every 3 months  Please have your labs drawn 2 weeks prior to your appointment so that the provider can discuss your lab results at your appointment.  Please note that you may see your imaging and lab results in [place] before we have reviewed them. We will contact you once all results are reviewed. Please allow our office up to 72 hours to thoroughly review all of the results before contacting the office for clarification of your results.  Lab hours are:   Monday through Thursday from 8:00 am -12:30 pm and 1:00 pm-5:00 pm and Friday from 8:00 am-12:00 pm.  Please be advised, all patients with office appointments requiring lab work will take precedent over walk-in lab work.   Labs are drawn by [facility]. Please bring your co-pay at the time of your lab draw.  You may receive a bill from [facility] for your lab work.  Please note if you are on Hydroxychloroquine and and an order has been placed for a Hydroxychloroquine level, you will need to have it drawn 4 hours or more after your last dose.  If you wish to have your labs drawn at another location, please call the office 24 hours in advance so we can fax the orders.  The office is located at [address] [place], [place], [place] [address] No appointment is necessary.    If you have any questions regarding directions or hours of operation,  please call [phone].   As a reminder, please drink plenty of water prior to coming for your lab work. Thanks!   Vaccines You are taking a medication(s) that can suppress your immune system.  The following immunizations are recommended: Flu annually Covid-19  RSV Td/Tdap (tetanus, diphtheria, pertussis) every 10 years Pneumonia (Prevnar 15 then Pneumovax 23 at least 1 year apart.  Alternatively, can take Prevnar 20 without needing additional dose) Shingrix: 2 doses from 4  weeks to 6 months apart  Please check with your PCP to make sure you are up to date.   If you have signs or symptoms of an infection or start antibiotics: First, call your PCP for workup of your infection. Hold your medication through the infection, until you complete your antibiotics, and until symptoms resolve if you take the following: Injectable medication (Actemra, Benlysta, Cimzia, Cosentyx, Enbrel, Humira, Kevzara, Orencia, Remicade, Simponi, Stelara, Taltz, Tremfya) Methotrexate Leflunomide (Arava) Mycophenolate (Cellcept) Morrie Sheldon, Olumiant, or Rinvoq

## 2022-05-09 NOTE — Telephone Encounter (Signed)
Patient came into the office stating she had some paperwork from [facility] that she needed to fill out. Paperwork was not in front desk. Patient would like a call from [facility].

## 2022-05-10 ENCOUNTER — Ambulatory Visit: Payer: Medicare Other

## 2022-05-10 ENCOUNTER — Ambulatory Visit: Payer: Medicare Other | Attending: Rheumatology | Admitting: Rheumatology

## 2022-05-10 DIAGNOSIS — M65322 Trigger finger, left index finger: Secondary | ICD-10-CM | POA: Diagnosis not present

## 2022-05-10 MED ORDER — LIDOCAINE HCL 1 % IJ SOLN
0.5000 mL | INTRAMUSCULAR | Status: AC | PRN
  Administered 2022-05-10: .5 mL

## 2022-05-10 MED ORDER — TRIAMCINOLONE ACETONIDE 40 MG/ML IJ SUSP
10.0000 mg | INTRAMUSCULAR | Status: AC | PRN
  Administered 2022-05-10: 10 mg

## 2022-05-10 NOTE — Progress Notes (Signed)
   Procedure Note  Patient: Cassidy Reeves             Date of Birth: 18-Mar-1956           MRN: [id number]             Visit Date: 05/10/2022  Procedures: Visit Diagnoses:  1. Trigger index finger of left hand     Hand/UE Inj: L index A1 for trigger finger on 05/10/2022 12:10 PM Indications: pain, tendon swelling and therapeutic Details: 27 G needle, ultrasound-guided volar approach Medications: 0.5 mL lidocaine 1 %; 10 mg triamcinolone acetonide 40 MG/ML Aspirate: 0 mL Procedure, treatment alternatives, risks and benefits explained, specific risks discussed. Immediately prior to procedure a time out was called to verify the correct patient, procedure, equipment, support staff and site/side marked as required. Patient was prepped and draped in the usual sterile fashion.     Ultrasound guided injection is preferred based studies that show increased duration, increased effect, greater accuracy, decreased procedural pain, increased response rate, and decreased cost with ultrasound guided versus blind injection.   Verbal informed consent obtained.  Time-out conducted.  Noted no overlying erythema, induration, or other signs of local infection. Ultrasound-guided trigger finger injection: After sterile prep with Betadine, injected 0.5 mL of 1% lidocaine and 10 mg Kenalog using a 27-gauge needle, left index finger flexor tendon sheath.    Patient tolerated the procedure well.  Postprocedure instructions were given.  A splint was applied.  Bo Merino, MD

## 2022-05-16 NOTE — Telephone Encounter (Signed)
Faxed [employer] denial reconsideration documents that patient completed at [place] on 05/09/2022.  Fax: [phone] Phone: [phone]  Knox Saliva, PharmD, MPH, BCPS, CPP Clinical Pharmacist (Rheumatology and Pulmonology)

## 2022-05-21 ENCOUNTER — Ambulatory Visit
Admission: EM | Admit: 2022-05-21 | Discharge: 2022-05-21 | Disposition: A | Discharge status: Home / Self Care | Payer: Medicare Other | Attending: Physician Assistant | Admitting: Physician Assistant

## 2022-05-21 DIAGNOSIS — H60503 Unspecified acute noninfective otitis externa, bilateral: Secondary | ICD-10-CM

## 2022-05-21 MED ORDER — OFLOXACIN 0.3 % OT SOLN: 5.0000 [drp] | Freq: Two times a day (BID) | OTIC | 0 refills | Status: AC

## 2022-05-21 MED ORDER — CIPROFLOXACIN-DEXAMETHASONE 0.3-0.1 % OT SUSP: 4.0000 [drp] | Freq: Two times a day (BID) | OTIC | 0 refills | Status: DC

## 2022-05-21 NOTE — ED Triage Notes (Signed)
Pt presents with bilateral ear itchiness and right ear fullness X 2 days.

## 2022-05-21 NOTE — ED Provider Notes (Addendum)
[facility]    CSN: [id number] Arrival date & time: 05/21/22  1656      History   Chief Complaint Chief Complaint  Patient presents with   Ear Fullness    HPI Cassidy Reeves is a 67 y.o. female.   Patient here today for evaluation of bilateral ear itchiness and right ear fullness that started 2 days ago. She denies any congestion or sore throat. She has not had fever. She has tried rinsing her ears but states this seemed to be what caused symptoms.  The history is provided by the patient.    Past Medical History:  Diagnosis Date   Anxiety    Arthritis    GERD (gastroesophageal reflux disease)    History of kidney stones    Pelvic mass    RA (rheumatoid arthritis) (HCC)    UTI (urinary tract infection) 11/09/2018    Patient Active Problem List   Diagnosis Date Noted   UTI (urinary tract infection) 11/09/2018   History of depression 05/22/2017   Primary osteoarthritis of both hands 07/22/2016   DDD lumbar spine 07/22/2016   Trigger finger 07/10/2016   Psoriatic arthropathy ([place]) 07/10/2016   High risk medication use 06/13/2016   Primary osteoarthritis of both knees 06/13/2016   History of gastric bypass 06/13/2016   Vitamin D deficiency 06/13/2016   Psoriasis 02/18/2013   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      Home Medications    Prior to Admission medications   Medication Sig Start Date End Date Taking? Authorizing Provider  ofloxacin (FLOXIN) 0.3 % OTIC solution Place 5 drops into both ears 2 (two) times daily for 7 days. 05/21/22 05/28/22 Yes Francene Finders, PA-C  ALPRAZolam Duanne Moron) 0.5 MG tablet Take 0.5 mg by mouth daily as  needed for anxiety.  10/13/11   [provider]  ALPRAZolam Duanne Moron) 0.5 MG tablet Take 1 tablet by mouth daily as needed. 05/22/21   [provider]  CALCIUM PO Take by mouth daily.    [provider]  diclofenac sodium (VOLTAREN) 1 % GEL Apply 2 to 4 g to affected area up to 4 times daily as needed. Patient not taking: Reported on 05/09/2022 12/14/18   Ofilia Neas, PA-C  esomeprazole (NEXIUM) 40 MG capsule Take 40 mg by mouth daily. 08/28/19   [provider]  hydrOXYzine (ATARAX/VISTARIL) 25 MG tablet Take 25 mg by mouth 3 (three) times daily as needed for itching. Patient not taking: Reported on 05/09/2022 09/15/18   [provider]  IBUPROFEN PO Take by mouth as needed.    [provider]  meclizine (ANTIVERT) 25 MG tablet 1 tablet as needed for vertigo 02/07/20   [provider]  Multiple Vitamin (MULTIVITAMIN) tablet Take 1 tablet by mouth daily.    [provider]  Secukinumab, 300 MG Dose, (COSENTYX SENSOREADY, 300 MG,) 150 MG/ML SOAJ INJECT 300 MG INTO THE SKIN EVERY 28 DAYS 04/17/22   Ofilia Neas, PA-C  VITAMIN D PO Take 1 tablet by mouth daily.    [provider]  zolpidem (AMBIEN) 10 MG tablet Take 10 mg by mouth at bedtime as needed for sleep. At bedtime as needed  09/04/11   [provider]    Family History Family History  Problem Relation Age of Onset   Heart disease Father    Arthritis Father    Psoriasis Father    Parkinsonism Father    Other Father 79       pacemaker   Kidney disease Maternal Uncle    Stroke Maternal Grandmother    Heart failure Paternal Grandmother    Colon cancer Paternal Grandfather    Esophageal cancer Neg Hx    Rectal cancer Neg Hx    Stomach cancer Neg Hx     Social History Social History   Tobacco Use   Smoking status: Never    Passive exposure: Never   Smokeless tobacco: Never  Vaping Use   Vaping Use: Never used  Substance Use Topics    Alcohol use: Yes    Comment: occasionally   Drug use: Never     Allergies   Dilaudid [hydromorphone hcl], Penicillins, Morphine sulfate, Penicillin v potassium, and Sulfamethoxazole-trimethoprim   Review of Systems Review of Systems  Constitutional:  Negative for chills and fever.  HENT:  Positive for ear pain. Negative for congestion and sore throat.   Eyes:  Negative for discharge and redness.  Respiratory:  Negative for cough, shortness of breath and wheezing.   Gastrointestinal:  Negative for abdominal pain, diarrhea, nausea and vomiting.     Physical Exam Triage Vital Signs ED Triage Vitals  Enc Vitals Group     BP      Pulse      Resp      Temp      Temp src      SpO2      Weight      Height      Head Circumference      Peak Flow      Pain Score      Pain Loc      Pain Edu?      Excl. in [place]?    No data found.  Updated Vital Signs BP 135/72 (BP Location: Right Arm)   Pulse 65   Temp 98 F (36.7 C) (Oral)   Resp 17   SpO2 97%     Physical Exam Vitals and nursing note reviewed.  Constitutional:      General: She is not in acute distress.    Appearance: Normal appearance. She is not ill-appearing.  HENT:     Head: Normocephalic and atraumatic.     Right Ear: Tympanic membrane normal.     Left Ear: Tympanic membrane normal.     Ears:     Comments: Bilateral EACS are inflamed- right worse than left, tragal/ auricle movement tenderness noted to right    Nose: No congestion.     Mouth/Throat:     Mouth: Mucous membranes are moist.     Pharynx: No oropharyngeal exudate or posterior oropharyngeal erythema.  Eyes:     Conjunctiva/sclera: Conjunctivae normal.  Cardiovascular:     Rate and Rhythm: Normal rate.  Pulmonary:     Effort: Pulmonary effort is normal. No respiratory distress.  Skin:    General: Skin is warm and dry.  Neurological:     Mental Status: She is alert.  Psychiatric:        Mood and Affect: Mood normal.        Thought  Content:  Thought content normal.      UC Treatments / Results  Labs (all labs ordered are listed, but only abnormal results are displayed) Labs Reviewed - No data to display  EKG   Radiology No results found.  Procedures Procedures (including critical care time)  Medications Ordered in UC Medications - No data to display  Initial Impression / Assessment and Plan / UC Course  I have reviewed the triage vital signs and the nursing notes.  Pertinent labs & imaging results that were available during my care of the patient were reviewed by me and considered in my medical decision making (see chart for details).    Suspect bilateral otitis externa- will treat with Ciprodex drops and encouraged follow up if no gradual improvement or with any worsening. Patient expresses understanding.   Ciprodex too expensive- patient called for alternative. Ofloxacin drops prescribed.  Final Clinical Impressions(s) / UC Diagnoses   Final diagnoses:  Acute otitis externa of both ears, unspecified type   Discharge Instructions   None    ED Prescriptions     Medication Sig Dispense Auth. Provider   ciprofloxacin-dexamethasone (CIPRODEX) OTIC suspension  (Status: Discontinued) Place 4 drops into both ears 2 (two) times daily for 7 days. 7.5 mL Francene Finders, PA-C   ofloxacin (FLOXIN) 0.3 % OTIC solution Place 5 drops into both ears 2 (two) times daily for 7 days. 5 mL Francene Finders, PA-C      PDMP not reviewed this encounter.   Francene Finders, PA-C 05/21/22 1924    Francene Finders, PA-C 05/21/22 [phone]

## 2022-06-05 NOTE — Telephone Encounter (Signed)
Spoke with [employer] patient rep. The Cosentyx denial reconsideration is still in process. She stated to check back sometime next week for determination.   Maryan Puls, PharmD PGY-1 [facility] Resident

## 2022-06-14 NOTE — Telephone Encounter (Signed)
Called [employer] for update on patient's Cosentyx application for patient assistance application. Rep has moved denial to intake team despite rep advising Allyson of the same on 06/05/2022. Rep now stating it will take 2-3 business days to process.  Phone: [phone]  Knox Saliva, PharmD, MPH, BCPS, CPP Clinical Pharmacist (Rheumatology and Pulmonology)

## 2022-07-04 ENCOUNTER — Encounter: Payer: Self-pay | Admitting: Rheumatology

## 2022-07-05 NOTE — Telephone Encounter (Signed)
Called [employer] for update on patient's Cosentyx PAP renewal. Per rep, appeal is still being processed. Rep states that patient is eligible for gratuity fill since they are holding up the process. Mychart message sent to patient to advise to reach out for this gratuity fill.  Will continue to follow-up with [employer] PAP for determination  Phone: [phone]  Knox Saliva, PharmD, MPH, BCPS, CPP Clinical Pharmacist (Rheumatology and Pulmonology)

## 2022-07-12 ENCOUNTER — Telehealth: Payer: Self-pay

## 2022-07-12 DIAGNOSIS — L4050 Arthropathic psoriasis, unspecified: Secondary | ICD-10-CM

## 2022-07-12 DIAGNOSIS — L409 Psoriasis, unspecified: Secondary | ICD-10-CM

## 2022-07-12 DIAGNOSIS — Z79899 Other long term (current) drug therapy: Secondary | ICD-10-CM

## 2022-07-12 NOTE — Telephone Encounter (Signed)
Patient called and stated that she had worked with the pharmacy team and put in an appeal for her cosentyx. Patient states that her appeal was denied and she would like to know what to do. Patient states that she cannot afford the medication and that she is due for more on March 4th, 2024. Patient advised that I would send the pharmacy team a message and that they would contact her.

## 2022-07-16 ENCOUNTER — Other Ambulatory Visit (HOSPITAL_COMMUNITY): Payer: Self-pay

## 2022-07-16 MED ORDER — COSENTYX SENSOREADY (300 MG) 150 MG/ML ~~LOC~~ SOAJ: SUBCUTANEOUS | 0 refills | Status: DC

## 2022-07-16 NOTE — Addendum Note (Signed)
Addended by: Cassandria Anger on: 07/16/2022 12:05 PM   Modules accepted: Orders

## 2022-07-16 NOTE — Telephone Encounter (Signed)
Received denial from [employer] for Cosentyx PAP appeal. PAP denial is UPHELD.   Patient states she thinks she will go ahead and pay out of pocket for it. Per test claim today, copay is $2127.96. I did discuss changes to Medicare and that max out-of-pocket for her would be around $3500 for the calendar year.  She states she has started a part-time job (with no health benefits) and can try to pay for it with this supplemental income. Her husband has stage four cancer. She has been advised that if she has decrease in income we can try to reapply though if she chooses to pay, her copay will be $0 after she's paid out-of-pocket max for the year. Rx has been sent to [facility] today and patient provided with phone number to contact hem to schedule fill.    I also mentioned the option of switching to Donnetta Hail since it is a class switch. I advised that response is never guaranteed without adequate medication trial of at least 3 months. She is hesitant to switch medications at this time because she has been doing very well on Cosentyx thus far.   She may require sample of Cosentyx - she is due 07/22/2022.   She will also stop by clinic for updated labs - last completed at the end of October 2023   Knox Saliva, PharmD, MPH, BCPS, CPP Clinical Pharmacist (Rheumatology and Pulmonology)

## 2022-07-16 NOTE — Telephone Encounter (Signed)
Received denial from [employer] for Cosentyx PAP appeal. PAP denial is UPHELD.  Patient states she thinks she will go ahead and pay out of pocket for it. Per test claim today, copay is $2127.96. I did discuss changes to Medicare and that max out-of-pocket for her would be around $3500 for the calendar year.  She states she has started a part-time job (with no health benefits) and can try to pay for it with this supplemental income. Her husband has stage four cancer. She has been advised that if she has decrease in income we can try to reapply though if she chooses to pay, her copay will be $0 after she's paid out-of-pocket max for the year.  I also mentioned the option of switching to Donnetta Hail since it is a class switch. I advised that response is never guaranteed without adequate medication trial of at least 3 months. She is hesitant to switch medications at this time because she has been doing very well on Cosentyx thus far.  She may require sample of Cosentyx - she is due 07/22/2022.  She will also stop by clinic for updated labs - last completed at the end of October 2023  Knox Saliva, PharmD, MPH, BCPS, CPP Clinical Pharmacist (Rheumatology and Pulmonology)

## 2022-07-18 ENCOUNTER — Other Ambulatory Visit: Payer: Self-pay | Admitting: *Deleted

## 2022-07-18 ENCOUNTER — Ambulatory Visit: Payer: Medicare Other | Attending: Rheumatology | Admitting: Rheumatology

## 2022-07-18 ENCOUNTER — Ambulatory Visit: Payer: Medicare Other

## 2022-07-18 DIAGNOSIS — L4050 Arthropathic psoriasis, unspecified: Secondary | ICD-10-CM

## 2022-07-18 DIAGNOSIS — Z79899 Other long term (current) drug therapy: Secondary | ICD-10-CM

## 2022-07-18 DIAGNOSIS — M65322 Trigger finger, left index finger: Secondary | ICD-10-CM | POA: Diagnosis present

## 2022-07-18 DIAGNOSIS — L409 Psoriasis, unspecified: Secondary | ICD-10-CM

## 2022-07-18 MED ORDER — LIDOCAINE HCL 1 % IJ SOLN
0.5000 mL | INTRAMUSCULAR | Status: AC | PRN
  Administered 2022-07-18: .5 mL

## 2022-07-18 MED ORDER — TRIAMCINOLONE ACETONIDE 40 MG/ML IJ SUSP
10.0000 mg | INTRAMUSCULAR | Status: AC | PRN
  Administered 2022-07-18: 10 mg

## 2022-07-18 NOTE — Progress Notes (Signed)
   Procedure Note  Patient: Cassidy Reeves             Date of Birth: 07/15/1955           MRN: [id number]             Visit Date: 07/18/2022  Procedures: Visit Diagnoses:  1. Trigger index finger of left hand   Patient returns today to get left index finger trigger finger injection.  She continues to have triggering of the finger and discomfort.  Ultrasound guided injection is preferred based studies that show increased duration, increased effect, greater accuracy, decreased procedural pain, increased response rate, and decreased cost with ultrasound guided versus blind injection.   Verbal informed consent obtained.  Time-out conducted.  Noted no overlying erythema, induration, or other signs of local infection. Ultrasound-guided trigger finger injection: After sterile prep with Betadine, injected 0.5 mL of 1% lidocaine and 10 mg Kenalog using a 27-gauge needle, and the flexor tendon sheath.    Hand/UE Inj: L index A1 for trigger finger on 07/18/2022 8:34 AM Indications: pain, tendon swelling and therapeutic Details: 27 G needle, ultrasound-guided volar approach Medications: 0.5 mL lidocaine 1 %; 10 mg triamcinolone acetonide 40 MG/ML Aspirate: 0 mL Procedure, treatment alternatives, risks and benefits explained, specific risks discussed. Immediately prior to procedure a time out was called to verify the correct patient, procedure, equipment, support staff and site/side marked as required. Patient was prepped and draped in the usual sterile fashion.    Patient tolerated the procedure well.  Postprocedure instructions were given.  Splint was applied.  Bo Merino, MD

## 2022-07-19 LAB — COMPLETE METABOLIC PANEL WITH GFR
AG Ratio: 1.4 (calc) (ref 1.0–2.5)
ALT: 20 U/L (ref 6–29)
AST: 20 U/L (ref 10–35)
Albumin: 4.1 g/dL (ref 3.6–5.1)
Alkaline phosphatase (APISO): 85 U/L (ref 37–153)
BUN: 16 mg/dL (ref 7–25)
CO2: 27 mmol/L (ref 20–32)
Calcium: 9 mg/dL (ref 8.6–10.4)
Chloride: 106 mmol/L (ref 98–110)
Creat: 0.67 mg/dL (ref 0.50–1.05)
Globulin: 2.9 g/dL (calc) (ref 1.9–3.7)
Glucose, Bld: 111 mg/dL — ABNORMAL HIGH (ref 65–99)
Potassium: 4.2 mmol/L (ref 3.5–5.3)
Sodium: 141 mmol/L (ref 135–146)
Total Bilirubin: 0.2 mg/dL (ref 0.2–1.2)
Total Protein: 7 g/dL (ref 6.1–8.1)
eGFR: 96 mL/min/{1.73_m2} (ref >=60)

## 2022-07-19 LAB — CBC WITH DIFFERENTIAL/PLATELET
Absolute Monocytes: 735 cells/uL (ref 200–950)
Basophils Absolute: 98 cells/uL (ref 0–200)
Basophils Relative: 1.5 %
Eosinophils Absolute: 312 cells/uL (ref 15–500)
Eosinophils Relative: 4.8 %
HCT: 40.6 % (ref 35.0–45.0)
Hemoglobin: 13.5 g/dL (ref 11.7–15.5)
Lymphs Abs: 1820 cells/uL (ref 850–3900)
MCH: 27.8 pg (ref 27.0–33.0)
MCHC: 33.3 g/dL (ref 32.0–36.0)
MCV: 83.7 fL (ref 80.0–100.0)
MPV: 11.1 fL (ref 7.5–12.5)
Monocytes Relative: 11.3 %
Neutro Abs: 3536 cells/uL (ref 1500–7800)
Neutrophils Relative %: 54.4 %
Platelets: 319 10*3/uL (ref 140–400)
RBC: 4.85 10*6/uL (ref 3.80–5.10)
RDW: 13.7 % (ref 11.0–15.0)
Total Lymphocyte: 28 %
WBC: 6.5 10*3/uL (ref 3.8–10.8)

## 2022-07-19 NOTE — Progress Notes (Signed)
CBC and CMP are stable.

## 2022-07-22 ENCOUNTER — Telehealth: Payer: Self-pay | Admitting: *Deleted

## 2022-07-22 NOTE — Telephone Encounter (Signed)
Medication Samples have been provided to the patient.  Drug name: Cosentyx       Strength: 300 mg        Qty: 1  LOT: [id number]  Exp.Date: July 2024  Dosing instructions: Inject 300 mg into the skin every 28 days.

## 2022-08-08 ENCOUNTER — Other Ambulatory Visit: Payer: Self-pay | Admitting: Physician Assistant

## 2022-08-08 DIAGNOSIS — L409 Psoriasis, unspecified: Secondary | ICD-10-CM

## 2022-08-08 DIAGNOSIS — L4050 Arthropathic psoriasis, unspecified: Secondary | ICD-10-CM

## 2022-08-08 NOTE — Telephone Encounter (Signed)
Last Fill: 07/16/2022 (30 day supply)  Labs: 07/18/2022 CBC and CMP are stable.   TB Gold: 12/03/2021 Neg    Next Visit: 10/10/2022  Last Visit: 05/09/2022  DX: Psoriatic arthropathy   Current Dose per office note 05/09/2022: Cosentyx 300 mg subcu every 28 days.   Okay to refill Cosentyx?

## 2022-09-19 ENCOUNTER — Other Ambulatory Visit: Payer: Medicare Other | Admitting: Rheumatology

## 2022-09-26 NOTE — Progress Notes (Deleted)
Office Visit Note  Patient: Cassidy Reeves             Date of Birth: [birth year]           MRN: [id number]             PCP: Gaspar Garbe, MD Referring: Gaspar Garbe, MD Visit Date: 10/10/2022 Occupation: @GUAROCC @  Subjective:  No chief complaint on file.   History of Present Illness: Cassidy Reeves is a 67 y.o. female ***     Activities of Daily Living:  Patient reports morning stiffness for *** {minute/hour:19697}.   Patient {ACTIONS;DENIES/REPORTS:21021675::"Denies"} nocturnal pain.  Difficulty dressing/grooming: {ACTIONS;DENIES/REPORTS:21021675::"Denies"} Difficulty climbing stairs: {ACTIONS;DENIES/REPORTS:21021675::"Denies"} Difficulty getting out of chair: {ACTIONS;DENIES/REPORTS:21021675::"Denies"} Difficulty using hands for taps, buttons, cutlery, and/or writing: {ACTIONS;DENIES/REPORTS:21021675::"Denies"}  No Rheumatology ROS completed.   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    Past Medical History:  Diagnosis Date   Anxiety    Arthritis    GERD (gastroesophageal reflux disease)    History of kidney stones    Pelvic mass    RA (rheumatoid arthritis) (HCC)    UTI (urinary tract infection) 11/09/2018    Family History  Problem Relation Age of Onset   Heart disease Father    Arthritis Father    Psoriasis Father    Parkinsonism Father    Other Father 81       pacemaker   Kidney disease Maternal Uncle    Stroke Maternal Grandmother    Heart failure Paternal  Grandmother    Colon cancer Paternal Grandfather    Esophageal cancer Neg Hx    Rectal cancer Neg Hx    Stomach cancer Neg Hx    Past Surgical History:  Procedure Laterality Date   ABDOMINAL HYSTERECTOMY     APPENDECTOMY     BACK SURGERY     CHOLECYSTECTOMY     EXPLORATORY LAPAROTOMY     GASTRIC BYPASS     KNEE ARTHROSCOPY     LITHOTRIPSY     REPLACEMENT TOTAL KNEE Left 12/26/2020   Social History   Social History Narrative   Daily caffeine    Immunization History  Administered Date(s) Administered   PFIZER(Purple Top)SARS-COV-2 Vaccination 07/27/2019, 08/17/2019, 02/02/2020     Objective: Vital Signs: There were no vitals taken for this visit.   Physical Exam   Musculoskeletal Exam: ***  CDAI Exam: CDAI Score: -- Patient Global: --; Provider Global: -- Swollen: --; Tender: -- Joint Exam 10/10/2022   No joint exam has been documented for this visit   There is currently no information documented on the homunculus. Go to the Rheumatology activity and complete the homunculus joint exam.  Investigation: No additional findings.  Imaging: No results found.  Recent Labs: Lab Results  Component Value Date   WBC 6.5 07/18/2022   HGB 13.5 07/18/2022   PLT 319 07/18/2022   NA 141 07/18/2022   K 4.2 07/18/2022   CL 106 07/18/2022   CO2 27 07/18/2022   GLUCOSE 111 (H) 07/18/2022  BUN 16 07/18/2022   CREATININE 0.67 07/18/2022   BILITOT 0.2 07/18/2022   ALKPHOS 94 11/09/2018   AST 20 07/18/2022   ALT 20 07/18/2022   PROT 7.0 07/18/2022   ALBUMIN 3.0 (L) 11/09/2018   CALCIUM 9.0 07/18/2022   GFRAA 95 06/26/2020   QFTBGOLDPLUS NEGATIVE 12/03/2021    Speciality Comments: No specialty comments available.  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  No orders of the defined types were placed in this encounter.   Face-to-face time spent with patient was *** minutes. Greater than 50% of time was spent in counseling and coordination of care.  Follow-Up Instructions: No follow-ups on file.   Gearldine Bienenstock, PA-C  Note - This record has been created using Dragon software.  Chart creation errors have been sought, but may not always  have been located. Such creation errors do not reflect on  the standard of medical care.

## 2022-10-10 ENCOUNTER — Ambulatory Visit: Payer: Medicare Other | Admitting: Physician Assistant

## 2022-10-10 DIAGNOSIS — Z8659 Personal history of other mental and behavioral disorders: Secondary | ICD-10-CM

## 2022-10-10 DIAGNOSIS — Z9884 Bariatric surgery status: Secondary | ICD-10-CM

## 2022-10-10 DIAGNOSIS — L4050 Arthropathic psoriasis, unspecified: Secondary | ICD-10-CM

## 2022-10-10 DIAGNOSIS — Z79899 Other long term (current) drug therapy: Secondary | ICD-10-CM

## 2022-10-10 DIAGNOSIS — L409 Psoriasis, unspecified: Secondary | ICD-10-CM

## 2022-10-10 DIAGNOSIS — M19041 Primary osteoarthritis, right hand: Secondary | ICD-10-CM

## 2022-10-10 DIAGNOSIS — Z96652 Presence of left artificial knee joint: Secondary | ICD-10-CM

## 2022-10-10 DIAGNOSIS — E559 Vitamin D deficiency, unspecified: Secondary | ICD-10-CM

## 2022-10-10 DIAGNOSIS — M47816 Spondylosis without myelopathy or radiculopathy, lumbar region: Secondary | ICD-10-CM

## 2022-10-10 DIAGNOSIS — M1711 Unilateral primary osteoarthritis, right knee: Secondary | ICD-10-CM

## 2022-10-10 DIAGNOSIS — Z8719 Personal history of other diseases of the digestive system: Secondary | ICD-10-CM

## 2022-10-10 DIAGNOSIS — M65322 Trigger finger, left index finger: Secondary | ICD-10-CM

## 2022-10-10 NOTE — Progress Notes (Unsigned)
Office Visit Note  Patient: Cassidy Reeves             Date of Birth: [birth year]           MRN: [id number]             PCP: Gaspar Garbe, MD Referring: Gaspar Garbe, MD Visit Date: 10/24/2022 Occupation: @GUAROCC @  Subjective:  Bilateral CMC joint pain    History of Present Illness: Kent Stuller is a 67 y.o. female with history of psoriatic arthritis and osteoarthritis.  She remains on Cosentyx 300 mg sq injections every 28 days. She continues to tolerate cosentyx without any side effects and has not missed any doses recently. She denies any signs or symptoms of a psoriatic arthritis flare.  She states that she has been experiencing increased pain and stiffness in bilateral CMC joints.  She has tried using a thumb brace with minimal improvement.  She has not been using any topical agents for pain relief.  Patient states that her left index trigger finger resolved after having a cortisone injection performed on 07/18/2022.  She states that she has noticed locking and tenderness in the left middle finger but is not yet ready to schedule an injection. She denies any Achilles tendinitis or plantar fasciitis.  She denies any SI joint pain.  She denies any active psoriasis at this time.  She has been going to the [facility] and has been increasing her exercise management with strength training and cardio. She denies any new medical conditions.  She denies any recent or recurrent infections.   Activities of Daily Living:  Patient reports morning stiffness for 1 hour  Patient Denies nocturnal pain.  Difficulty dressing/grooming: Denies Difficulty climbing stairs: Reports Difficulty getting out of chair: Reports Difficulty using hands for taps, buttons, cutlery, and/or writing: Denies  Review of Systems  Constitutional:  Negative for fatigue.  HENT:  Negative for mouth sores, mouth dryness and nose dryness.   Eyes:  Negative for pain, visual disturbance and dryness.  Respiratory:  Negative  for cough, hemoptysis, shortness of breath and difficulty breathing.   Cardiovascular:  Negative for chest pain, palpitations, hypertension and swelling in legs/feet.  Gastrointestinal:  Negative for blood in stool, constipation and diarrhea.  Endocrine: Negative for increased urination.  Genitourinary:  Negative for painful urination.  Musculoskeletal:  Positive for joint pain, joint pain and morning stiffness. Negative for joint swelling, myalgias, muscle weakness, muscle tenderness and myalgias.  Skin:  Negative for color change, pallor, rash, hair loss, nodules/bumps, skin tightness, ulcers and sensitivity to sunlight.  Allergic/Immunologic: Negative for susceptible to infections.  Neurological:  Negative for dizziness, numbness, headaches and weakness.  Hematological:  Negative for swollen glands.  Psychiatric/Behavioral:  Negative for depressed mood and sleep disturbance. The patient is not nervous/anxious.     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    Past Medical History:  Diagnosis Date   Anxiety    Arthritis    GERD (gastroesophageal reflux disease)    History  of kidney stones    Pelvic mass    RA (rheumatoid arthritis) (HCC)    UTI (urinary tract infection) 11/09/2018    Family History  Problem Relation Age of Onset   Heart disease Father    Arthritis Father    Psoriasis Father    Parkinsonism Father    Other Father 31       pacemaker   Kidney disease Maternal Uncle    Stroke Maternal  Grandmother    Heart failure Paternal Grandmother    Colon cancer Paternal Grandfather    Esophageal cancer Neg Hx    Rectal cancer Neg Hx    Stomach cancer Neg Hx    Past Surgical History:  Procedure Laterality Date   ABDOMINAL HYSTERECTOMY     APPENDECTOMY     BACK SURGERY     CHOLECYSTECTOMY     EXPLORATORY LAPAROTOMY     GASTRIC BYPASS     KNEE ARTHROSCOPY     LITHOTRIPSY     REPLACEMENT TOTAL KNEE Left 12/26/2020   Social History   Social History Narrative   Daily caffeine    Immunization History  Administered Date(s) Administered   PFIZER(Purple Top)SARS-COV-2 Vaccination 07/27/2019, 08/17/2019, 02/02/2020     Objective: Vital Signs: BP 116/74 (BP Location: Left Arm, Patient Position: Sitting, Cuff Size: Large)   Pulse 66   Resp 16   Ht 5' 5.5" (1.664 m)   Wt 245 lb (111.1 kg)   BMI 40.15 kg/m    Physical Exam Vitals and nursing note reviewed.  Constitutional:      Appearance: She is well-developed.  HENT:     Head: Normocephalic and atraumatic.  Eyes:     Conjunctiva/sclera: Conjunctivae normal.  Cardiovascular:     Rate and Rhythm: Normal rate and regular rhythm.     Heart sounds: Normal heart sounds.  Pulmonary:     Effort: Pulmonary effort is normal.     Breath sounds: Normal breath sounds.  Abdominal:     General: Bowel sounds are normal.     Palpations: Abdomen is soft.  Musculoskeletal:     Cervical back: Normal range of motion.  Lymphadenopathy:     Cervical: No cervical adenopathy.  Skin:    General: Skin is warm and dry.     Capillary Refill: Capillary refill takes less than 2 seconds.  Neurological:     Mental Status: She is alert and oriented to person, place, and time.  Psychiatric:        Behavior: Behavior normal.     Musculoskeletal Exam: Warm C-spine, thoracic spine, lumbar spine have good range of motion.  No midline spinal tenderness or SI joint tenderness.  Shoulder joints, elbow joints, wrist joints, MCPs, PIPs, DIPs have  good range of motion with no synovitis.  Complete fist formation bilaterally.  Tenderness over bilateral CMC joints.  Prominence of the left CMC joint noted.  Mild PIP and DIP thickening consistent with osteoarthritis of both hands.  Hip joints have good range of motion with no groin pain.  Left knee replacement has good range of motion with some stiffness with full flexion.  Right knee has full range of motion with no warmth or effusion.  Ankle joints have good range of motion with no tenderness or joint swelling.  No tenderness or synovitis over MTP joints.  No evidence of Achilles tendinitis or plantar fasciitis.  CDAI Exam: CDAI Score: -- Patient Global: --; Provider Global: -- Swollen: 0 ; Tender: 2  Joint Exam 10/24/2022  Right  Left  CMC   Tender   Tender     Investigation: No additional findings.  Imaging: No results found.  Recent Labs: Lab Results  Component Value Date   WBC 6.5 07/18/2022   HGB 13.5 07/18/2022   PLT 319 07/18/2022   NA 141 07/18/2022   K 4.2 07/18/2022   CL 106 07/18/2022   CO2 27 07/18/2022   GLUCOSE 111 (H) 07/18/2022   BUN 16 07/18/2022   CREATININE 0.67 07/18/2022   BILITOT 0.2 07/18/2022   ALKPHOS 94 11/09/2018   AST 20 07/18/2022   ALT 20 07/18/2022   PROT 7.0 07/18/2022   ALBUMIN 3.0 (L) 11/09/2018   CALCIUM 9.0 07/18/2022   GFRAA 95 06/26/2020   QFTBGOLDPLUS NEGATIVE 12/03/2021    Speciality Comments: No specialty comments available.  Procedures:  No procedures performed Allergies: Dilaudid [hydromorphone hcl], Penicillins, Morphine sulfate, Penicillin v potassium, and Sulfamethoxazole-trimethoprim   Assessment / Plan:     Visit Diagnoses: Psoriatic arthropathy (HCC): She has no synovitis or dactylitis on examination today.  She has not had any signs or symptoms of a psoriatic arthritis flare.  She has clinically been doing well on Cosentyx 300 mg subcutaneous injections every 28 days.  She has been tolerating Cosentyx without  any side effects or injection site reactions.  She has not missed any doses of Cosentyx recently.  She has no SI joint tenderness upon palpation.  No evidence of Achilles tendinitis or plantar fasciitis.  No active psoriasis at this time.  She will remain on Cosentyx as prescribed as monotherapy.  She was advised to notify us if she develops signs or symptoms of a flare.  She will follow-up in the office in 5 months or sooner if needed.  Psoriasis: No active psoriasis at this time.   High risk medication use - Cosentyx 300 mg sq injections every 28 days. TB gold negative on 12/03/21.Order for TB gold placed today.  CBC and CMP updated on 07/18/22.  Orders for CBC and CMP released today.  Her next lab work will be due in September and every 3 months. Standing orders for CBC and CMP remain in place.  No recent or recurrent infections. Discussed the importance of holding cosentyx if she develops signs or symptoms of an infection and to resume once the infection has completely cleared.  - Plan: CBC with Differential/Platelet, COMPLETE METABOLIC PANEL WITH GFR  Screening for tuberculosis -Order for TB gold released today.  Plan: QuantiFERON-TB Gold Plus  Trigger index finger of left hand: Resolved.  Ultrasound-guided injection performed on 07/18/2022 has alleviated her symptoms.  Trigger finger, left middle finger: She has been experiencing intermittent locking.  No tenderness noted upon palpation today.  She will notify us if her symptoms persist or worsen at which time we can schedule an ultrasound-guided cortisone injection if needed.  Primary osteoarthritis of both hands: PIP and DIP thickening consistent with osteoarthritis of both hands.  She presents today with bilateral CMC joint pain.  Prominence of the left CMC joint noted.  Tenderness of bilateral CMC joints noted on examination today.  No synovitis or dactylitis was noted.  Different treatment options were discussed.  Discussed the importance of  joint protection and muscle strengthening.  She was given a prescription for [facility] joint braces for both hands.  Discussed the use of Voltaren gel which she can apply topically as needed for pain relief.  Primary osteoarthritis of right knee: Good range of motion of the right knee joint.  No warmth or effusion noted.  S/P TKR (total knee replacement), left - Performed on 12/26/2020 by Dr. Despina Hick.  Doing well.  Good range of motion with no discomfort.  DDD lumbar spine: No midline spinal tenderness.  No symptoms of radiculopathy.  Other medical conditions are listed as follows:   Vitamin D deficiency: She is taking a daily  vitamin D supplement.   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     Follow-Up Instructions: Return in about 5 months (around 03/26/2023) for Psoriatic arthritis.   Gearldine Bienenstock, PA-C  Note - This record has been created using Dragon software.  Chart creation errors have been sought, but may not always  have been located. Such creation errors do not reflect on  the standard of medical care.,

## 2022-10-24 ENCOUNTER — Ambulatory Visit: Payer: Medicare Other | Attending: Physician Assistant | Admitting: Physician Assistant

## 2022-10-24 ENCOUNTER — Encounter: Payer: Self-pay | Admitting: Physician Assistant

## 2022-10-24 VITALS — BP 116/74 | HR 66 | Resp 16 | Ht 65.5 in | Wt 245.0 lb

## 2022-10-24 DIAGNOSIS — Z9884 Bariatric surgery status: Secondary | ICD-10-CM

## 2022-10-24 DIAGNOSIS — L409 Psoriasis, unspecified: Secondary | ICD-10-CM

## 2022-10-24 DIAGNOSIS — Z79899 Other long term (current) drug therapy: Secondary | ICD-10-CM | POA: Diagnosis not present

## 2022-10-24 DIAGNOSIS — Z111 Encounter for screening for respiratory tuberculosis: Secondary | ICD-10-CM | POA: Diagnosis present

## 2022-10-24 DIAGNOSIS — M65322 Trigger finger, left index finger: Secondary | ICD-10-CM | POA: Diagnosis not present

## 2022-10-24 DIAGNOSIS — Z96652 Presence of left artificial knee joint: Secondary | ICD-10-CM | POA: Diagnosis present

## 2022-10-24 DIAGNOSIS — M65332 Trigger finger, left middle finger: Secondary | ICD-10-CM | POA: Diagnosis present

## 2022-10-24 DIAGNOSIS — M1711 Unilateral primary osteoarthritis, right knee: Secondary | ICD-10-CM

## 2022-10-24 DIAGNOSIS — M47816 Spondylosis without myelopathy or radiculopathy, lumbar region: Secondary | ICD-10-CM

## 2022-10-24 DIAGNOSIS — E559 Vitamin D deficiency, unspecified: Secondary | ICD-10-CM | POA: Diagnosis present

## 2022-10-24 DIAGNOSIS — Z8719 Personal history of other diseases of the digestive system: Secondary | ICD-10-CM

## 2022-10-24 DIAGNOSIS — M19041 Primary osteoarthritis, right hand: Secondary | ICD-10-CM | POA: Diagnosis present

## 2022-10-24 DIAGNOSIS — Z8659 Personal history of other mental and behavioral disorders: Secondary | ICD-10-CM | POA: Diagnosis present

## 2022-10-24 DIAGNOSIS — M19042 Primary osteoarthritis, left hand: Secondary | ICD-10-CM | POA: Diagnosis present

## 2022-10-24 DIAGNOSIS — L4050 Arthropathic psoriasis, unspecified: Secondary | ICD-10-CM

## 2022-10-24 NOTE — Progress Notes (Signed)
CBC WNL

## 2022-10-24 NOTE — Patient Instructions (Signed)
Standing Labs We placed an order today for your standing lab work.   Please have your standing labs drawn in September and every 3 months   Please have your labs drawn 2 weeks prior to your appointment so that the provider can discuss your lab results at your appointment, if possible.  Please note that you may see your imaging and lab results in MyChart before we have reviewed them. We will contact you once all results are reviewed. Please allow our office up to 72 hours to thoroughly review all of the results before contacting the office for clarification of your results.  WALK-IN LAB HOURS  Monday through Thursday from 8:00 am -12:30 pm and 1:00 pm-5:00 pm and Friday from 8:00 am-12:00 pm.  Patients with office visits requiring labs will be seen before walk-in labs.  You may encounter longer than normal wait times. Please allow additional time. Wait times may be shorter on  Monday and Thursday afternoons.  We do not book appointments for walk-in labs. We appreciate your patience and understanding with our staff.   Labs are drawn by [facility]. Please bring your co-pay at the time of your lab draw.  You may receive a bill from [facility] for your lab work.  Please note if you are on Hydroxychloroquine and and an order has been placed for a Hydroxychloroquine level,  you will need to have it drawn 4 hours or more after your last dose.  If you wish to have your labs drawn at another location, please call the office 24 hours in advance so we can fax the orders.  The office is located at [address], [address], [place], [place] [address]   If you have any questions regarding directions or hours of operation,  please call [phone].   As a reminder, please drink plenty of water prior to coming for your lab work. Thanks!  

## 2022-10-25 NOTE — Progress Notes (Signed)
CMP WNL

## 2022-10-27 LAB — COMPLETE METABOLIC PANEL WITH GFR
AG Ratio: 1.5 (calc) (ref 1.0–2.5)
ALT: 18 U/L (ref 6–29)
AST: 17 U/L (ref 10–35)
Albumin: 4.2 g/dL (ref 3.6–5.1)
Alkaline phosphatase (APISO): 89 U/L (ref 37–153)
BUN: 13 mg/dL (ref 7–25)
CO2: 26 mmol/L (ref 20–32)
Calcium: 9.4 mg/dL (ref 8.6–10.4)
Chloride: 105 mmol/L (ref 98–110)
Creat: 0.68 mg/dL (ref 0.50–1.05)
Globulin: 2.8 g/dL (calc) (ref 1.9–3.7)
Glucose, Bld: 103 mg/dL — ABNORMAL HIGH (ref 65–99)
Potassium: 4.5 mmol/L (ref 3.5–5.3)
Sodium: 141 mmol/L (ref 135–146)
Total Bilirubin: 0.2 mg/dL (ref 0.2–1.2)
Total Protein: 7 g/dL (ref 6.1–8.1)
eGFR: 95 mL/min/{1.73_m2} (ref >=60)

## 2022-10-27 LAB — QUANTIFERON-TB GOLD PLUS
Mitogen-NIL: >10 IU/mL
NIL: 0.07 IU/mL
QuantiFERON-TB Gold Plus: NEGATIVE
TB1-NIL: <0 IU/mL
TB2-NIL: 0 IU/mL

## 2022-10-27 LAB — CBC WITH DIFFERENTIAL/PLATELET
Absolute Monocytes: 691 cells/uL (ref 200–950)
Basophils Absolute: 108 cells/uL (ref 0–200)
Basophils Relative: 1.5 %
Eosinophils Absolute: 418 cells/uL (ref 15–500)
Eosinophils Relative: 5.8 %
HCT: 40.7 % (ref 35.0–45.0)
Hemoglobin: 13.4 g/dL (ref 11.7–15.5)
Lymphs Abs: 2167 cells/uL (ref 850–3900)
MCH: 27.9 pg (ref 27.0–33.0)
MCHC: 32.9 g/dL (ref 32.0–36.0)
MCV: 84.8 fL (ref 80.0–100.0)
MPV: 11.2 fL (ref 7.5–12.5)
Monocytes Relative: 9.6 %
Neutro Abs: 3816 cells/uL (ref 1500–7800)
Neutrophils Relative %: 53 %
Platelets: 338 10*3/uL (ref 140–400)
RBC: 4.8 10*6/uL (ref 3.80–5.10)
RDW: 13 % (ref 11.0–15.0)
Total Lymphocyte: 30.1 %
WBC: 7.2 10*3/uL (ref 3.8–10.8)

## 2022-10-28 NOTE — Progress Notes (Signed)
TB gold negative

## 2022-11-01 ENCOUNTER — Other Ambulatory Visit: Payer: Self-pay | Admitting: Physician Assistant

## 2022-11-01 DIAGNOSIS — L409 Psoriasis, unspecified: Secondary | ICD-10-CM

## 2022-11-01 DIAGNOSIS — L4050 Arthropathic psoriasis, unspecified: Secondary | ICD-10-CM

## 2022-11-01 NOTE — Telephone Encounter (Signed)
Last Fill: 08/08/2022  Labs: 10/24/2022 CMP WNL CBC WNL.   TB Gold: 10/24/2022 Neg    Next Visit: 04/04/2023  Last Visit: 10/24/2022   DX: Psoriatic arthropathy   Current Dose per office note 10/24/2022: Cosentyx 300 mg subcutaneous injections every 28 days   Okay to refill Cosentyx?

## 2023-01-12 ENCOUNTER — Other Ambulatory Visit: Payer: Self-pay | Admitting: Rheumatology

## 2023-01-12 DIAGNOSIS — L4050 Arthropathic psoriasis, unspecified: Secondary | ICD-10-CM

## 2023-01-12 DIAGNOSIS — L409 Psoriasis, unspecified: Secondary | ICD-10-CM

## 2023-01-13 NOTE — Telephone Encounter (Signed)
Last Fill: 11/01/2022   Labs: 10/24/2022  CMP WNL  CBC WNL   TB Gold: 10/24/2022  TB gold negative   Next Visit: 04/04/2023   Last Visit: 10/24/2022   UE:[id number] arthropathy   Current Dose per office note 10/24/2022: Cosentyx 300 mg subcutaneous injections every 28 days   Okay to refill Cosentyx?

## 2023-03-21 NOTE — Progress Notes (Deleted)
Office Visit Note  Patient: Cassidy Reeves             Date of Birth: [birth year]           MRN: [id number]             PCP: Gaspar Garbe, MD Referring: Gaspar Garbe, MD Visit Date: 04/04/2023 Occupation: @GUAROCC @  Subjective:  No chief complaint on file.   History of Present Illness: Cassidy Reeves is a 67 y.o. female ***     Activities of Daily Living:  Patient reports morning stiffness for *** {minute/hour:19697}.   Patient {ACTIONS;DENIES/REPORTS:21021675::"Denies"} nocturnal pain.  Difficulty dressing/grooming: {ACTIONS;DENIES/REPORTS:21021675::"Denies"} Difficulty climbing stairs: {ACTIONS;DENIES/REPORTS:21021675::"Denies"} Difficulty getting out of chair: {ACTIONS;DENIES/REPORTS:21021675::"Denies"} Difficulty using hands for taps, buttons, cutlery, and/or writing: {ACTIONS;DENIES/REPORTS:21021675::"Denies"}  No Rheumatology ROS completed.   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    Past Medical History:  Diagnosis Date   Anxiety    Arthritis    GERD (gastroesophageal reflux disease)    History of kidney stones    Pelvic mass    RA (rheumatoid arthritis) (HCC)    UTI (urinary tract infection) 11/09/2018    Family History  Problem Relation Age of Onset   Heart disease Father    Arthritis Father    Psoriasis Father    Parkinsonism Father    Other Father 60       pacemaker   Kidney disease Maternal Uncle    Stroke Maternal Grandmother    Heart failure Paternal  Grandmother    Colon cancer Paternal Grandfather    Esophageal cancer Neg Hx    Rectal cancer Neg Hx    Stomach cancer Neg Hx    Past Surgical History:  Procedure Laterality Date   ABDOMINAL HYSTERECTOMY     APPENDECTOMY     BACK SURGERY     CHOLECYSTECTOMY     EXPLORATORY LAPAROTOMY     GASTRIC BYPASS     KNEE ARTHROSCOPY     LITHOTRIPSY     REPLACEMENT TOTAL KNEE Left 12/26/2020   Social History   Social History Narrative   Daily caffeine    Immunization History  Administered Date(s) Administered   PFIZER(Purple Top)SARS-COV-2 Vaccination 07/27/2019, 08/17/2019, 02/02/2020     Objective: Vital Signs: There were no vitals taken for this visit.   Physical Exam   Musculoskeletal Exam: ***  CDAI Exam: CDAI Score: -- Patient Global: --; Provider Global: -- Swollen: --; Tender: -- Joint Exam 04/04/2023   No joint exam has been documented for this visit   There is currently no information documented on the homunculus. Go to the Rheumatology activity and complete the homunculus joint exam.  Investigation: No additional findings.  Imaging: No results found.  Recent Labs: Lab Results  Component Value Date   WBC 7.2 10/24/2022   HGB 13.4 10/24/2022   PLT 338 10/24/2022   NA 141 10/24/2022   K 4.5 10/24/2022   CL 105 10/24/2022   CO2 26 10/24/2022   GLUCOSE 103 (H) 10/24/2022  BUN 13 10/24/2022   CREATININE 0.68 10/24/2022   BILITOT 0.2 10/24/2022   ALKPHOS 94 11/09/2018   AST 17 10/24/2022   ALT 18 10/24/2022   PROT 7.0 10/24/2022   ALBUMIN 3.0 (L) 11/09/2018   CALCIUM 9.4 10/24/2022   GFRAA 95 06/26/2020   QFTBGOLDPLUS NEGATIVE 10/24/2022    Speciality Comments: No specialty comments available.  Procedures:  No procedures performed Allergies: Dilaudid [hydromorphone hcl], Penicillins, Morphine sulfate, Penicillin v potassium, and Sulfamethoxazole-trimethoprim   Assessment / Plan:     Visit Diagnoses: No diagnosis found.  Orders: No orders  of the defined types were placed in this encounter.  No orders of the defined types were placed in this encounter.   Face-to-face time spent with patient was *** minutes. Greater than 50% of time was spent in counseling and coordination of care.  Follow-Up Instructions: No follow-ups on file.   Ellen Henri, CMA  Note - This record has been created using Animal nutritionist.  Chart creation errors have been sought, but may not always  have been located. Such creation errors do not reflect on  the standard of medical care.

## 2023-04-04 ENCOUNTER — Ambulatory Visit: Payer: Medicare Other | Admitting: Rheumatology

## 2023-04-04 DIAGNOSIS — Z79899 Other long term (current) drug therapy: Secondary | ICD-10-CM

## 2023-04-04 DIAGNOSIS — Z8719 Personal history of other diseases of the digestive system: Secondary | ICD-10-CM

## 2023-04-04 DIAGNOSIS — Z96652 Presence of left artificial knee joint: Secondary | ICD-10-CM

## 2023-04-04 DIAGNOSIS — M1711 Unilateral primary osteoarthritis, right knee: Secondary | ICD-10-CM

## 2023-04-04 DIAGNOSIS — M65332 Trigger finger, left middle finger: Secondary | ICD-10-CM

## 2023-04-04 DIAGNOSIS — L4050 Arthropathic psoriasis, unspecified: Secondary | ICD-10-CM

## 2023-04-04 DIAGNOSIS — Z9884 Bariatric surgery status: Secondary | ICD-10-CM

## 2023-04-04 DIAGNOSIS — E559 Vitamin D deficiency, unspecified: Secondary | ICD-10-CM

## 2023-04-04 DIAGNOSIS — M65322 Trigger finger, left index finger: Secondary | ICD-10-CM

## 2023-04-04 DIAGNOSIS — L409 Psoriasis, unspecified: Secondary | ICD-10-CM

## 2023-04-04 DIAGNOSIS — M19042 Primary osteoarthritis, left hand: Secondary | ICD-10-CM

## 2023-04-04 DIAGNOSIS — Z8659 Personal history of other mental and behavioral disorders: Secondary | ICD-10-CM

## 2023-04-04 DIAGNOSIS — M47816 Spondylosis without myelopathy or radiculopathy, lumbar region: Secondary | ICD-10-CM

## 2023-04-05 ENCOUNTER — Other Ambulatory Visit: Payer: Self-pay | Admitting: Physician Assistant

## 2023-04-05 DIAGNOSIS — L4050 Arthropathic psoriasis, unspecified: Secondary | ICD-10-CM

## 2023-04-05 DIAGNOSIS — L409 Psoriasis, unspecified: Secondary | ICD-10-CM

## 2023-04-07 NOTE — Telephone Encounter (Signed)
Last Fill: 01/13/2023  Labs: 10/24/2022 CBC WNL. CMP WNL   TB Gold: 10/24/2022 Neg    Next Visit: 04/24/2023  Last Visit: 10/24/2022  RJ:[id number] arthropathy   Current Dose per office note 10/24/2022: Cosentyx 300 mg subcutaneous injections every 28 days   Attempted to contact the patient and left message to advise patient she is due to update labs.   Okay to refill Cosentyx?

## 2023-04-14 NOTE — Progress Notes (Signed)
Office Visit Note  Patient: Cassidy Reeves             Date of Birth: Jan 10, 1956           MRN: [id number]             PCP: Gaspar Garbe, MD Referring: Gaspar Garbe, MD Visit Date: 04/24/2023 Occupation: @GUAROCC @  Subjective:  Medication management  History of Present Illness: Cassidy Reeves is a 67 y.o. female with psoriatic arthritis, psoriasis and osteoarthritis.  She denies having a flare of psoriasis or psoriatic arthritis.  She denies any episodes of Achilles tendinitis, plantar fasciitis or uveitis.  She continues to have some pain and stiffness in her hands.  She states she has been doing some of the stretching exercises.  She has a recurrence of left index finger triggering which is not symptomatic at this point.  She had no interruption in Cosentyx 300 mg subcutaneous every 28 days since her last visit.  She has been exercising on a regular basis.    Activities of Daily Living:  Patient reports morning stiffness for 60-90 minutes.   Patient Reports nocturnal pain.  Difficulty dressing/grooming: Denies Difficulty climbing stairs: Denies Difficulty getting out of chair: Denies Difficulty using hands for taps, buttons, cutlery, and/or writing: Reports  Review of Systems  Constitutional:  Positive for fatigue.  HENT:  Negative for mouth sores and mouth dryness.   Eyes:  Negative for dryness.  Respiratory:  Negative for shortness of breath.   Cardiovascular:  Negative for chest pain and palpitations.  Gastrointestinal:  Negative for blood in stool, constipation and diarrhea.  Endocrine: Negative for increased urination.  Genitourinary:  Negative for involuntary urination.  Musculoskeletal:  Positive for joint pain, joint pain, joint swelling, myalgias, muscle weakness, morning stiffness, muscle tenderness and myalgias. Negative for gait problem.  Skin:  Negative for color change, rash, hair loss and sensitivity to sunlight.  Allergic/Immunologic: Negative for  susceptible to infections.  Neurological:  Negative for dizziness and headaches.  Hematological:  Negative for swollen glands.  Psychiatric/Behavioral:  Negative for depressed mood and sleep disturbance. The patient is not nervous/anxious.     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    Past Medical History:  Diagnosis Date   Anxiety    Arthritis    GERD (gastroesophageal reflux disease)    History of kidney stones    Pelvic mass    RA (rheumatoid arthritis) (HCC)    UTI (urinary tract infection) 11/09/2018    Family History  Problem Relation Age of Onset   Heart disease Father    Arthritis Father    Psoriasis Father    Parkinsonism Father    Other Father 56       pacemaker   Kidney disease Maternal Uncle    Stroke Maternal Grandmother    Heart failure Paternal Grandmother    Colon cancer Paternal Grandfather    Esophageal cancer Neg Hx    Rectal cancer Neg Hx    Stomach cancer  Neg Hx    Past Surgical History:  Procedure Laterality Date   ABDOMINAL HYSTERECTOMY     APPENDECTOMY     BACK SURGERY     CHOLECYSTECTOMY     EXPLORATORY LAPAROTOMY     GASTRIC BYPASS     KNEE ARTHROSCOPY     LITHOTRIPSY     REPLACEMENT TOTAL KNEE Left 12/26/2020   Social History   Social History Narrative   Daily caffeine    Immunization History  Administered Date(s) Administered   PFIZER(Purple Top)SARS-COV-2 Vaccination 07/27/2019, 08/17/2019, 02/02/2020     Objective: Vital Signs: BP 132/79 (BP  Location: Left Arm, Patient Position: Sitting, Cuff Size: Large)   Pulse 77   Resp 12   Ht 5\' 5"  (1.651 m)   Wt 249 lb (112.9 kg)   BMI 41.44 kg/m    Physical Exam Vitals and nursing note reviewed.  Constitutional:      Appearance: She is well-developed.  HENT:     Head: Normocephalic and atraumatic.  Eyes:     Conjunctiva/sclera: Conjunctivae normal.  Cardiovascular:     Rate and Rhythm: Normal rate and regular rhythm.     Heart sounds: Normal heart sounds.  Pulmonary:     Effort: Pulmonary effort is normal.     Breath sounds: Normal breath sounds.  Abdominal:     General: Bowel sounds are normal.     Palpations: Abdomen is soft.  Musculoskeletal:     Cervical back: Normal range of motion.  Lymphadenopathy:     Cervical: No cervical adenopathy.  Skin:    General: Skin is warm and dry.     Capillary Refill: Capillary refill takes less than 2 seconds.  Neurological:     Mental Status: She is alert and oriented to person, place, and time.  Psychiatric:        Behavior: Behavior normal.      Musculoskeletal Exam: Cervical, thoracic and lumbar spine were in good range of motion.  She had no SI joint tenderness.  Shoulder joints, elbow joints, wrist joints, MCPs, PIPs and DIPs been good range of motion.  Bilateral PIP and DIP thickening with no synovitis was noted.  Bilateral CMC thickening was noted.  She had thickening of the left index flexor tendon without tenderness.  Hip joints were in good range of motion.  Left knee joint was replaced and which was in good range of motion.  Right knee joint was in good range of motion without any warmth swelling or effusion.  There was no tenderness over ankles or MTPs.  CDAI Exam: CDAI Score: -- Patient Global: --; Provider Global: -- Swollen: --; Tender: -- Joint Exam 04/24/2023   No joint exam has been documented for this visit   There is currently no information documented on the homunculus. Go to the Rheumatology activity and  complete the homunculus joint exam.  Investigation: No additional findings.  Imaging: No results found.  Recent Labs: Lab Results  Component Value Date   WBC 7.2 10/24/2022   HGB 13.4 10/24/2022   PLT 338 10/24/2022   NA 141 10/24/2022   K 4.5 10/24/2022   CL 105 10/24/2022   CO2 26 10/24/2022   GLUCOSE 103 (H) 10/24/2022   BUN 13 10/24/2022   CREATININE 0.68 10/24/2022   BILITOT 0.2 10/24/2022   ALKPHOS 94 11/09/2018   AST 17 10/24/2022   ALT 18 10/24/2022   PROT 7.0 10/24/2022   ALBUMIN 3.0 (L) 11/09/2018   CALCIUM 9.4 10/24/2022   GFRAA 95  06/26/2020   QFTBGOLDPLUS NEGATIVE 10/24/2022    Speciality Comments: No specialty comments available.  Procedures:  No procedures performed Allergies: Dilaudid [hydromorphone hcl], Penicillins, Morphine sulfate, Penicillin v potassium, and Sulfamethoxazole-trimethoprim   Assessment / Plan:     Visit Diagnoses: Psoriatic arthropathy (HCC)-patient denies having a flare of psoriatic arthritis.  She denies any recent episodes of joint swelling, dactylitis, plantar fasciitis or uveitis.  She denies any interruption in Cosentyx therapy.  Psoriasis-she had no active psoriasis lesions.  She denies any psoriasis flares.   High risk medication use - Cosentyx 300 mg sq injections every 28 days.Labs obtained on October 24, 2022 CBC and CMP were normal.  TB Gold was negative.  Will check labs today and then every 3 months.  Information about immunization was placed in the AVS.  She was advised to hold Cosyntex if she develops an infection resume after the infection resolves.  Trigger finger, left middle finger-she has intermittent triggering.  Patient wants to hold off on cortisone injection at this point.  Trigger index finger of left hand - Resolved.  Ultrasound-guided injection performed on 07/18/2022 has alleviated her symptoms.  Primary osteoarthritis of both hands-bilateral PIP and DIP thickening was noted.  Patient has been doing some  stretching exercises.  Primary osteoarthritis of right knee-no warmth swelling or effusion was noted.  S/P TKR (total knee replacement), left - Performed on 12/26/2020 by Dr. Despina Hick.  She good range of motion without discomfort.  DDD lumbar spine-she has intermittent discomfort.  She has discomfort with prolonged walking.  She denies any discomfort today.  Patient requested a handicap placard form to be filled which was given to the patient.  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     Follow-Up Instructions: Return in about 5 months (around 09/22/2023) for Psoriatic arthritis.   Pollyann Savoy, MD  Note - This record has been created using Animal nutritionist.  Chart creation errors have been sought, but may not always  have been located. Such creation errors do not reflect on  the standard of medical care.

## 2023-04-24 ENCOUNTER — Telehealth: Payer: Self-pay | Admitting: Pharmacist

## 2023-04-24 ENCOUNTER — Ambulatory Visit: Payer: Medicare Other | Attending: Rheumatology | Admitting: Rheumatology

## 2023-04-24 ENCOUNTER — Encounter: Payer: Self-pay | Admitting: Rheumatology

## 2023-04-24 VITALS — BP 132/79 | HR 77 | Resp 12 | Ht 65.0 in | Wt 249.0 lb

## 2023-04-24 DIAGNOSIS — M19041 Primary osteoarthritis, right hand: Secondary | ICD-10-CM | POA: Insufficient documentation

## 2023-04-24 DIAGNOSIS — M65322 Trigger finger, left index finger: Secondary | ICD-10-CM | POA: Insufficient documentation

## 2023-04-24 DIAGNOSIS — M65332 Trigger finger, left middle finger: Secondary | ICD-10-CM | POA: Insufficient documentation

## 2023-04-24 DIAGNOSIS — L409 Psoriasis, unspecified: Secondary | ICD-10-CM | POA: Diagnosis not present

## 2023-04-24 DIAGNOSIS — M1711 Unilateral primary osteoarthritis, right knee: Secondary | ICD-10-CM | POA: Diagnosis present

## 2023-04-24 DIAGNOSIS — M47816 Spondylosis without myelopathy or radiculopathy, lumbar region: Secondary | ICD-10-CM | POA: Insufficient documentation

## 2023-04-24 DIAGNOSIS — Z8659 Personal history of other mental and behavioral disorders: Secondary | ICD-10-CM | POA: Insufficient documentation

## 2023-04-24 DIAGNOSIS — Z8719 Personal history of other diseases of the digestive system: Secondary | ICD-10-CM | POA: Diagnosis present

## 2023-04-24 DIAGNOSIS — Z96652 Presence of left artificial knee joint: Secondary | ICD-10-CM | POA: Insufficient documentation

## 2023-04-24 DIAGNOSIS — Z79899 Other long term (current) drug therapy: Secondary | ICD-10-CM | POA: Insufficient documentation

## 2023-04-24 DIAGNOSIS — Z9884 Bariatric surgery status: Secondary | ICD-10-CM | POA: Diagnosis present

## 2023-04-24 DIAGNOSIS — L4050 Arthropathic psoriasis, unspecified: Secondary | ICD-10-CM | POA: Diagnosis present

## 2023-04-24 DIAGNOSIS — E559 Vitamin D deficiency, unspecified: Secondary | ICD-10-CM | POA: Diagnosis present

## 2023-04-24 DIAGNOSIS — M19042 Primary osteoarthritis, left hand: Secondary | ICD-10-CM | POA: Insufficient documentation

## 2023-04-24 NOTE — Telephone Encounter (Signed)
Submitted a Prior Authorization RENEWAL request to [facility] for COSENTYX SQ via CoverMyMeds. Will update once we receive a response.  Key: [id number]   Per automated response: Authorization already on file for this request. Authorization starting on 02/18/2023 and ending on 05/19/2024.  Chesley Mires, PharmD, MPH, BCPS, CPP Clinical Pharmacist (Rheumatology and Pulmonology)

## 2023-04-24 NOTE — Patient Instructions (Signed)
Standing Labs We placed an order today for your standing lab work.   Please have your standing labs drawn in March and every 3 months  Please have your labs drawn 2 weeks prior to your appointment so that the provider can discuss your lab results at your appointment, if possible.  Please note that you may see your imaging and lab results in [place] before we have reviewed them. We will contact you once all results are reviewed. Please allow our office up to 72 hours to thoroughly review all of the results before contacting the office for clarification of your results.  WALK-IN LAB HOURS  Monday through Thursday from 8:00 am -12:30 pm and 1:00 pm-5:00 pm and Friday from 8:00 am-12:00 pm.  Patients with office visits requiring labs will be seen before walk-in labs.  You may encounter longer than normal wait times. Please allow additional time. Wait times may be shorter on  Monday and Thursday afternoons.  We do not book appointments for walk-in labs. We appreciate your patience and understanding with our staff.   Labs are drawn by [facility]. Please bring your co-pay at the time of your lab draw.  You may receive a bill from [facility] for your lab work.  Please note if you are on Hydroxychloroquine and and an order has been placed for a Hydroxychloroquine level,  you will need to have it drawn 4 hours or more after your last dose.  If you wish to have your labs drawn at another location, please call the office 24 hours in advance so we can fax the orders.  The office is located at [address], [place], [place], [place] [address]   If you have any questions regarding directions or hours of operation,  please call [phone].   As a reminder, please drink plenty of water prior to coming for your lab work. Thanks!   Vaccines You are taking a medication(s) that can suppress your immune system.  The following immunizations are recommended: Flu annually Covid-19  RSV Td/Tdap (tetanus,  diphtheria, pertussis) every 10 years Pneumonia (Prevnar 15 then Pneumovax 23 at least 1 year apart.  Alternatively, can take Prevnar 20 without needing additional dose) Shingrix: 2 doses from 4 weeks to 6 months apart  Please check with your PCP to make sure you are up to date.   If you have signs or symptoms of an infection or start antibiotics: First, call your PCP for workup of your infection. Hold your medication through the infection, until you complete your antibiotics, and until symptoms resolve if you take the following: Injectable medication (Actemra, Benlysta, Cimzia, Cosentyx, Enbrel, Humira, Kevzara, Orencia, Remicade, Simponi, Stelara, Taltz, Tremfya) Methotrexate Leflunomide (Arava) Mycophenolate (Cellcept) Morrie Sheldon, Olumiant, or Rinvoq

## 2023-04-25 LAB — CBC WITH DIFFERENTIAL/PLATELET
Absolute Lymphocytes: 2536 {cells}/uL (ref 850–3900)
Absolute Monocytes: 840 {cells}/uL (ref 200–950)
Basophils Absolute: 104 {cells}/uL (ref 0–200)
Basophils Relative: 1.3 %
Eosinophils Absolute: 272 {cells}/uL (ref 15–500)
Eosinophils Relative: 3.4 %
HCT: 41.9 % (ref 35.0–45.0)
Hemoglobin: 13.3 g/dL (ref 11.7–15.5)
MCH: 26.8 pg — ABNORMAL LOW (ref 27.0–33.0)
MCHC: 31.7 g/dL — ABNORMAL LOW (ref 32.0–36.0)
MCV: 84.3 fL (ref 80.0–100.0)
MPV: 11 fL (ref 7.5–12.5)
Monocytes Relative: 10.5 %
Neutro Abs: 4248 {cells}/uL (ref 1500–7800)
Neutrophils Relative %: 53.1 %
Platelets: 342 10*3/uL (ref 140–400)
RBC: 4.97 10*6/uL (ref 3.80–5.10)
RDW: 12.8 % (ref 11.0–15.0)
Total Lymphocyte: 31.7 %
WBC: 8 10*3/uL (ref 3.8–10.8)

## 2023-04-25 LAB — COMPLETE METABOLIC PANEL WITH GFR
AG Ratio: 1.3 (calc) (ref 1.0–2.5)
ALT: 20 U/L (ref 6–29)
AST: 20 U/L (ref 10–35)
Albumin: 4.1 g/dL (ref 3.6–5.1)
Alkaline phosphatase (APISO): 94 U/L (ref 37–153)
BUN: 16 mg/dL (ref 7–25)
CO2: 29 mmol/L (ref 20–32)
Calcium: 9.5 mg/dL (ref 8.6–10.4)
Chloride: 104 mmol/L (ref 98–110)
Creat: 0.75 mg/dL (ref 0.50–1.05)
Globulin: 3.1 g/dL (ref 1.9–3.7)
Glucose, Bld: 87 mg/dL (ref 65–99)
Potassium: 4.5 mmol/L (ref 3.5–5.3)
Sodium: 142 mmol/L (ref 135–146)
Total Bilirubin: 0.3 mg/dL (ref 0.2–1.2)
Total Protein: 7.2 g/dL (ref 6.1–8.1)
eGFR: 87 mL/min/{1.73_m2} (ref >=60)

## 2023-04-25 NOTE — Progress Notes (Signed)
CBC and CMP are normal.

## 2023-05-06 ENCOUNTER — Other Ambulatory Visit: Payer: Self-pay | Admitting: *Deleted

## 2023-05-06 MED ORDER — PREDNISONE 5 MG PO TABS: ORAL_TABLET | ORAL | 0 refills | Status: AC

## 2023-05-06 NOTE — Addendum Note (Signed)
Addended by: Henriette Combs on: 05/06/2023 01:31 PM   Modules accepted: Orders

## 2023-05-06 NOTE — Telephone Encounter (Signed)
We would not be able to give a cortisone injection if she has involvement of both hands.  She may benefit from a prednisone taper.  Please send a prescription for prednisone starting at 20 mg and taper by 5 mg every 4 days.  Side effects of prednisone include elevation of blood pressure, elevation of blood sugar, weight gain, cataracts, increased risk of heart disease and osteoporosis.

## 2023-05-06 NOTE — Telephone Encounter (Signed)
Patient advised we would not be able to give a cortisone injection if she has involvement of both hands.  Patient advised she may benefit from a prednisone taper.  Prescription sent in for prednisone starting at 20 mg and taper by 5 mg every 4 days.  Side effects of prednisone include elevation of blood pressure, elevation of blood sugar, weight gain, cataracts, increased risk of heart disease and osteoporosis.

## 2023-05-06 NOTE — Telephone Encounter (Signed)
Patient contacted the office stating her hands have flared up over the weekend. Patient states both hands are sore and have some swelling. Patient states she is having constant pain. Patient states she is on Cosentyx and has not missed any doses. Patient states her next injection is not due until next week. Patient would like to know if you think a cortison injection would be beneficial. Please advise.

## 2023-05-08 ENCOUNTER — Other Ambulatory Visit: Payer: Self-pay | Admitting: Physician Assistant

## 2023-05-08 DIAGNOSIS — L4050 Arthropathic psoriasis, unspecified: Secondary | ICD-10-CM

## 2023-05-08 DIAGNOSIS — L409 Psoriasis, unspecified: Secondary | ICD-10-CM

## 2023-05-08 NOTE — Telephone Encounter (Signed)
Last Fill: 04/07/2023 (30 day supply)  Labs: 04/24/2023 CBC and CMP are normal.   TB Gold: 10/24/2022 Neg    Next Visit: 09/29/2023  Last Visit: 04/24/2023  PI:RJJOACZYS arthropathy   Current Dose per office note 04/24/2023: Cosentyx 300 mg sq injections every 28 days.   Okay to refill Cosentyx?

## 2023-06-20 ENCOUNTER — Telehealth: Payer: Self-pay | Admitting: Pharmacist

## 2023-06-20 NOTE — Telephone Encounter (Signed)
Submitted a Prior Authorization RENEWAL request to [facility] for COSENTYX SQ via CoverMyMeds. Will update once we receive a response.  Key: [id number]

## 2023-07-02 NOTE — Telephone Encounter (Signed)
Jackie from [facility] contacted the office in regards to the patient's Cosentyx. Annice Pih states the patient needs a PA done for the Cosentyx. Gertie Gowda a PA was submitted on 06/20/2023. Annice Pih states that PA shows there is no response to the medical questions on their side. Annice Pih states the PA could have been part of a system issue that happened with CoverMyMeds in January. Annice Pih states they need a new PA because the Cosentyx is no longer on the patient's formulary. Jackie's call back number is [phone]. Please advise.

## 2023-07-03 NOTE — Telephone Encounter (Signed)
PA form received. Completed and faxed with clinicals  EOC: [id number] Fax: [phone] Phone: [phone]   Chesley Mires, PharmD, MPH, BCPS, CPP Clinical Pharmacist (Rheumatology and Pulmonology)

## 2023-07-03 NOTE — Telephone Encounter (Signed)
Called [employer] for PA form to be faxed for Cosentyx SQ . Rep will refax form  Reference # [id number] Phone: [phone]

## 2023-07-21 NOTE — Telephone Encounter (Signed)
 Received a fax regarding Prior Authorization from [facility] for COSENTYX SQ. Authorization has been DENIED because must try and fail or have a clinical reason they cannot take Cassidy Reeves, [place].  Submitted an URGENT appeal to [facility] for COSENTYX SQ.  Phone: [phone] Fax: [phone]  Chesley Mires, PharmD, MPH, BCPS, CPP Clinical Pharmacist (Rheumatology and Pulmonology)

## 2023-07-22 NOTE — Telephone Encounter (Signed)
 Received notification from [facility] regarding a prior authorization for COSENTYX SQ. Denial has been OVERTURNED. Authorization has been APPROVED from 05/21/2023 to 05/19/2024. Approval letter sent to scan center.  Patient can continue to fill through [facility]: [phone]  Authorization # [id number]  Chesley Mires, PharmD, MPH, BCPS, CPP Clinical Pharmacist (Rheumatology and Pulmonology)

## 2023-07-25 ENCOUNTER — Other Ambulatory Visit: Payer: Self-pay | Admitting: *Deleted

## 2023-07-25 DIAGNOSIS — Z79899 Other long term (current) drug therapy: Secondary | ICD-10-CM

## 2023-07-25 DIAGNOSIS — Z131 Encounter for screening for diabetes mellitus: Secondary | ICD-10-CM

## 2023-07-26 LAB — CBC WITH DIFFERENTIAL/PLATELET
Absolute Lymphocytes: 2268 {cells}/uL (ref 850–3900)
Absolute Monocytes: 670 {cells}/uL (ref 200–950)
Basophils Absolute: 70 {cells}/uL (ref 0–200)
Basophils Relative: 1.3 %
Eosinophils Absolute: 259 {cells}/uL (ref 15–500)
Eosinophils Relative: 4.8 %
HCT: 41.3 % (ref 35.0–45.0)
Hemoglobin: 13.2 g/dL (ref 11.7–15.5)
MCH: 27 pg (ref 27.0–33.0)
MCHC: 32 g/dL (ref 32.0–36.0)
MCV: 84.5 fL (ref 80.0–100.0)
MPV: 11.2 fL (ref 7.5–12.5)
Monocytes Relative: 12.4 %
Neutro Abs: 2133 {cells}/uL (ref 1500–7800)
Neutrophils Relative %: 39.5 %
Platelets: 301 10*3/uL (ref 140–400)
RBC: 4.89 10*6/uL (ref 3.80–5.10)
RDW: 13.6 % (ref 11.0–15.0)
Total Lymphocyte: 42 %
WBC: 5.4 10*3/uL (ref 3.8–10.8)

## 2023-07-26 LAB — COMPLETE METABOLIC PANEL WITH GFR
AG Ratio: 1.5 (calc) (ref 1.0–2.5)
ALT: 24 U/L (ref 6–29)
AST: 22 U/L (ref 10–35)
Albumin: 4.3 g/dL (ref 3.6–5.1)
Alkaline phosphatase (APISO): 87 U/L (ref 37–153)
BUN: 12 mg/dL (ref 7–25)
CO2: 30 mmol/L (ref 20–32)
Calcium: 9.7 mg/dL (ref 8.6–10.4)
Chloride: 105 mmol/L (ref 98–110)
Creat: 0.68 mg/dL (ref 0.50–1.05)
Globulin: 2.9 g/dL (ref 1.9–3.7)
Glucose, Bld: 107 mg/dL — ABNORMAL HIGH (ref 65–99)
Potassium: 5.1 mmol/L (ref 3.5–5.3)
Sodium: 144 mmol/L (ref 135–146)
Total Bilirubin: 0.3 mg/dL (ref 0.2–1.2)
Total Protein: 7.2 g/dL (ref 6.1–8.1)
eGFR: 95 mL/min/{1.73_m2} (ref >=60)

## 2023-07-26 LAB — HEMOGLOBIN A1C
Hgb A1c MFr Bld: 5.8 %{Hb} — ABNORMAL HIGH (ref <5.7)
Mean Plasma Glucose: 120 mg/dL
eAG (mmol/L): 6.6 mmol/L

## 2023-07-27 NOTE — Progress Notes (Signed)
 Hemoglobin A1c is mildly elevated.  CMP normal except mildly elevated glucose probably not a fasting sample, CBC normal

## 2023-08-09 ENCOUNTER — Other Ambulatory Visit: Payer: Self-pay | Admitting: Physician Assistant

## 2023-08-09 DIAGNOSIS — L409 Psoriasis, unspecified: Secondary | ICD-10-CM

## 2023-08-09 DIAGNOSIS — L4050 Arthropathic psoriasis, unspecified: Secondary | ICD-10-CM

## 2023-08-11 NOTE — Telephone Encounter (Signed)
 Last Fill: 05/08/2023  Labs: 07/25/2023 CMP normal except mildly elevated glucose probably not a fasting sample, CBC normal   TB Gold: 10/24/2022 Neg    Next Visit: 09/29/2023  Last Visit: 04/24/2023  DX: Psoriatic arthropathy   Current Dose per office note 04/24/2023: Cosentyx 300 mg sq injections every 28 days.   Okay to refill Cosentyx?

## 2023-09-15 NOTE — Progress Notes (Deleted)
 Office Visit Note  Patient: Cassidy Reeves             Date of Birth: 12-26-1955           MRN: [id number]             PCP: Suzzanne Estrin, MD Referring: Suzzanne Estrin, MD Visit Date: 09/29/2023 Occupation: @GUAROCC @  Subjective:    History of Present Illness: Wilberta Dorvil is a 68 y.o. female with history of psoriatic arthritis.  Patient remains on  Cosentyx  300 mg sq injections every 28 days   CBC and CMP updated on 07/25/23.  Her next lab work will be due in June and every 3 months.  TB gold negative on 10/24/22. Future order for TB gold placed today.  Discussed the importance of holding cosentyx  if she develops signs or symptoms of an infection and to resume once the infection has completely cleared.    Activities of Daily Living:  Patient reports morning stiffness for *** {minute/hour:19697}.   Patient {ACTIONS;DENIES/REPORTS:21021675::Denies} nocturnal pain.  Difficulty dressing/grooming: {ACTIONS;DENIES/REPORTS:21021675::Denies} Difficulty climbing stairs: {ACTIONS;DENIES/REPORTS:21021675::Denies} Difficulty getting out of chair: {ACTIONS;DENIES/REPORTS:21021675::Denies} Difficulty using hands for taps, buttons, cutlery, and/or writing: {ACTIONS;DENIES/REPORTS:21021675::Denies}  No Rheumatology ROS completed.   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    Past Medical History:  Diagnosis Date   Anxiety    Arthritis    GERD (gastroesophageal reflux disease)    History of kidney  stones    Pelvic mass    RA (rheumatoid arthritis) (HCC)    UTI (urinary tract infection) 11/09/2018    Family History  Problem Relation Age of Onset   Heart disease Father    Arthritis Father    Psoriasis Father    Parkinsonism Father    Other Father 61       pacemaker   Kidney disease Maternal Uncle    Stroke Maternal Grandmother    Heart failure Paternal Grandmother    Colon cancer Paternal Grandfather    Esophageal cancer Neg Hx    Rectal cancer Neg Hx    Stomach cancer Neg Hx    Past Surgical History:  Procedure Laterality Date   ABDOMINAL HYSTERECTOMY     APPENDECTOMY     BACK SURGERY     CHOLECYSTECTOMY     EXPLORATORY LAPAROTOMY     GASTRIC BYPASS     KNEE ARTHROSCOPY     LITHOTRIPSY     REPLACEMENT TOTAL KNEE Left 12/26/2020   Social History   Social History Narrative   Daily caffeine    Immunization History  Administered Date(s) Administered   PFIZER(Purple Top)SARS-COV-2 Vaccination 07/27/2019, 08/17/2019, 02/02/2020     Objective: Vital Signs: There were no vitals taken for this visit.   Physical Exam Vitals and nursing note reviewed.  Constitutional:      Appearance: She is well-developed.  HENT:     Head: Normocephalic and atraumatic.  Eyes:     Conjunctiva/sclera: Conjunctivae normal.  Cardiovascular:     Rate and Rhythm: Normal rate and  regular rhythm.     Heart sounds: Normal heart sounds.  Pulmonary:     Effort: Pulmonary effort is normal.     Breath sounds: Normal breath sounds.  Abdominal:     General: Bowel sounds are normal.     Palpations: Abdomen is soft.  Musculoskeletal:     Cervical back: Normal range of motion.  Lymphadenopathy:     Cervical: No cervical adenopathy.  Skin:    General: Skin is warm and dry.     Capillary Refill: Capillary refill takes less than 2 seconds.  Neurological:     Mental Status: She is alert and oriented to person, place, and time.  Psychiatric:        Behavior: Behavior normal.       Musculoskeletal Exam: ***  CDAI Exam: CDAI Score: -- Patient Global: --; Provider Global: -- Swollen: --; Tender: -- Joint Exam 09/29/2023   No joint exam has been documented for this visit   There is currently no information documented on the homunculus. Go to the Rheumatology activity and complete the homunculus joint exam.  Investigation: No additional findings.  Imaging: No results found.  Recent Labs: Lab Results  Component Value Date   WBC 5.4 07/25/2023   HGB 13.2 07/25/2023   PLT 301 07/25/2023   NA 144 07/25/2023   K 5.1 07/25/2023   CL 105 07/25/2023   CO2 30 07/25/2023   GLUCOSE 107 (H) 07/25/2023   BUN 12 07/25/2023   CREATININE 0.68 07/25/2023   BILITOT 0.3 07/25/2023   ALKPHOS 94 11/09/2018   AST 22 07/25/2023   ALT 24 07/25/2023   PROT 7.2 07/25/2023   ALBUMIN 3.0 (L) 11/09/2018   CALCIUM 9.7 07/25/2023   GFRAA 95 06/26/2020   QFTBGOLDPLUS NEGATIVE 10/24/2022    Speciality Comments: No specialty comments available.  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  No orders of the defined types were placed in this encounter.   Face-to-face time spent with patient was *** minutes. Greater than 50% of time was spent in counseling and coordination of care.  Follow-Up Instructions: No  follow-ups on file.   Romayne Clubs, PA-C  Note - This record has been created using Dragon software.  Chart creation errors have been sought, but may not always  have been located. Such creation errors do not reflect on  the standard of medical care.

## 2023-09-29 ENCOUNTER — Ambulatory Visit: Payer: Medicare Other | Admitting: Physician Assistant

## 2023-09-29 DIAGNOSIS — M65332 Trigger finger, left middle finger: Secondary | ICD-10-CM

## 2023-09-29 DIAGNOSIS — Z8719 Personal history of other diseases of the digestive system: Secondary | ICD-10-CM

## 2023-09-29 DIAGNOSIS — L4050 Arthropathic psoriasis, unspecified: Secondary | ICD-10-CM

## 2023-09-29 DIAGNOSIS — M47816 Spondylosis without myelopathy or radiculopathy, lumbar region: Secondary | ICD-10-CM

## 2023-09-29 DIAGNOSIS — Z9884 Bariatric surgery status: Secondary | ICD-10-CM

## 2023-09-29 DIAGNOSIS — Z96652 Presence of left artificial knee joint: Secondary | ICD-10-CM

## 2023-09-29 DIAGNOSIS — Z8659 Personal history of other mental and behavioral disorders: Secondary | ICD-10-CM

## 2023-09-29 DIAGNOSIS — Z79899 Other long term (current) drug therapy: Secondary | ICD-10-CM

## 2023-09-29 DIAGNOSIS — E559 Vitamin D deficiency, unspecified: Secondary | ICD-10-CM

## 2023-09-29 DIAGNOSIS — M65322 Trigger finger, left index finger: Secondary | ICD-10-CM

## 2023-09-29 DIAGNOSIS — M1711 Unilateral primary osteoarthritis, right knee: Secondary | ICD-10-CM

## 2023-09-29 DIAGNOSIS — L409 Psoriasis, unspecified: Secondary | ICD-10-CM

## 2023-09-29 DIAGNOSIS — M19041 Primary osteoarthritis, right hand: Secondary | ICD-10-CM

## 2023-10-01 NOTE — Progress Notes (Signed)
 Office Visit Note  Patient: Cassidy Reeves             Date of Birth: 1955/08/17           MRN: [id number]             PCP: Suzzanne Estrin, MD Referring: Suzzanne Estrin, MD Visit Date: 10/15/2023 Occupation: @GUAROCC @  Subjective:  Pain in both thumbs   History of Present Illness: Jacquelyne Quarry is a 68 y.o. female with history of psoriatic arthritis.  Patient remains on  Cosentyx  300 mg sq injections every 28 days.  She is tolerating Cosentyx  without any side effects and has not had any recent gaps in therapy.  Patient reports about 1 week prior to her Cosentyx  injections she notices increased arthralgias but denies any joint swelling.  Patient states that she also has increased brain fog and fatigue 1 week prior cosentyx .  She denies any Achilles tendinitis or plantar fasciitis.  She denies any SI joint pain at this time.  She denies any active psoriasis.  Patient reports that she has been experiencing increased pain in both CMC joints, left greater than right.  Patient has been wearing a left CMC joint brace for support.  Patient states that her pain has been constant and has started to interfere with her activities.    Activities of Daily Living:  Patient reports morning stiffness for 1-2 hours.   Patient Reports nocturnal pain.  Difficulty dressing/grooming: Denies Difficulty climbing stairs: Denies Difficulty getting out of chair: Denies Difficulty using hands for taps, buttons, cutlery, and/or writing: Denies  Review of Systems  Constitutional:  Positive for fatigue.  HENT:  Negative for mouth sores and mouth dryness.   Eyes:  Negative for dryness.  Respiratory:  Negative for shortness of breath.   Cardiovascular:  Negative for chest pain and palpitations.  Gastrointestinal:  Negative for blood in stool, constipation and diarrhea.  Endocrine: Negative for increased urination.  Genitourinary:  Negative for involuntary urination.  Musculoskeletal:  Positive for joint pain,  joint pain, muscle weakness, morning stiffness and muscle tenderness. Negative for gait problem, joint swelling, myalgias and myalgias.  Skin:  Negative for color change, rash, hair loss and sensitivity to sunlight.  Allergic/Immunologic: Negative for susceptible to infections.  Neurological:  Positive for headaches. Negative for dizziness.  Hematological:  Negative for swollen glands.  Psychiatric/Behavioral:  Negative for depressed mood and sleep disturbance. The patient is not nervous/anxious.     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    Past Medical History:  Diagnosis Date   Anxiety    Arthritis    GERD (gastroesophageal reflux disease)    History of kidney stones    Pelvic mass    RA (rheumatoid arthritis) (HCC)    UTI (urinary tract infection) 11/09/2018    Family History  Problem Relation Age of Onset   Heart disease Father    Arthritis Father    Psoriasis Father    Parkinsonism Father    Other Father  65       pacemaker   Kidney disease Maternal Uncle    Stroke Maternal Grandmother    Heart failure Paternal Grandmother    Colon cancer Paternal Grandfather    Esophageal cancer Neg Hx    Rectal cancer Neg Hx    Stomach cancer Neg Hx    Past Surgical History:  Procedure Laterality Date   ABDOMINAL HYSTERECTOMY     APPENDECTOMY     BACK SURGERY     CHOLECYSTECTOMY     EXPLORATORY LAPAROTOMY     GASTRIC BYPASS     KNEE ARTHROSCOPY     LITHOTRIPSY     REPLACEMENT TOTAL KNEE Left  12/26/2020   Social History   Social History Narrative   Daily caffeine    Immunization History  Administered Date(s) Administered   PFIZER(Purple Top)SARS-COV-2 Vaccination 07/27/2019, 08/17/2019, 02/02/2020     Objective: Vital Signs: BP 137/76 (BP Location: Left Arm, Patient Position: Sitting, Cuff Size: Normal)   Pulse 85   Resp 17   Ht 5\' 5"  (1.651 m)   Wt 243 lb 12.8 oz (110.6 kg)   BMI 40.57 kg/m    Physical Exam Vitals and nursing note reviewed.  Constitutional:      Appearance: She is well-developed.  HENT:     Head: Normocephalic and atraumatic.  Eyes:     Conjunctiva/sclera: Conjunctivae normal.  Cardiovascular:     Rate and Rhythm: Normal rate and regular rhythm.     Heart sounds: Normal heart sounds.  Pulmonary:     Effort: Pulmonary effort is normal.     Breath sounds: Normal breath sounds.  Abdominal:     General: Bowel sounds are normal.     Palpations: Abdomen is soft.  Musculoskeletal:     Cervical back: Normal range of motion.  Lymphadenopathy:     Cervical: No cervical adenopathy.  Skin:    General: Skin is warm and dry.     Capillary Refill: Capillary refill takes less than 2 seconds.  Neurological:     Mental Status: She is alert and oriented to person, place, and time.  Psychiatric:        Behavior: Behavior normal.      Musculoskeletal Exam: C-spine, thoracic spine, lumbar spine good range of motion.  No midline spinal tenderness.  No SI joint tenderness upon palpation.  Shoulder joints, elbow joints, wrist joints, MCPs, PIPs, DIPs have good range of motion with no synovitis.  Tenderness and thickening of bilateral CMC joints, left worse than right.  PIP and DIP thickening consistent with osteoarthritis of both hands.  Hip joints have good range of motion with no groin pain.  Left knee replacement has good range of motion with no effusion.  Right knee joint has good range of motion with no warmth or effusion.  Ankle joints have good range of  motion with no tenderness or joint swelling.  CDAI Exam: CDAI Score: -- Patient Global: --; Provider Global: -- Swollen: --; Tender: -- Joint Exam 10/15/2023   No joint exam has been documented for this visit   There is currently no information documented on the homunculus. Go to the Rheumatology activity and complete the homunculus joint exam.  Investigation: No additional findings.  Imaging: No results found.  Recent Labs: Lab Results  Component Value Date   WBC 5.4 07/25/2023   HGB 13.2 07/25/2023   PLT 301 07/25/2023   NA 144 07/25/2023   K 5.1 07/25/2023   CL 105 07/25/2023   CO2  30 07/25/2023   GLUCOSE 107 (H) 07/25/2023   BUN 12 07/25/2023   CREATININE 0.68 07/25/2023   BILITOT 0.3 07/25/2023   ALKPHOS 94 11/09/2018   AST 22 07/25/2023   ALT 24 07/25/2023   PROT 7.2 07/25/2023   ALBUMIN 3.0 (L) 11/09/2018   CALCIUM 9.7 07/25/2023   GFRAA 95 06/26/2020   QFTBGOLDPLUS NEGATIVE 10/24/2022    Speciality Comments: No specialty comments available.  Procedures:  Small Joint Inj: L thumb CMC on 10/15/2023 3:54 PM Indications: pain Details: 27 G needle, ultrasound-guided radial approach Medications: 0.5 mL lidocaine  1 %; 20 mg triamcinolone  acetonide 40 MG/ML Aspirate: 0 mL Outcome: tolerated well, no immediate complications Procedure, treatment alternatives, risks and benefits explained, specific risks discussed. Consent was given by the patient. Immediately prior to procedure a time out was called to verify the correct patient, procedure, equipment, support staff and site/side marked as required. Patient was prepped and draped in the usual sterile fashion.     Allergies: Dilaudid [hydromorphone hcl], Penicillins, Morphine sulfate, Penicillin v potassium, and Sulfamethoxazole-trimethoprim   Assessment / Plan:     Visit Diagnoses: Psoriatic arthropathy (HCC): No synovitis or dactylitis was noted on examination today.  She has not had any signs or symptoms of a  psoriatic arthritis flare.  She has no active psoriasis at this time.  No SI joint tenderness upon palpation.  No evidence of Achilles tendinitis or plantar fasciitis.  Overall she has clinically been doing well on Cosentyx  300 mg subcutaneous injections every 28 days.  She is tolerating Cosentyx  without any side effects or injection site reactions.  No recent gaps in therapy.  She will remain on Cosentyx  as prescribed.  She was advised to notify us  if she develops signs or symptoms of a flare.  She will follow-up in the office in 5 months or sooner if needed.  Psoriasis: She has no active psoriasis at this time.  She will remain on Cosentyx  as prescribed.  High risk medication use - Cosentyx  300 mg sq injections every 28 days.  CBC and CMP updated on 07/25/23.  Orders for CBC and CMP were released today. TB gold negative on 10/24/22.  Order for TB gold released today. Discussed the importance of holding cosentyx  if she develops signs or symptoms of an infection and to resume once the infection has completely cleared.   - Plan: CBC with Differential/Platelet, Comprehensive metabolic panel with GFR, QuantiFERON-TB Gold Plus  Screening for tuberculosis -Order for TB gold released today.  Plan: QuantiFERON-TB Gold Plus  Trigger finger, left middle finger: Resolved.  Trigger index finger of left hand - Resolved.  Ultrasound-guided injection performed on 07/18/2022 has alleviated her symptoms.  Primary osteoarthritis of both hands: Patient presents today with bilateral CMC joint pain, left greater than right.  Her discomfort has been constant to the point that is starting to interfere with her activities.  She has tried using a left [facility] joint brace at times which has been helpful.  Different treatment options were discussed.  After informed consent the left [facility] joint was injected under ultrasound guidance.  She tolerated procedure well.  Procedure note was completed above.  Aftercare was discussed.  Pain in  left wrist -Chronic left CMC joint pain--using a brace as needed.  Constant pain.  Interfering with activities due to the severity of pain.  Different treatment options were discussed. An ultrasound guided left CMC joint injection will be performed today.  She tolerated the procedure well and the procedure note was completed  above.  Aftercare was discussed.  She was vies to notify us  if her symptoms persist or worsen.  Plan: US  Guided Needle Placement  Primary osteoarthritis of right knee: No warmth or effusion noted.   S/P TKR (total knee replacement), left - Performed on 12/26/2020 by Dr. Rossie Coon. Doing well.  No effusion noted.   DDD lumbar spine: No symptoms of radiculopathy.    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  History of gastroesophageal reflux (GERD)  History of anxiety  History of depression    Orders: Orders Placed This Encounter  Procedures   Small Joint Inj   US  Guided Needle Placement   CBC with Differential/Platelet   Comprehensive metabolic panel with GFR   QuantiFERON-TB Gold Plus   No orders of the defined types were placed in this encounter.    Follow-Up Instructions: Return in about 5 months (around 03/16/2024) for Psoriatic arthritis.   Romayne Clubs, PA-C  Note - This record has been created using Dragon software.  Chart creation errors have been sought, but may not always  have been located. Such creation errors do not reflect on  the standard of medical care.

## 2023-10-15 ENCOUNTER — Ambulatory Visit

## 2023-10-15 ENCOUNTER — Ambulatory Visit: Attending: Physician Assistant | Admitting: Physician Assistant

## 2023-10-15 ENCOUNTER — Encounter: Payer: Self-pay | Admitting: Physician Assistant

## 2023-10-15 VITALS — BP 137/76 | HR 85 | Resp 17 | Ht 65.0 in | Wt 243.8 lb

## 2023-10-15 DIAGNOSIS — M1711 Unilateral primary osteoarthritis, right knee: Secondary | ICD-10-CM | POA: Diagnosis present

## 2023-10-15 DIAGNOSIS — L409 Psoriasis, unspecified: Secondary | ICD-10-CM | POA: Diagnosis present

## 2023-10-15 DIAGNOSIS — M47816 Spondylosis without myelopathy or radiculopathy, lumbar region: Secondary | ICD-10-CM

## 2023-10-15 DIAGNOSIS — Z111 Encounter for screening for respiratory tuberculosis: Secondary | ICD-10-CM | POA: Diagnosis present

## 2023-10-15 DIAGNOSIS — M19042 Primary osteoarthritis, left hand: Secondary | ICD-10-CM

## 2023-10-15 DIAGNOSIS — M65332 Trigger finger, left middle finger: Secondary | ICD-10-CM | POA: Diagnosis present

## 2023-10-15 DIAGNOSIS — Z96652 Presence of left artificial knee joint: Secondary | ICD-10-CM | POA: Diagnosis present

## 2023-10-15 DIAGNOSIS — Z8659 Personal history of other mental and behavioral disorders: Secondary | ICD-10-CM | POA: Diagnosis present

## 2023-10-15 DIAGNOSIS — M19041 Primary osteoarthritis, right hand: Secondary | ICD-10-CM | POA: Diagnosis present

## 2023-10-15 DIAGNOSIS — Z79899 Other long term (current) drug therapy: Secondary | ICD-10-CM

## 2023-10-15 DIAGNOSIS — M65322 Trigger finger, left index finger: Secondary | ICD-10-CM

## 2023-10-15 DIAGNOSIS — L4050 Arthropathic psoriasis, unspecified: Secondary | ICD-10-CM | POA: Diagnosis present

## 2023-10-15 DIAGNOSIS — M25532 Pain in left wrist: Secondary | ICD-10-CM | POA: Diagnosis present

## 2023-10-15 DIAGNOSIS — Z8719 Personal history of other diseases of the digestive system: Secondary | ICD-10-CM

## 2023-10-15 DIAGNOSIS — Z9884 Bariatric surgery status: Secondary | ICD-10-CM | POA: Diagnosis present

## 2023-10-15 DIAGNOSIS — E559 Vitamin D deficiency, unspecified: Secondary | ICD-10-CM

## 2023-10-15 MED ORDER — TRIAMCINOLONE ACETONIDE 40 MG/ML IJ SUSP
20.0000 mg | INTRAMUSCULAR | Status: AC | PRN
  Administered 2023-10-15: 20 mg via INTRA_ARTICULAR

## 2023-10-15 MED ORDER — LIDOCAINE HCL 1 % IJ SOLN
0.5000 mL | INTRAMUSCULAR | Status: AC | PRN
  Administered 2023-10-15: .5 mL

## 2023-10-16 ENCOUNTER — Ambulatory Visit: Payer: Self-pay | Admitting: Physician Assistant

## 2023-10-16 NOTE — Progress Notes (Signed)
 CBC and CMP WNL

## 2023-10-17 LAB — COMPREHENSIVE METABOLIC PANEL WITH GFR
AG Ratio: 1.4 (calc) (ref 1.0–2.5)
ALT: 25 U/L (ref 6–29)
AST: 23 U/L (ref 10–35)
Albumin: 4.1 g/dL (ref 3.6–5.1)
Alkaline phosphatase (APISO): 90 U/L (ref 37–153)
BUN: 14 mg/dL (ref 7–25)
CO2: 29 mmol/L (ref 20–32)
Calcium: 9.5 mg/dL (ref 8.6–10.4)
Chloride: 106 mmol/L (ref 98–110)
Creat: 0.65 mg/dL (ref 0.50–1.05)
Globulin: 2.9 g/dL (ref 1.9–3.7)
Glucose, Bld: 95 mg/dL (ref 65–99)
Potassium: 4.2 mmol/L (ref 3.5–5.3)
Sodium: 141 mmol/L (ref 135–146)
Total Bilirubin: 0.3 mg/dL (ref 0.2–1.2)
Total Protein: 7 g/dL (ref 6.1–8.1)
eGFR: 96 mL/min/{1.73_m2} (ref >=60)

## 2023-10-17 LAB — QUANTIFERON-TB GOLD PLUS
Mitogen-NIL: 9.81 [IU]/mL
NIL: 0.03 [IU]/mL
QuantiFERON-TB Gold Plus: NEGATIVE
TB1-NIL: 0 [IU]/mL
TB2-NIL: <0 [IU]/mL

## 2023-10-17 LAB — CBC WITH DIFFERENTIAL/PLATELET
Absolute Lymphocytes: 2595 {cells}/uL (ref 850–3900)
Absolute Monocytes: 816 {cells}/uL (ref 200–950)
Basophils Absolute: 108 {cells}/uL (ref 0–200)
Basophils Relative: 1.4 %
Eosinophils Absolute: 208 {cells}/uL (ref 15–500)
Eosinophils Relative: 2.7 %
HCT: 37.8 % (ref 35.0–45.0)
Hemoglobin: 12.4 g/dL (ref 11.7–15.5)
MCH: 27.4 pg (ref 27.0–33.0)
MCHC: 32.8 g/dL (ref 32.0–36.0)
MCV: 83.4 fL (ref 80.0–100.0)
MPV: 11 fL (ref 7.5–12.5)
Monocytes Relative: 10.6 %
Neutro Abs: 3973 {cells}/uL (ref 1500–7800)
Neutrophils Relative %: 51.6 %
Platelets: 323 10*3/uL (ref 140–400)
RBC: 4.53 10*6/uL (ref 3.80–5.10)
RDW: 13.6 % (ref 11.0–15.0)
Total Lymphocyte: 33.7 %
WBC: 7.7 10*3/uL (ref 3.8–10.8)

## 2023-10-20 NOTE — Progress Notes (Signed)
 TB gold negative

## 2023-10-25 ENCOUNTER — Other Ambulatory Visit: Payer: Self-pay | Admitting: Physician Assistant

## 2023-10-25 DIAGNOSIS — L409 Psoriasis, unspecified: Secondary | ICD-10-CM

## 2023-10-25 DIAGNOSIS — L4050 Arthropathic psoriasis, unspecified: Secondary | ICD-10-CM

## 2023-10-27 NOTE — Telephone Encounter (Signed)
 Last Fill: 08/11/2023  Labs: 10/15/2023 CBC and CMP WNL   TB Gold: 10/15/2023 TB Gold Negative   Next Visit: 03/17/2024  Last Visit: 10/15/2023  GU:[id number] arthropathy   Current Dose per office note 10/15/2023: Cosentyx  300 mg sq injections every 28 days   Okay to refill Cosentyx ?

## 2024-01-02 ENCOUNTER — Other Ambulatory Visit: Payer: Self-pay | Admitting: Physician Assistant

## 2024-01-02 DIAGNOSIS — L4050 Arthropathic psoriasis, unspecified: Secondary | ICD-10-CM

## 2024-01-02 DIAGNOSIS — L409 Psoriasis, unspecified: Secondary | ICD-10-CM

## 2024-01-02 NOTE — Telephone Encounter (Signed)
 Last Fill: 10/27/2023  Labs: 10/15/2023 CBC and CMP WNL   TB Gold: 10/15/2023 TB gold negative   Next Visit: 03/17/2024  Last Visit: 10/15/2023  IK:Ednmpjupr arthropathy (HCC)   Current Dose per office note 10/15/2023: Cosentyx  300 mg sq injections every 28 days.   Okay to refill Cosentyx ?

## 2024-01-23 ENCOUNTER — Other Ambulatory Visit: Payer: Self-pay | Admitting: Physician Assistant

## 2024-01-23 DIAGNOSIS — L4050 Arthropathic psoriasis, unspecified: Secondary | ICD-10-CM

## 2024-01-23 DIAGNOSIS — L409 Psoriasis, unspecified: Secondary | ICD-10-CM

## 2024-01-23 NOTE — Telephone Encounter (Signed)
 Last Fill: 01/02/2024 (30 day supply)   Labs: 10/15/2023 CBC and CMP WNL    TB Gold: 10/15/2023 TB gold negative    Next Visit: 03/17/2024   Last Visit: 10/15/2023   IK:Ednmpjupr arthropathy (HCC)    Current Dose per office note 10/15/2023: Cosentyx  300 mg sq injections every 28 days.  Patient advised she is due to update lab work.     Okay to refill Cosentyx ?

## 2024-01-26 ENCOUNTER — Other Ambulatory Visit: Payer: Self-pay | Admitting: *Deleted

## 2024-01-26 DIAGNOSIS — Z79899 Other long term (current) drug therapy: Secondary | ICD-10-CM

## 2024-01-26 DIAGNOSIS — Z111 Encounter for screening for respiratory tuberculosis: Secondary | ICD-10-CM

## 2024-01-26 DIAGNOSIS — Z9225 Personal history of immunosupression therapy: Secondary | ICD-10-CM

## 2024-01-26 LAB — CBC WITH DIFFERENTIAL/PLATELET
Absolute Lymphocytes: 2640 {cells}/uL (ref 850–3900)
Absolute Monocytes: 800 {cells}/uL (ref 200–950)
Basophils Absolute: 96 {cells}/uL (ref 0–200)
Basophils Relative: 1.2 %
Eosinophils Absolute: 248 {cells}/uL (ref 15–500)
Eosinophils Relative: 3.1 %
HCT: 43 % (ref 35.0–45.0)
Hemoglobin: 14 g/dL (ref 11.7–15.5)
MCH: 27.7 pg (ref 27.0–33.0)
MCHC: 32.6 g/dL (ref 32.0–36.0)
MCV: 85.1 fL (ref 80.0–100.0)
MPV: 11.1 fL (ref 7.5–12.5)
Monocytes Relative: 10 %
Neutro Abs: 4216 {cells}/uL (ref 1500–7800)
Neutrophils Relative %: 52.7 %
Platelets: 312 Thousand/uL (ref 140–400)
RBC: 5.05 Million/uL (ref 3.80–5.10)
RDW: 13.6 % (ref 11.0–15.0)
Total Lymphocyte: 33 %
WBC: 8 Thousand/uL (ref 3.8–10.8)

## 2024-01-26 LAB — COMPREHENSIVE METABOLIC PANEL WITH GFR
AG Ratio: 1.5 (calc) (ref 1.0–2.5)
ALT: 28 U/L (ref 6–29)
AST: 25 U/L (ref 10–35)
Albumin: 4.3 g/dL (ref 3.6–5.1)
Alkaline phosphatase (APISO): 93 U/L (ref 37–153)
BUN: 14 mg/dL (ref 7–25)
CO2: 28 mmol/L (ref 20–32)
Calcium: 9.6 mg/dL (ref 8.6–10.4)
Chloride: 105 mmol/L (ref 98–110)
Creat: 0.67 mg/dL (ref 0.50–1.05)
Globulin: 2.9 g/dL (ref 1.9–3.7)
Glucose, Bld: 93 mg/dL (ref 65–99)
Potassium: 5.3 mmol/L (ref 3.5–5.3)
Sodium: 142 mmol/L (ref 135–146)
Total Bilirubin: 0.3 mg/dL (ref 0.2–1.2)
Total Protein: 7.2 g/dL (ref 6.1–8.1)
eGFR: 95 mL/min/1.73m2 (ref >=60)

## 2024-01-27 ENCOUNTER — Ambulatory Visit: Payer: Self-pay | Admitting: Rheumatology

## 2024-01-27 NOTE — Progress Notes (Signed)
 CBC and CMP are normal.

## 2024-02-13 ENCOUNTER — Other Ambulatory Visit: Payer: Self-pay | Admitting: Physician Assistant

## 2024-02-13 DIAGNOSIS — L409 Psoriasis, unspecified: Secondary | ICD-10-CM

## 2024-02-13 DIAGNOSIS — L4050 Arthropathic psoriasis, unspecified: Secondary | ICD-10-CM

## 2024-02-13 NOTE — Telephone Encounter (Signed)
 Last Fill: 01/23/2024  Labs: 01/26/2024 CBC and CMP are normal.   TB Gold: 10/15/2023 Neg    Next Visit: 03/17/2024  Last Visit: 10/15/2023  DX: Psoriatic arthropathy   Current Dose per office note 10/15/2023: Cosentyx  300 mg sq injections every 28 days.   Okay to refill Cosentyx ?

## 2024-03-03 NOTE — Progress Notes (Signed)
 Office Visit Note  Patient: Cassidy Reeves             Date of Birth: 04-17-1956           MRN: [id number]             PCP: Vernadine Charlie ORN, MD Referring: Vernadine Charlie ORN, MD Visit Date: 03/17/2024 Occupation: Data Unavailable  Subjective:  Medication management  History of Present Illness: Cassidy Reeves is a 68 y.o. female with psoriatic arthritis and psoriasis.  She returns today after her last visit on Oct 15, 2023.  She had left CMC injection on Oct 15, 2023.  She noted improvement after the cortisone injection.  She is still have some discomfort in her left CMC joint.  She has been on Cosentyx  300 mg subcu every 28 days.  She denies any history of Achilles tendinitis or plantar fasciitis.  She denies flare of psoriatic arthritis or psoriasis since the last visit.    Activities of Daily Living:  Patient reports morning stiffness for a few minutes.   Patient Denies nocturnal pain.  Difficulty dressing/grooming: Denies Difficulty climbing stairs: Reports Difficulty getting out of chair: Denies Difficulty using hands for taps, buttons, cutlery, and/or writing: Denies  Review of Systems  Constitutional:  Negative for fatigue.  HENT:  Negative for mouth sores and mouth dryness.   Eyes:  Positive for dryness.  Respiratory:  Negative for shortness of breath.   Cardiovascular:  Negative for chest pain and palpitations.  Gastrointestinal:  Negative for blood in stool, constipation and diarrhea.  Endocrine: Negative for increased urination.  Genitourinary:  Negative for involuntary urination.  Musculoskeletal:  Positive for joint pain, joint pain and morning stiffness. Negative for gait problem, joint swelling, myalgias, muscle weakness, muscle tenderness and myalgias.  Skin:  Negative for color change, rash, hair loss and sensitivity to sunlight.  Allergic/Immunologic: Negative for susceptible to infections.  Neurological:  Positive for headaches. Negative for dizziness.   Hematological:  Negative for swollen glands.  Psychiatric/Behavioral:  Negative for depressed mood and sleep disturbance. The patient is not nervous/anxious.     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    Past Medical History:  Diagnosis Date   Anxiety    Arthritis    GERD (gastroesophageal reflux disease)    History of kidney stones    Pelvic mass    RA (rheumatoid arthritis) (HCC)    UTI (urinary tract infection) 11/09/2018    Family History  Problem Relation Age of Onset   Heart disease Father    Arthritis Father    Psoriasis Father    Parkinsonism Father    Other Father 30       pacemaker   Kidney disease Maternal Uncle    Stroke Maternal Grandmother    Heart failure Paternal Grandmother    Colon cancer Paternal Grandfather    Esophageal cancer Neg Hx    Rectal cancer Neg Hx    Stomach cancer Neg Hx    Past Surgical History:  Procedure Laterality Date   ABDOMINAL HYSTERECTOMY     APPENDECTOMY     BACK SURGERY     CHOLECYSTECTOMY     EXPLORATORY LAPAROTOMY     GASTRIC BYPASS     KNEE ARTHROSCOPY     LITHOTRIPSY     REPLACEMENT TOTAL KNEE Left 12/26/2020   Social History   Tobacco Use   Smoking status: Never    Passive exposure: Never   Smokeless tobacco: Never  Vaping Use   Vaping status: Never Used  Substance Use Topics   Alcohol use: Yes    Comment: occasionally   Drug use: Never   Social History   Social History Narrative   Daily caffeine      Immunization  History  Administered Date(s) Administered   PFIZER(Purple Top)SARS-COV-2 Vaccination 07/27/2019, 08/17/2019, 02/02/2020     Objective: Vital Signs: BP 118/78   Pulse 77   Temp 98.2 F (36.8 C)   Resp 17   Ht 5' 5 (1.651 m)   Wt 217 lb 6.4 oz (98.6 kg)   BMI 36.18 kg/m    Physical Exam Vitals and nursing note reviewed.  Constitutional:      Appearance: She is well-developed.  HENT:     Head: Normocephalic and atraumatic.  Eyes:     Conjunctiva/sclera: Conjunctivae normal.  Cardiovascular:     Rate and Rhythm: Normal rate and regular rhythm.     Heart sounds: Normal heart sounds.  Pulmonary:     Effort: Pulmonary effort is normal.     Breath sounds: Normal breath sounds.  Abdominal:     General: Bowel sounds are normal.     Palpations: Abdomen is soft.  Musculoskeletal:     Cervical back: Normal range of motion.  Lymphadenopathy:     Cervical: No cervical adenopathy.  Skin:    General: Skin is warm and dry.     Capillary Refill: Capillary refill takes less than 2 seconds.  Neurological:     Mental Status: She is alert and oriented to person, place, and time.  Psychiatric:        Behavior: Behavior normal.      Musculoskeletal Exam: Cervical, thoracic and lumbar spine were in good range of motion.  She had no SI joint tenderness.  Shoulders, elbows, wrist joints, MCPs PIPs and DIPs were in good range of motion.  She had bilateral CMC, PIP and DIP thickening with no synovitis.  Hip joints were in good range of motion.  Left knee joint was replaced without any warmth swelling or effusion.  Right knee joint was good range of motion without any discomfort.  There was no tenderness over Achilles tendon or plantar fascia.  There was no tenderness over MTPs or ankles.  CDAI Exam: CDAI Score: -- Patient Global: --; Provider Global: -- Swollen: --; Tender: -- Joint Exam 03/17/2024   No joint exam has been documented for this visit   There is currently no information  documented on the homunculus. Go to the Rheumatology activity and complete the homunculus joint exam.  Investigation: No additional findings.  Imaging: No results found.  Recent Labs: Lab Results  Component Value Date   WBC 8.0 01/26/2024   HGB 14.0 01/26/2024   PLT 312 01/26/2024   NA 142 01/26/2024   K 5.3 01/26/2024   CL 105 01/26/2024   CO2 28 01/26/2024   GLUCOSE 93 01/26/2024   BUN 14 01/26/2024   CREATININE 0.67 01/26/2024   BILITOT 0.3 01/26/2024   ALKPHOS 94 11/09/2018  AST 25 01/26/2024   ALT 28 01/26/2024   PROT 7.2 01/26/2024   ALBUMIN 3.0 (L) 11/09/2018   CALCIUM 9.6 01/26/2024   GFRAA 95 06/26/2020   QFTBGOLDPLUS NEGATIVE 10/15/2023    Speciality Comments: No specialty comments available.  Procedures:  No procedures performed Allergies: Dilaudid [hydromorphone hcl], Penicillins, Morphine sulfate, Penicillin v potassium, and Sulfamethoxazole-trimethoprim   Assessment / Plan:     Visit Diagnoses: Psoriatic arthropathy (HCC)-she denies having a flare of psoriasis or psoriatic arthritis.  No synovitis was noted on the examination today.  She had no SI joint tenderness.  There was no plantar fasciitis or Achilles tendinitis.  She remains on Cosyntex subcutaneous every 28 days without any interruption.  Psoriasis-she had no active psoriasis patches.  High risk medication use - Cosentyx  300 mg sq injections every 28 days.  January 26, 2024 CBC and CMP were normal.  TB Gold was negative on Oct 15, 2023.  She was advised to get labs every 3 months.  Information regarding physician was placed in the AVS.  TB Gold will be done annually.  She was advised to hold Cosentyx  if she develops an infection and resume after infection resolves.  Trigger finger, left middle finger - Resolved.  Primary osteoarthritis of both hands-she had good response to left CMC injection given at the last visit.  She states she has been using CMC brace.  She had bilateral PIP and DIP  thickening.  Joint protection muscle strengthening was discussed.  A handout on hand exercises was given.  Primary osteoarthritis of right knee-doing well without any discomfort.  S/P TKR (total knee replacement), left - Performed on 12/26/2020 by Dr. Hiram.  She had good range of motion.  DDD lumbar spine-she has intermittent discomfort.  She had no SI joint tenderness.  Vitamin D  deficiency-she takes vitamin D  supplement.  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  No orders of the defined types were placed in this encounter.    Follow-Up Instructions: Return in about 5 months (around 08/15/2024) for Psoriatic arthritis.   Maya Nash, MD  Note - This record has been created using Animal nutritionist.  Chart creation errors have been sought, but may not always  have been located. Such creation errors do not reflect on  the standard of medical care.

## 2024-03-17 ENCOUNTER — Ambulatory Visit: Attending: Rheumatology | Admitting: Rheumatology

## 2024-03-17 ENCOUNTER — Encounter: Payer: Self-pay | Admitting: Rheumatology

## 2024-03-17 VITALS — BP 118/78 | HR 77 | Temp 98.2°F | Resp 17 | Ht 65.0 in | Wt 217.4 lb

## 2024-03-17 DIAGNOSIS — M19042 Primary osteoarthritis, left hand: Secondary | ICD-10-CM | POA: Insufficient documentation

## 2024-03-17 DIAGNOSIS — Z9884 Bariatric surgery status: Secondary | ICD-10-CM | POA: Diagnosis present

## 2024-03-17 DIAGNOSIS — L409 Psoriasis, unspecified: Secondary | ICD-10-CM | POA: Diagnosis present

## 2024-03-17 DIAGNOSIS — M1711 Unilateral primary osteoarthritis, right knee: Secondary | ICD-10-CM | POA: Diagnosis present

## 2024-03-17 DIAGNOSIS — M65332 Trigger finger, left middle finger: Secondary | ICD-10-CM | POA: Insufficient documentation

## 2024-03-17 DIAGNOSIS — E559 Vitamin D deficiency, unspecified: Secondary | ICD-10-CM | POA: Insufficient documentation

## 2024-03-17 DIAGNOSIS — Z8659 Personal history of other mental and behavioral disorders: Secondary | ICD-10-CM | POA: Insufficient documentation

## 2024-03-17 DIAGNOSIS — M47816 Spondylosis without myelopathy or radiculopathy, lumbar region: Secondary | ICD-10-CM | POA: Insufficient documentation

## 2024-03-17 DIAGNOSIS — L4050 Arthropathic psoriasis, unspecified: Secondary | ICD-10-CM | POA: Diagnosis not present

## 2024-03-17 DIAGNOSIS — M65322 Trigger finger, left index finger: Secondary | ICD-10-CM

## 2024-03-17 DIAGNOSIS — Z8719 Personal history of other diseases of the digestive system: Secondary | ICD-10-CM | POA: Insufficient documentation

## 2024-03-17 DIAGNOSIS — Z79899 Other long term (current) drug therapy: Secondary | ICD-10-CM | POA: Insufficient documentation

## 2024-03-17 DIAGNOSIS — M19041 Primary osteoarthritis, right hand: Secondary | ICD-10-CM | POA: Diagnosis present

## 2024-03-17 DIAGNOSIS — M25532 Pain in left wrist: Secondary | ICD-10-CM

## 2024-03-17 DIAGNOSIS — Z96652 Presence of left artificial knee joint: Secondary | ICD-10-CM | POA: Insufficient documentation

## 2024-03-17 NOTE — Patient Instructions (Addendum)
 Standing Labs We placed an order today for your standing lab work.   Please have your standing labs drawn in  December and every 3 months  Please have your labs drawn 2 weeks prior to your appointment so that the provider can discuss your lab results at your appointment, if possible.  Please note that you may see your imaging and lab results in MyChart before we have reviewed them. We will contact you once all results are reviewed. Please allow our office up to 72 hours to thoroughly review all of the results before contacting the office for clarification of your results.  WALK-IN LAB HOURS  Monday through Thursday from 8:00 am -12:30 pm and 1:00 pm-4:30 pm and Friday from 8:00 am-12:00 pm.  Patients with office visits requiring labs will be seen before walk-in labs.  You may encounter longer than normal wait times. Please allow additional time. Wait times may be shorter on  Monday and Thursday afternoons.  We do not book appointments for walk-in labs. We appreciate your patience and understanding with our staff.   Labs are drawn by [facility]. Please bring your co-pay at the time of your lab draw.  You may receive a bill from [facility] for your lab work.  Please note if you are on Hydroxychloroquine and and an order has been placed for a Hydroxychloroquine level,  you will need to have it drawn 4 hours or more after your last dose.  If you wish to have your labs drawn at another location, please call the office 24 hours in advance so we can fax the orders.  The office is located at [address], [place], [place] [address]   If you have any questions regarding directions or hours of operation,  please call [phone].   As a reminder, please drink plenty of water prior to coming for your lab work. Thanks!   Vaccines You are taking a medication(s) that can suppress your immune system.  The following immunizations are recommended: Flu annually ( high dose) Covid-19   RSV Td/Tdap (tetanus, diphtheria, pertussis) every 10 years Pneumonia (Prevnar 15 then Pneumovax 23 at least 1 year apart.  Alternatively, can take Prevnar 20 without needing additional dose) Shingrix: 2 doses from 4 weeks to 6 months apart  Please check with your PCP to make sure you are up to date.   If you have signs or symptoms of an infection or start antibiotics: First, call your PCP for workup of your infection. Hold your medication through the infection, until you complete your antibiotics, and until symptoms resolve if you take the following: Injectable medication (Actemra, Benlysta, Cimzia, Cosentyx , Enbrel, Humira, Kevzara, Orencia, Remicade, Simponi, Stelara, Taltz, Tremfya) Methotrexate Leflunomide (Arava) Mycophenolate (Cellcept) Xeljanz, Olumiant, or Rinvoq   Hand Exercises Hand exercises can be helpful for almost anyone. They can strengthen your hands and improve flexibility and movement. The exercises can also increase blood flow to the hands. These results can make your work and daily tasks easier for you. Hand exercises can be especially helpful for people who have joint pain from arthritis or nerve damage from using their hands over and over. These exercises can also help people who injure a hand. Exercises Most of these hand exercises are gentle stretching and motion exercises. It is usually safe to do them often throughout the day. Warming up your hands before exercise may help reduce stiffness. You can do this with gentle massage or by placing your hands in warm water for 10-15 minutes.  It is normal to feel some stretching, pulling, tightness, or mild discomfort when you begin new exercises. In time, this will improve. Remember to always be careful and stop right away if you feel sudden, very bad pain or your pain gets worse. You want to get better and be safe. Ask your health care provider which exercises are safe for you. Do exercises exactly as told by your provider  and adjust them as told. Do not begin these exercises until told by your provider. Knuckle bend or claw fist  Stand or sit with your arm, hand, and all five fingers pointed straight up. Make sure to keep your wrist straight. Gently bend your fingers down toward your palm until the tips of your fingers are touching your palm. Keep your big knuckle straight and only bend the small knuckles in your fingers. Hold this position for 10 seconds. Straighten your fingers back to your starting position. Repeat this exercise 5-10 times with each hand. Full finger fist  Stand or sit with your arm, hand, and all five fingers pointed straight up. Make sure to keep your wrist straight. Gently bend your fingers into your palm until the tips of your fingers are touching the middle of your palm. Hold this position for 10 seconds. Extend your fingers back to your starting position, stretching every joint fully. Repeat this exercise 5-10 times with each hand. Straight fist  Stand or sit with your arm, hand, and all five fingers pointed straight up. Make sure to keep your wrist straight. Gently bend your fingers at the big knuckle, where your fingers meet your hand, and at the middle knuckle. Keep the knuckle at the tips of your fingers straight and try to touch the bottom of your palm. Hold this position for 10 seconds. Extend your fingers back to your starting position, stretching every joint fully. Repeat this exercise 5-10 times with each hand. Tabletop  Stand or sit with your arm, hand, and all five fingers pointed straight up. Make sure to keep your wrist straight. Gently bend your fingers at the big knuckle, where your fingers meet your hand, as far down as you can. Keep the small knuckles in your fingers straight. Think of forming a tabletop with your fingers. Hold this position for 10 seconds. Extend your fingers back to your starting position, stretching every joint fully. Repeat this exercise  5-10 times with each hand. Finger spread  Place your hand flat on a table with your palm facing down. Make sure your wrist stays straight. Spread your fingers and thumb apart from each other as far as you can until you feel a gentle stretch. Hold this position for 10 seconds. Bring your fingers and thumb tight together again. Hold this position for 10 seconds. Repeat this exercise 5-10 times with each hand. Making circles  Stand or sit with your arm, hand, and all five fingers pointed straight up. Make sure to keep your wrist straight. Make a circle by touching the tip of your thumb to the tip of your index finger. Hold for 10 seconds. Then open your hand wide. Repeat this motion with your thumb and each of your fingers. Repeat this exercise 5-10 times with each hand. Thumb motion  Sit with your forearm resting on a table and your wrist straight. Your thumb should be facing up toward the ceiling. Keep your fingers relaxed as you move your thumb. Lift your thumb up as high as you can toward the ceiling. Hold for 10 seconds. [employer]  your thumb across your palm as far as you can, reaching the tip of your thumb for the small finger (pinkie) side of your palm. Hold for 10 seconds. Repeat this exercise 5-10 times with each hand. Grip strengthening  Hold a stress ball or other soft ball in the middle of your hand. Slowly increase the pressure, squeezing the ball as much as you can without causing pain. Think of bringing the tips of your fingers into the middle of your palm. All of your finger joints should bend when doing this exercise. Hold your squeeze for 10 seconds, then relax. Repeat this exercise 5-10 times with each hand. Contact a health care provider if: Your hand pain or discomfort gets much worse when you do an exercise. Your hand pain or discomfort does not improve within 2 hours after you exercise. If you have either of these problems, stop doing these exercises right away. Do not do  them again unless your provider says that you can. Get help right away if: You develop sudden, severe hand pain or swelling. If this happens, stop doing these exercises right away. Do not do them again unless your provider says that you can. This information is not intended to replace advice given to you by your health care provider. Make sure you discuss any questions you have with your health care provider. Document Revised: 05/21/2022 Document Reviewed: 05/21/2022 Elsevier Patient Education  2024 [employer].

## 2024-04-29 ENCOUNTER — Other Ambulatory Visit: Payer: Self-pay | Admitting: Rheumatology

## 2024-04-29 DIAGNOSIS — L409 Psoriasis, unspecified: Secondary | ICD-10-CM

## 2024-04-29 DIAGNOSIS — L4050 Arthropathic psoriasis, unspecified: Secondary | ICD-10-CM

## 2024-04-29 NOTE — Telephone Encounter (Signed)
 Last Fill: 02/13/2024  Labs: 01/26/2024 CBC and CMP are normal.   TB Gold: 10/15/2023 negative    Next Visit: 08/17/2024  Last Visit: 03/17/2024  DX: Psoriatic arthropathy   Current Dose per office note on 03/17/2024: Cosentyx  300 mg sq injections every 28 days.   Advised Patient that she is due to update labs, patient verbalized understanding. Standing lab orders are in place.   Okay to refill Cosentyx ?

## 2024-05-06 ENCOUNTER — Other Ambulatory Visit: Payer: Self-pay | Admitting: *Deleted

## 2024-05-06 DIAGNOSIS — Z79899 Other long term (current) drug therapy: Secondary | ICD-10-CM

## 2024-05-07 ENCOUNTER — Ambulatory Visit: Payer: Self-pay | Admitting: Rheumatology

## 2024-05-07 LAB — CBC WITH DIFFERENTIAL/PLATELET
Absolute Lymphocytes: 2659 {cells}/uL (ref 850–3900)
Absolute Monocytes: 828 {cells}/uL (ref 200–950)
Basophils Absolute: 92 {cells}/uL (ref 0–200)
Basophils Relative: 1 %
Eosinophils Absolute: 193 {cells}/uL (ref 15–500)
Eosinophils Relative: 2.1 %
HCT: 42 % (ref 35.9–46.0)
Hemoglobin: 13.5 g/dL (ref 11.7–15.5)
MCH: 27.2 pg (ref 27.0–33.0)
MCHC: 32.1 g/dL (ref 31.6–35.4)
MCV: 84.7 fL (ref 81.4–101.7)
MPV: 11 fL (ref 7.5–12.5)
Monocytes Relative: 9 %
Neutro Abs: 5428 {cells}/uL (ref 1500–7800)
Neutrophils Relative %: 59 %
Platelets: 335 Thousand/uL (ref 140–400)
RBC: 4.96 Million/uL (ref 3.80–5.10)
RDW: 13.1 % (ref 11.0–15.0)
Total Lymphocyte: 28.9 %
WBC: 9.2 Thousand/uL (ref 3.8–10.8)

## 2024-05-07 LAB — COMPREHENSIVE METABOLIC PANEL WITH GFR
AG Ratio: 1.5 (calc) (ref 1.0–2.5)
ALT: 20 U/L (ref 6–29)
AST: 19 U/L (ref 10–35)
Albumin: 4.3 g/dL (ref 3.6–5.1)
Alkaline phosphatase (APISO): 102 U/L (ref 37–153)
BUN: 15 mg/dL (ref 7–25)
CO2: 29 mmol/L (ref 20–32)
Calcium: 9.6 mg/dL (ref 8.6–10.4)
Chloride: 105 mmol/L (ref 98–110)
Creat: 0.75 mg/dL (ref 0.50–1.05)
Globulin: 2.9 g/dL (ref 1.9–3.7)
Glucose, Bld: 85 mg/dL (ref 65–99)
Potassium: 4.6 mmol/L (ref 3.5–5.3)
Sodium: 142 mmol/L (ref 135–146)
Total Bilirubin: 0.3 mg/dL (ref 0.2–1.2)
Total Protein: 7.2 g/dL (ref 6.1–8.1)
eGFR: 86 mL/min/1.73m2

## 2024-06-08 ENCOUNTER — Telehealth: Payer: Self-pay

## 2024-06-08 NOTE — Telephone Encounter (Signed)
 Patient called and states that [facility] dispensed 4 cosentyx  pens for her monthly supply and when she called, she was advised that is the way that the prescription was written. I verified instructions on the prescription with patient and patient has been checking the dose of the pens as well to confirm she has the correct dose. I advised patient we have not changed her dose.   Patient also states she received a letter from her insurance stating the cosentyx  needs a prior authorization.

## 2024-06-09 ENCOUNTER — Other Ambulatory Visit (HOSPITAL_COMMUNITY): Payer: Self-pay

## 2024-06-09 NOTE — Telephone Encounter (Signed)
 Received notification from [employer] regarding a prior authorization for COSENTYX  SQ. Authorization has been APPROVED from 06/09/2024 to 05/19/2025. Approval letter sent to scan center.  Authorization # [id number]  Sherry Pennant, PharmD, MPH, BCPS, CPP Clinical Pharmacist

## 2024-06-09 NOTE — Telephone Encounter (Signed)
 Submitted a Prior Authorization request to [employer] for COSENTYX  SQ via CoverMyMeds. Will update once we receive a response.  Key: [id number]

## 2024-08-17 ENCOUNTER — Ambulatory Visit: Admitting: Physician Assistant
